# Patient Record
Sex: Male | Born: 1940 | Race: White | Hispanic: No | Smoking: Former smoker
Health system: Southern US, Community
[De-identification: ages and names within clinical notes are randomized; demographics above are authoritative.]

## PROBLEM LIST (undated history)

## (undated) DIAGNOSIS — H409 Unspecified glaucoma: Secondary | ICD-10-CM

## (undated) DIAGNOSIS — E7800 Pure hypercholesterolemia, unspecified: Secondary | ICD-10-CM

## (undated) DIAGNOSIS — C4491 Basal cell carcinoma of skin, unspecified: Secondary | ICD-10-CM

## (undated) DIAGNOSIS — I219 Acute myocardial infarction, unspecified: Secondary | ICD-10-CM

## (undated) DIAGNOSIS — E079 Disorder of thyroid, unspecified: Secondary | ICD-10-CM

## (undated) DIAGNOSIS — H269 Unspecified cataract: Secondary | ICD-10-CM

## (undated) DIAGNOSIS — M1990 Unspecified osteoarthritis, unspecified site: Secondary | ICD-10-CM

## (undated) DIAGNOSIS — R296 Repeated falls: Secondary | ICD-10-CM

## (undated) DIAGNOSIS — I4891 Unspecified atrial fibrillation: Secondary | ICD-10-CM

## (undated) DIAGNOSIS — I10 Essential (primary) hypertension: Secondary | ICD-10-CM

## (undated) DIAGNOSIS — M069 Rheumatoid arthritis, unspecified: Secondary | ICD-10-CM

## (undated) HISTORY — PX: JOINT REPLACEMENT: SHX530

## (undated) HISTORY — PX: CORONARY ANGIOPLASTY WITH STENT PLACEMENT: SHX49

## (undated) HISTORY — DX: Unspecified glaucoma: H40.9

## (undated) HISTORY — PX: REPLACEMENT TOTAL KNEE: SUR1224

## (undated) HISTORY — DX: Basal cell carcinoma of skin, unspecified: C44.91

## (undated) HISTORY — DX: Rheumatoid arthritis, unspecified: M06.9

## (undated) HISTORY — DX: Unspecified cataract: H26.9

---

## 2017-01-02 ENCOUNTER — Encounter: Payer: Self-pay | Admitting: Internal Medicine

## 2017-01-15 ENCOUNTER — Encounter: Payer: Self-pay | Admitting: Nurse Practitioner

## 2017-01-24 ENCOUNTER — Ambulatory Visit: Payer: Self-pay | Admitting: Nurse Practitioner

## 2017-02-01 ENCOUNTER — Ambulatory Visit: Payer: Self-pay | Admitting: Nurse Practitioner

## 2017-02-13 ENCOUNTER — Telehealth: Payer: Self-pay | Admitting: Gastroenterology

## 2017-02-13 ENCOUNTER — Ambulatory Visit: Payer: Self-pay | Admitting: Gastroenterology

## 2017-02-13 ENCOUNTER — Encounter: Payer: Self-pay | Admitting: Gastroenterology

## 2017-02-13 NOTE — Telephone Encounter (Signed)
PATIENT WAS A NO SHOW AND LETTER SENT  °

## 2017-04-01 ENCOUNTER — Emergency Department (HOSPITAL_COMMUNITY)
Admission: EM | Admit: 2017-04-01 | Discharge: 2017-04-01 | Disposition: A | Discharge status: Home / Self Care | Payer: Medicare PPO | Attending: Emergency Medicine | Admitting: Emergency Medicine

## 2017-04-01 ENCOUNTER — Emergency Department (HOSPITAL_COMMUNITY): Payer: Medicare PPO

## 2017-04-01 ENCOUNTER — Encounter (HOSPITAL_COMMUNITY): Payer: Self-pay | Admitting: Emergency Medicine

## 2017-04-01 DIAGNOSIS — Y929 Unspecified place or not applicable: Secondary | ICD-10-CM | POA: Insufficient documentation

## 2017-04-01 DIAGNOSIS — I10 Essential (primary) hypertension: Secondary | ICD-10-CM | POA: Diagnosis not present

## 2017-04-01 DIAGNOSIS — W07XXXA Fall from chair, initial encounter: Secondary | ICD-10-CM | POA: Diagnosis not present

## 2017-04-01 DIAGNOSIS — S42031A Displaced fracture of lateral end of right clavicle, initial encounter for closed fracture: Secondary | ICD-10-CM | POA: Diagnosis not present

## 2017-04-01 DIAGNOSIS — S4991XA Unspecified injury of right shoulder and upper arm, initial encounter: Secondary | ICD-10-CM | POA: Diagnosis present

## 2017-04-01 DIAGNOSIS — Z87891 Personal history of nicotine dependence: Secondary | ICD-10-CM | POA: Insufficient documentation

## 2017-04-01 DIAGNOSIS — Y9389 Activity, other specified: Secondary | ICD-10-CM | POA: Diagnosis not present

## 2017-04-01 DIAGNOSIS — Y999 Unspecified external cause status: Secondary | ICD-10-CM | POA: Diagnosis not present

## 2017-04-01 DIAGNOSIS — S42001A Fracture of unspecified part of right clavicle, initial encounter for closed fracture: Secondary | ICD-10-CM

## 2017-04-01 HISTORY — DX: Acute myocardial infarction, unspecified: I21.9

## 2017-04-01 HISTORY — DX: Essential (primary) hypertension: I10

## 2017-04-01 HISTORY — DX: Unspecified atrial fibrillation: I48.91

## 2017-04-01 HISTORY — DX: Pure hypercholesterolemia, unspecified: E78.00

## 2017-04-01 HISTORY — DX: Unspecified osteoarthritis, unspecified site: M19.90

## 2017-04-01 HISTORY — DX: Disorder of thyroid, unspecified: E07.9

## 2017-04-01 MED ORDER — HYDROCODONE-ACETAMINOPHEN 5-325 MG PO TABS: 1.0000 | ORAL_TABLET | Freq: Four times a day (QID) | ORAL | 0 refills | Status: DC | PRN

## 2017-04-01 NOTE — ED Triage Notes (Signed)
Patient c/o right shoulder, right hip, and low back after falling today trying to get out of chair. Per family chair leg broke. Patient landed on carpeted floor. Denies hitting head or LOC. Patient unable to lift right arm.

## 2017-04-01 NOTE — ED Provider Notes (Signed)
[facility] DEPT Provider Note   CSN: [id number] Arrival date & time: 04/01/17  1543     History   Chief Complaint Chief Complaint  Patient presents with  . Fall    HPI [name] is a 76 y.o. male.  Patient fell out of a chair today complains of pain in his right shoulder lower back   The history is provided by the patient.  Fall  This is a new problem. The current episode started 6 to 12 hours ago. The problem occurs rarely. The problem has been resolved. Pertinent negatives include no chest pain, no abdominal pain and no headaches. Exacerbated by: Movement of shoulder. Nothing relieves the symptoms.    Past Medical History:  Diagnosis Date  . A-fib ([place])   . Arthritis   . High cholesterol   . Hypertension   . MI (myocardial infarction)   . Thyroid disease     There are no active problems to display for this patient.   Past Surgical History:  Procedure Laterality Date  . CORONARY ANGIOPLASTY WITH STENT PLACEMENT    . REPLACEMENT TOTAL KNEE Left        Home Medications    Prior to Admission medications   Medication Sig Start Date End Date Taking? Authorizing Provider  HYDROcodone-acetaminophen (NORCO/VICODIN) 5-325 MG tablet Take 1 tablet by mouth every 6 (six) hours as needed for moderate pain. 04/01/17   Milton Ferguson, MD    Family History History reviewed. No pertinent family history.  Social History Social History  Substance Use Topics  . Smoking status: Former Smoker    Years: 15.00    Types: Cigarettes    Quit date: 12/26/1975  . Smokeless tobacco: Never Used  . Alcohol use No     Allergies   Patient has no known allergies.   Review of Systems Review of Systems  Constitutional: Negative for appetite change and fatigue.  HENT: Negative for congestion, ear discharge and sinus pressure.   Eyes: Negative for discharge.  Respiratory: Negative for cough.   Cardiovascular: Negative for chest pain.  Gastrointestinal: Negative for  abdominal pain and diarrhea.  Genitourinary: Negative for frequency and hematuria.  Musculoskeletal: Negative for back pain.       Right shoulder pain  Skin: Negative for rash.  Neurological: Negative for seizures and headaches.  Psychiatric/Behavioral: Negative for hallucinations.     Physical Exam Updated Vital Signs BP 137/74 (BP Location: Left Arm)   Pulse 73   Temp 98.7 F (37.1 C) (Oral)   Resp 18   Ht 5\' 10"  (1.778 m)   Wt 185 lb (83.9 kg)   SpO2 94%   BMI 26.54 kg/m   Physical Exam  Constitutional: He is oriented to person, place, and time. He appears well-developed.  HENT:  Head: Normocephalic.  Eyes: Conjunctivae and EOM are normal. No scleral icterus.  Neck: Neck supple. No thyromegaly present.  Cardiovascular: Normal rate and regular rhythm.  Exam reveals no gallop and no friction rub.   No murmur heard. Pulmonary/Chest: No stridor. He has no wheezes. He has no rales. He exhibits no tenderness.  Abdominal: He exhibits no distension. There is no tenderness. There is no rebound.  Musculoskeletal: Normal range of motion. He exhibits no edema.  Tender right shoulder neurovascular exam normal in right arm. Mild tenderness lumbar spine  Lymphadenopathy:    He has no cervical adenopathy.  Neurological: He is oriented to person, place, and time. He exhibits normal muscle tone. Coordination normal.  Skin: No rash  noted. No erythema.  Psychiatric: He has a normal mood and affect. His behavior is normal.     ED Treatments / Results  Labs (all labs ordered are listed, but only abnormal results are displayed) Labs Reviewed - No data to display  EKG  EKG Interpretation None       Radiology Dg Lumbar Spine Complete  Result Date: 04/01/2017 CLINICAL DATA:  Fall out of chair today. Low back injury and pain. Initial encounter. EXAM: LUMBAR SPINE - COMPLETE 4+ VIEW COMPARISON:  None. FINDINGS: There is no evidence of acute lumbar spine fracture. Alignment is normal.  Severe degenerative disc disease is seen at L1-2 and L2-3. Mild facet DJD is seen bilaterally at L5-S1. No focal lytic or sclerotic bone lesions identified. Aortic atherosclerosis. Calcified gallstones also noted in the right upper quadrant. IMPRESSION: No acute findings.  Degenerative spondylosis, as described above. Incidentally noted aortic atherosclerosis and cholelithiasis. Electronically Signed   By: Earle Gell M.D.   On: 04/01/2017 16:47   Dg Shoulder Right  Result Date: 04/01/2017 CLINICAL DATA:  Fall.  Right shoulder pain. EXAM: RIGHT SHOULDER - 2+ VIEW COMPARISON:  None. FINDINGS: There is an oblique fracture through the lateral right clavicle, which probably extends to the articular surface, with 7 mm superior displacement of the dominant lateral fracture fragment and surrounding soft tissue swelling. No additional fracture. No evidence of dislocation at the right glenohumeral joint. 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 Small subacromial spur. No suspicious focal osseous lesion. No radiopaque foreign body. IMPRESSION: 1. Displaced fracture of the lateral right clavicle, probably extending laterally to the articular surface. 2. 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 Electronically Signed   By: Ilona Sorrel M.D.   On: 04/01/2017 16:45    Procedures Procedures (including critical care time)  Medications Ordered in ED Medications - No data to display   Initial Impression / Assessment and Plan / ED Course  I have reviewed the triage vital signs and the nursing notes.  Pertinent labs & imaging results that were available during my care of the patient were reviewed by me and considered in my medical decision making (see chart for details).     Patient with clavicle fracture and contusion to lumbar spine. He is given a sling Vicodin and will follow-up with orthopedics  Final Clinical Impressions(s) / ED Diagnoses   Final diagnoses:  Closed displaced  fracture of right clavicle, unspecified part of clavicle, initial encounter    New Prescriptions New Prescriptions   HYDROCODONE-ACETAMINOPHEN (NORCO/VICODIN) 5-325 MG TABLET    Take 1 tablet by mouth every 6 (six) hours as needed for moderate pain.     Milton Ferguson, MD 04/01/17 [phone]

## 2017-04-01 NOTE — Discharge Instructions (Signed)
Follow-up with Dr. Aline Brochure

## 2017-04-04 ENCOUNTER — Encounter: Payer: Self-pay | Admitting: Orthopaedic Surgery

## 2017-04-04 ENCOUNTER — Ambulatory Visit (INDEPENDENT_AMBULATORY_CARE_PROVIDER_SITE_OTHER): Payer: Medicare PPO | Admitting: Orthopaedic Surgery

## 2017-04-04 VITALS — BP 121/68 | HR 60 | Ht 67.0 in | Wt 184.0 lb

## 2017-04-04 DIAGNOSIS — S42001A Fracture of unspecified part of right clavicle, initial encounter for closed fracture: Secondary | ICD-10-CM | POA: Diagnosis not present

## 2017-04-04 NOTE — Progress Notes (Signed)
Subjective:    Patient ID: [name], male    DOB: September 23, 1941, 76 y.o.   MRN: [id number]  HPI He fell at home three days ago and hurt his right shoulder.  He tripped over a chair and had pain in the shoulder.  He was seen in the ER.  X-rays showed fracture of the right clavicle.  He has no other injury.  I have reviewed the x-rays and the report.   Review of Systems  HENT: Negative for congestion.   Respiratory: Negative for cough and shortness of breath.   Cardiovascular: Negative for chest pain and leg swelling.  Endocrine: Positive for cold intolerance.  Musculoskeletal: Positive for arthralgias, gait problem and joint swelling.  Allergic/Immunologic: Positive for environmental allergies.   Past Medical History:  Diagnosis Date  . A-fib ([place])   . Arthritis   . High cholesterol   . Hypertension   . MI (myocardial infarction)   . Rheumatoid arthritis ([place])   . Thyroid disease     Past Surgical History:  Procedure Laterality Date  . CORONARY ANGIOPLASTY WITH STENT PLACEMENT    . REPLACEMENT TOTAL KNEE Left     Current Outpatient Prescriptions on File Prior to Visit  Medication Sig Dispense Refill  . HYDROcodone-acetaminophen (NORCO/VICODIN) 5-325 MG tablet Take 1 tablet by mouth every 6 (six) hours as needed for moderate pain. 20 tablet 0   No current facility-administered medications on file prior to visit.     Social History   Social History  . Marital status: Widowed    Spouse name: N/A  . Number of children: N/A  . Years of education: N/A   Occupational History  . Not on file.   Social History Main Topics  . Smoking status: Former Smoker    Years: 15.00    Types: Cigarettes    Quit date: 12/26/1975  . Smokeless tobacco: Never Used  . Alcohol use No  . Drug use: No  . Sexual activity: Not on file   Other Topics Concern  . Not on file   Social History Narrative  . No narrative on file    Family History  Problem Relation Age of Onset  .  Heart attack Father   . Heart attack Brother     BP 121/68   Pulse 60   Ht 5\' 7"  (1.702 m)   Wt 184 lb (83.5 kg)   BMI 28.82 kg/m      Objective:   Physical Exam  Constitutional: He is oriented to person, place, and time. He appears well-developed and well-nourished.  HENT:  Head: Normocephalic and atraumatic.  Eyes: Conjunctivae and EOM are normal. Pupils are equal, round, and reactive to light.  Neck: Normal range of motion. Neck supple.  Cardiovascular: Normal rate, regular rhythm and intact distal pulses.   Pulmonary/Chest: Effort normal.  Abdominal: Soft.  Musculoskeletal: He exhibits tenderness (Pain right shoulder, no paresthesias, ecchymosis present, ROM not done secondary to fracture, in sling.  NV intact.  Neck and left shoulder negative.).  Neurological: He is alert and oriented to person, place, and time. He has normal reflexes. No cranial nerve deficit. He exhibits normal muscle tone. Coordination normal.  Skin: Skin is warm and dry.  Psychiatric: He has a normal mood and affect. His behavior is normal. Judgment and thought content normal.  Vitals reviewed.         Assessment & Plan:   Encounter Diagnosis  Name Primary?  . Closed fracture of interligamentous part of right clavicle,  initial encounter Yes   Continue the sling.  Sleep semi-erect.  Ice as needed.  Return in two weeks.  X-rays on return.  Call if any problem.  Precautions discussed.  Electronically Signed Sanjuana Kava, MD 4/11/20183:25 PM

## 2017-04-11 DIAGNOSIS — I4891 Unspecified atrial fibrillation: Secondary | ICD-10-CM | POA: Insufficient documentation

## 2017-04-11 DIAGNOSIS — M069 Rheumatoid arthritis, unspecified: Secondary | ICD-10-CM | POA: Insufficient documentation

## 2017-04-11 DIAGNOSIS — I482 Chronic atrial fibrillation, unspecified: Secondary | ICD-10-CM

## 2017-04-11 DIAGNOSIS — M05741 Rheumatoid arthritis with rheumatoid factor of right hand without organ or systems involvement: Secondary | ICD-10-CM

## 2017-04-11 DIAGNOSIS — M05742 Rheumatoid arthritis with rheumatoid factor of left hand without organ or systems involvement: Secondary | ICD-10-CM

## 2017-04-16 ENCOUNTER — Ambulatory Visit (INDEPENDENT_AMBULATORY_CARE_PROVIDER_SITE_OTHER): Payer: Medicare PPO | Admitting: Cardiovascular Disease

## 2017-04-16 ENCOUNTER — Other Ambulatory Visit: Payer: Self-pay | Admitting: Cardiovascular Disease

## 2017-04-16 ENCOUNTER — Ambulatory Visit: Payer: Self-pay | Admitting: Cardiovascular Disease

## 2017-04-16 ENCOUNTER — Encounter: Payer: Self-pay | Admitting: Cardiovascular Disease

## 2017-04-16 VITALS — BP 106/66 | HR 48 | Ht 69.0 in | Wt 183.0 lb

## 2017-04-16 DIAGNOSIS — R600 Localized edema: Secondary | ICD-10-CM | POA: Diagnosis not present

## 2017-04-16 DIAGNOSIS — Z7901 Long term (current) use of anticoagulants: Secondary | ICD-10-CM

## 2017-04-16 DIAGNOSIS — I25118 Atherosclerotic heart disease of native coronary artery with other forms of angina pectoris: Secondary | ICD-10-CM

## 2017-04-16 DIAGNOSIS — I482 Chronic atrial fibrillation, unspecified: Secondary | ICD-10-CM

## 2017-04-16 MED ORDER — POTASSIUM CHLORIDE CRYS ER 20 MEQ PO TBCR: EXTENDED_RELEASE_TABLET | ORAL | 3 refills | Status: DC

## 2017-04-16 MED ORDER — RIVAROXABAN 20 MG PO TABS: 20.0000 mg | ORAL_TABLET | Freq: Every day | ORAL | 3 refills | Status: DC

## 2017-04-16 MED ORDER — FUROSEMIDE 40 MG PO TABS: ORAL_TABLET | ORAL | 3 refills | Status: DC

## 2017-04-16 NOTE — Telephone Encounter (Signed)
Refilled Xarelto, told family to call pcp for thyroid

## 2017-04-16 NOTE — Telephone Encounter (Signed)
Needs refill on Xarelto 20mg  and Levothyroxin 75 mcg sent to [facility] / tg

## 2017-04-16 NOTE — Progress Notes (Signed)
CARDIOLOGY CONSULT NOTE  Patient ID: [name] MRN: [id number] DOB/AGE: July 09, 1941 76 y.o.  Admit date: (Not on file) Primary Physician: Wende Neighbors, MD Referring Physician: [place] Crane  Reason for Consultation: CAD, a fib  HPI: [name] is a 76 y.o. male who is being seen today for the evaluation of CAD and atrial fibrillation at the request of Celene Squibb, MD.   He was previously seen by Dr. Ozzie Hoyle with [facility] in [place], [place].  He has a history of coronary artery disease with remote angioplasty and stenting in 2002 in [place], [place]. He also has a history of atrial fibrillation and near-syncope. He is anticoagulated with Xarelto.   I personally reviewed all relevant office documentation, labs, studies.  Echocardiogram performed on 05/04/16 showed normal left ventricular systolic function and regional wall motion, LVEF 65-70%. There was moderate left atrial dilatation and mild tricuspid regurgitation.  I personally reviewed the ECG performed on 05/26/16 which showed rate controlled atrial fibrillation, 51 bpm. There was possible old septal MI.  The patient denies any symptoms of chest pain, palpitations, shortness of breath, lightheadedness, orthopnea, PND, and syncope.  He recently sustained a right clavicular fracture and has been sitting up in a chair to sleep rather than a bed. Since that time he is developed bilateral leg and feet swelling.  He says he does not get much exercise.     Soc Hx: He moved to [place] from [place] in November 2017. His daughter is here with him today. She is a local high school Music therapist and her husband is the new head football coach at [employer]. His father was a Education officer, environmental in [place].    No Known Allergies  Current Outpatient Prescriptions  Medication Sig Dispense Refill  . acetaminophen (TYLENOL) 650 MG CR tablet Take 650 mg by mouth every 8 (eight) hours as needed for  pain.    . folic acid (FOLVITE) 1 MG tablet Take 1 mg by mouth daily.    Marland Kitchen levothyroxine (SYNTHROID, LEVOTHROID) 75 MCG tablet Take 75 mcg by mouth daily before breakfast.    . methotrexate 2.5 MG tablet Take by mouth as directed. 6 tablets on Sunday    . metoprolol succinate (TOPROL-XL) 25 MG 24 hr tablet Take 25 mg by mouth daily.    . rivaroxaban (XARELTO) 20 MG TABS tablet Take 20 mg by mouth daily with supper.    . simvastatin (ZOCOR) 40 MG tablet Take 40 mg by mouth daily.     No current facility-administered medications for this visit.     Past Medical History:  Diagnosis Date  . A-fib ([place])   . Arthritis   . High cholesterol   . Hypertension   . MI (myocardial infarction) ([facility])   . Rheumatoid arthritis ([place])   . Thyroid disease     Past Surgical History:  Procedure Laterality Date  . CORONARY ANGIOPLASTY WITH STENT PLACEMENT    . REPLACEMENT TOTAL KNEE Left     Social History   Social History  . Marital status: Widowed    Spouse name: N/A  . Number of children: N/A  . Years of education: N/A   Occupational History  . Not on file.   Social History Main Topics  . Smoking status: Former Smoker    Years: 15.00    Types: Cigarettes    Quit date: 12/26/1975  . Smokeless tobacco: Never Used  . Alcohol use No  . Drug use:  No  . Sexual activity: Not on file   Other Topics Concern  . Not on file   Social History Narrative  . No narrative on file     No family history of premature CAD in 1st degree relatives.  Current Meds  Medication Sig  . acetaminophen (TYLENOL) 650 MG CR tablet Take 650 mg by mouth every 8 (eight) hours as needed for pain.  . folic acid (FOLVITE) 1 MG tablet Take 1 mg by mouth daily.  Marland Kitchen levothyroxine (SYNTHROID, LEVOTHROID) 75 MCG tablet Take 75 mcg by mouth daily before breakfast.  . methotrexate 2.5 MG tablet Take by mouth as directed. 6 tablets on Sunday  . metoprolol succinate (TOPROL-XL) 25 MG 24 hr tablet Take 25 mg by mouth  daily.  . rivaroxaban (XARELTO) 20 MG TABS tablet Take 20 mg by mouth daily with supper.  . simvastatin (ZOCOR) 40 MG tablet Take 40 mg by mouth daily.      Review of systems complete and found to be negative unless listed above in HPI    Physical exam Blood pressure 106/66, pulse (!) 48, height 5\' 9"  (1.753 m), weight 183 lb (83 kg), SpO2 97 %. General: NAD Neck: No JVD, no thyromegaly or thyroid nodule.  Lungs: Clear to auscultation bilaterally with normal respiratory effort. CV: Nondisplaced PMI. Regular rate and irregular rhythm, normal S1/S2, no S3, no murmur. 1+ pitting b/l pretibial and dorsal pedal edema.  No carotid bruit.  Bilateral venous varicosities. Abdomen: Soft, nontender, no distention.  Skin: Intact without lesions or rashes.  Neurologic: Alert and oriented x 3.  Psych: Normal affect. Extremities: No clubbing or cyanosis.  Right arm in sling. HEENT: Normal.   ECG: Most recent ECG reviewed.   Labs: No results found for: K, BUN, CREATININE, ALT, TSH, HGB   Lipids: No results found for: LDLCALC, LDLDIRECT, CHOL, TRIG, HDL      ASSESSMENT AND PLAN:  1. CAD with history of PCI: Symptomatically stable. Continue metoprolol and simvastatin. Not on aspirin as he is on Xarelto.  2. Chronic atrial fibrillation: Symptomatically stable with good heart rate control on Toprol-XL 25 mg daily. Anticoagulated with Xarelto 20 mg daily. No changes to therapy.  3. Bilateral leg edema: Likely dependent edema due to venous varicosities. I will prescribe Lasix 40 mg daily with potassium 20 meq daily for 5 days. Thereafter it can be used as needed. I will check a basic metabolic panel this week to monitor K and renal function.  Disposition: Follow up in 3 months  Signed: Kate Sable, M.D., F.A.C.C.  04/16/2017, 2:48 PM

## 2017-04-16 NOTE — Patient Instructions (Signed)
Your physician recommends that you schedule a follow-up appointment in: 3 months Dr.Koneswaran    Take Lasix 40 mg daily for 5 days and then daily AS NEEDED for leg swelling  Take Potassium 20 meq daily for 5 days and then daily AS NEEDED for leg swelling     Get lab work: BMET on Friday, 04/20/17       Thank you for choosing [facility] !

## 2017-04-19 ENCOUNTER — Other Ambulatory Visit (HOSPITAL_COMMUNITY)
Admission: RE | Admit: 2017-04-19 | Discharge: 2017-04-19 | Disposition: A | Discharge status: Home / Self Care | Payer: Medicare PPO | Source: Ambulatory Visit | Attending: Cardiovascular Disease | Admitting: Cardiovascular Disease

## 2017-04-19 ENCOUNTER — Ambulatory Visit (INDEPENDENT_AMBULATORY_CARE_PROVIDER_SITE_OTHER): Payer: Self-pay | Admitting: Orthopaedic Surgery

## 2017-04-19 ENCOUNTER — Encounter: Payer: Self-pay | Admitting: Orthopaedic Surgery

## 2017-04-19 ENCOUNTER — Ambulatory Visit (INDEPENDENT_AMBULATORY_CARE_PROVIDER_SITE_OTHER): Payer: Medicare PPO

## 2017-04-19 DIAGNOSIS — M7989 Other specified soft tissue disorders: Secondary | ICD-10-CM | POA: Insufficient documentation

## 2017-04-19 DIAGNOSIS — S42001D Fracture of unspecified part of right clavicle, subsequent encounter for fracture with routine healing: Secondary | ICD-10-CM | POA: Diagnosis not present

## 2017-04-19 LAB — BASIC METABOLIC PANEL
Anion gap: 10 (ref 5–15)
BUN: 13 mg/dL (ref 6–20)
CALCIUM: 8.9 mg/dL (ref 8.9–10.3)
CHLORIDE: 100 mmol/L — AB (ref 101–111)
CO2: 26 mmol/L (ref 22–32)
CREATININE: 0.94 mg/dL (ref 0.61–1.24)
GFR calc non Af Amer: >60 mL/min (ref >60)
GLUCOSE: 100 mg/dL — AB (ref 65–99)
Potassium: 3.9 mmol/L (ref 3.5–5.1)
Sodium: 136 mmol/L (ref 135–145)

## 2017-04-19 NOTE — Progress Notes (Signed)
CC:  I don't like the sling  He does not like wearing the sling on the right.  He has minimal pain of the right shoulder.  He has no numbness and no new trauma.  ROM is good of the right shoulder with no pain.  NV intact.  X-rays were done of the right clavicle, reported separately.  Encounter Diagnosis  Name Primary?  . Closed fracture of interligamentous part of right clavicle with routine healing, subsequent encounter Yes    He can stop the sling.  Begin ROM exercises.  Return in one month.  Precautions discussed.  Call if any problem.  X-rays on return.  Electronically Signed Sanjuana Kava, MD 4/26/20183:28 PM

## 2017-04-30 ENCOUNTER — Ambulatory Visit (INDEPENDENT_AMBULATORY_CARE_PROVIDER_SITE_OTHER): Payer: Medicare PPO | Admitting: Family Medicine

## 2017-04-30 ENCOUNTER — Encounter: Payer: Self-pay | Admitting: Family Medicine

## 2017-04-30 DIAGNOSIS — I700 Atherosclerosis of aorta: Secondary | ICD-10-CM

## 2017-04-30 DIAGNOSIS — H409 Unspecified glaucoma: Secondary | ICD-10-CM | POA: Insufficient documentation

## 2017-04-30 DIAGNOSIS — I2510 Atherosclerotic heart disease of native coronary artery without angina pectoris: Secondary | ICD-10-CM | POA: Diagnosis not present

## 2017-04-30 DIAGNOSIS — K209 Esophagitis, unspecified without bleeding: Secondary | ICD-10-CM | POA: Insufficient documentation

## 2017-04-30 DIAGNOSIS — E785 Hyperlipidemia, unspecified: Secondary | ICD-10-CM

## 2017-04-30 DIAGNOSIS — M47816 Spondylosis without myelopathy or radiculopathy, lumbar region: Secondary | ICD-10-CM

## 2017-04-30 DIAGNOSIS — R413 Other amnesia: Secondary | ICD-10-CM

## 2017-04-30 DIAGNOSIS — K5790 Diverticulosis of intestine, part unspecified, without perforation or abscess without bleeding: Secondary | ICD-10-CM | POA: Insufficient documentation

## 2017-04-30 DIAGNOSIS — I10 Essential (primary) hypertension: Secondary | ICD-10-CM | POA: Diagnosis not present

## 2017-04-30 DIAGNOSIS — E039 Hypothyroidism, unspecified: Secondary | ICD-10-CM

## 2017-04-30 DIAGNOSIS — R600 Localized edema: Secondary | ICD-10-CM | POA: Insufficient documentation

## 2017-04-30 DIAGNOSIS — N400 Enlarged prostate without lower urinary tract symptoms: Secondary | ICD-10-CM | POA: Insufficient documentation

## 2017-04-30 DIAGNOSIS — K8020 Calculus of gallbladder without cholecystitis without obstruction: Secondary | ICD-10-CM | POA: Insufficient documentation

## 2017-04-30 DIAGNOSIS — K130 Diseases of lips: Secondary | ICD-10-CM

## 2017-04-30 DIAGNOSIS — Z96659 Presence of unspecified artificial knee joint: Secondary | ICD-10-CM | POA: Insufficient documentation

## 2017-04-30 MED ORDER — LEVOTHYROXINE SODIUM 75 MCG PO TABS: 75.0000 ug | ORAL_TABLET | Freq: Every day | ORAL | 3 refills | Status: DC

## 2017-04-30 MED ORDER — FOLIC ACID 1 MG PO TABS: 1.0000 mg | ORAL_TABLET | Freq: Every day | ORAL | 3 refills | Status: DC

## 2017-04-30 MED ORDER — METOPROLOL SUCCINATE ER 25 MG PO TB24: 25.0000 mg | ORAL_TABLET | Freq: Every day | ORAL | 3 refills | Status: DC

## 2017-04-30 MED ORDER — SIMVASTATIN 40 MG PO TABS: 40.0000 mg | ORAL_TABLET | Freq: Every day | ORAL | 3 refills | Status: DC

## 2017-04-30 NOTE — Patient Instructions (Addendum)
Make sure you drink enough water Try to improve your nutrition Need old records PCP and COLONOSCOPY No change in medicine Walk every day that you are able Need local eye doctor  See me in one month

## 2017-04-30 NOTE — Progress Notes (Signed)
Chief Complaint  Patient presents with  . Establish Care  new to establish Incomplete old records Fairly good historian, here with only daughter who remembers most of the rest He seems to be hesitantly and have some short tem memory loss, never diagnosed with stroke or memory impairment or depression. Sees Dr Amil Amen in [facility] for rheumatology Sees Dr Bronson Ing for cardiology Dr [place] Crane is his dermatologist, Dr Sydell Axon is his gastro specialist Dr Luna [place] his orthopedic No current Eye doctor, advised to get one in town  His daughter is concerned because of recurrent falls.  Most recently fell at home and broke his collarbone.  Balance seems poor.  Strength not optimal.    Discussed colon cancer screening after 75 Is up to date with shots  Lives independently and drives Nutrition is poor   Patient Active Problem List   Diagnosis Date Noted  . Essential hypertension 04/30/2017  . Hypothyroid 04/30/2017  . HLD (hyperlipidemia) 04/30/2017  . CAD in native artery 04/30/2017  . 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 Pedal edema 04/30/2017  . Glaucoma 04/30/2017  . Abdominal aortic atherosclerosis ([place]) 04/30/2017  . 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 Asymptomatic gallstones 04/30/2017  . Benign prostatic hyperplasia 04/30/2017  . Diverticulosis 04/30/2017  . Esophagitis 04/30/2017  . A-fib ([place]) 04/11/2017  . Rheumatoid arthritis ([place]) 04/11/2017    Outpatient Encounter Prescriptions as of 04/30/2017  Medication Sig  . acetaminophen (TYLENOL) 650 MG CR tablet Take 650 mg by mouth every 8 (eight) hours as needed for pain.  . folic acid (FOLVITE) 1 MG tablet Take 1 tablet (1 mg total) by mouth daily.  . furosemide (LASIX) 40 MG tablet Take 40 mg daily for 5 days and then As NEEDED for leg swelling  . inFLIXimab (REMICADE) 100 MG injection Inject into the vein.  Marland Kitchen levothyroxine (SYNTHROID, LEVOTHROID) 75 MCG tablet Take 1 tablet (75 mcg total)  by mouth daily before breakfast.  . lisinopril (PRINIVIL,ZESTRIL) 5 MG tablet TAKE ONE TABLET BY MOUTH ONCE DAILY  . methotrexate 2.5 MG tablet Take by mouth as directed. 6 tablets on Sunday  . Methylcellulose, Laxative, 500 MG TABS Take by mouth.  . metoprolol succinate (TOPROL-XL) 25 MG 24 hr tablet Take 1 tablet (25 mg total) by mouth daily.  . potassium chloride SA (K-DUR,KLOR-CON) 20 MEQ tablet Tale Potassium 20 meq daily for 5 days and then daily as NEEDED for leg swelling  . rivaroxaban (XARELTO) 20 MG TABS tablet Take 1 tablet (20 mg total) by mouth daily with supper.  . simvastatin (ZOCOR) 40 MG tablet Take 1 tablet (40 mg total) by mouth daily.  . timolol (TIMOPTIC) 0.5 % ophthalmic solution INSTILL ONE DROP INTO EACH EYE TWICE DAILY   No facility-administered encounter medications on file as of 04/30/2017.     Past Medical History:  Diagnosis Date  . A-fib ([place])   . Arthritis    osteoarthritis  . Cataract   . Glaucoma   . High cholesterol   . Hypertension   . MI (myocardial infarction) ([place])   . Rheumatoid arthritis ([place])   . Thyroid disease     Past Surgical History:  Procedure Laterality Date  . CORONARY ANGIOPLASTY WITH STENT PLACEMENT    . JOINT REPLACEMENT     left knee  . REPLACEMENT TOTAL KNEE Left     Social History   Social History  . Marital status: Widowed    Spouse name: N/A  . Number of children: 1  .  Years of education: 97   Occupational History  . retired     Psychologist, counselling and first aid equip   Social History Main Topics  . Smoking status: Former Smoker    Years: 15.00    Types: Cigarettes    Quit date: 12/26/1975  . Smokeless tobacco: Never Used  . Alcohol use No  . Drug use: No  . Sexual activity: Not Currently   Other Topics Concern  . Not on file   Social History Narrative   Forensic psychologist    Widow   Lives alone   Lives near [name]/daughter    Family History  Problem Relation Age of Onset  . Heart attack Father 67  .  Early death Father   . Alzheimer's disease Mother 34  . Early death Sister     MVA  . Hyperlipidemia Brother   . Heart disease Brother    Review of Systems  Constitutional: Negative for malaise/fatigue and weight loss.  HENT: Positive for hearing loss. Negative for congestion and sinus pain.   Eyes: Negative for blurred vision and redness.  Respiratory: Negative for shortness of breath and wheezing.   Cardiovascular: Positive for leg swelling. Negative for chest pain and palpitations.       Ankles swollen  Not aware of irreg heartbeat  Gastrointestinal: Positive for heartburn. Negative for constipation and diarrhea.  Genitourinary: Negative for frequency and urgency.  Musculoskeletal: Positive for falls and joint pain.  Skin: Negative for rash.  Neurological: Negative for tremors and headaches.       Sometimes lightheaded when standing  Endo/Heme/Allergies: Bruises/bleeds easily.  Psychiatric/Behavioral: Negative for depression. The patient is not nervous/anxious.    BP 118/60 (BP Location: Left Arm, Patient Position: Sitting, Cuff Size: Normal)   Pulse 64   Temp 97.1 F (36.2 C) (Temporal)   Resp 16   Ht 5\' 9"  (1.753 m)   Wt 182 lb 1.9 oz (82.6 kg)   SpO2 98%   BMI 26.89 kg/m   Physical Exam  Constitutional: He is oriented to person, place, and time. He appears well-developed and well-nourished. No distress.  HENT:  Head: Normocephalic and atraumatic.  Mouth/Throat: Oropharynx is clear and moist.  Angular chelitis Many missing teeth  Eyes: Conjunctivae are normal. Pupils are equal, round, and reactive to light.  Neck: Normal range of motion.  Cardiovascular: Normal rate and normal heart sounds.  An irregularly irregular rhythm present.  Pulmonary/Chest: Effort normal and breath sounds normal. He has no rales.  Abdominal: Soft. Bowel sounds are normal.  Musculoskeletal:  Arthritic changes of hands.  Well healed arthroplasty scar R knee.   Lymphadenopathy:    He has  no cervical adenopathy.  Neurological: He is alert and oriented to person, place, and time.  Poor strength and coordination  Psychiatric: He has a normal mood and affect. His behavior is normal.  Hesitant speech.  Looks to daughter.  articluate     ASSESSMENT/PLAN:   1. Essential hypertension  2. Hypothyroidism, unspecified type  3. Hyperlipidemia, unspecified hyperlipidemia type  4. CAD in native artery  5. Status post total knee replacement, unspecified laterality  6. Abdominal aortic atherosclerosis ([place])  7. Osteoarthritis of lumbar spine, unspecified spinal osteoarthritis complication status  8. New to Establish.   Patient Instructions  Make sure you drink enough water Try to improve your nutrition Need old records PCP and COLONOSCOPY No change in medicine Walk every day that you are able Need local eye doctor  See me in one month  Raylene Everts, MD

## 2017-05-01 ENCOUNTER — Encounter: Payer: Self-pay | Admitting: Family Medicine

## 2017-05-01 LAB — VITAMIN B12: Vitamin B-12: 381 pg/mL (ref 200–1100)

## 2017-05-01 LAB — URINALYSIS, ROUTINE W REFLEX MICROSCOPIC
BILIRUBIN URINE: NEGATIVE
Glucose, UA: NEGATIVE
HGB URINE DIPSTICK: NEGATIVE
KETONES UR: NEGATIVE
Leukocytes, UA: NEGATIVE
Nitrite: NEGATIVE
PH: 5.5 (ref 5.0–8.0)
Protein, ur: NEGATIVE
SPECIFIC GRAVITY, URINE: 1.01 (ref 1.001–1.035)

## 2017-05-01 LAB — LIPID PANEL
CHOL/HDL RATIO: 3 ratio (ref <5.0)
Cholesterol: 94 mg/dL (ref <200)
HDL: 31 mg/dL — ABNORMAL LOW (ref >40)
LDL CALC: 48 mg/dL (ref <100)
TRIGLYCERIDES: 73 mg/dL (ref <150)
VLDL: 15 mg/dL (ref <30)

## 2017-05-01 LAB — CBC
HEMATOCRIT: 39.7 % (ref 38.5–50.0)
HEMOGLOBIN: 12.9 g/dL — AB (ref 13.2–17.1)
MCH: 32.9 pg (ref 27.0–33.0)
MCHC: 32.5 g/dL (ref 32.0–36.0)
MCV: 101.3 fL — ABNORMAL HIGH (ref 80.0–100.0)
MPV: 9.6 fL (ref 7.5–12.5)
Platelets: 239 10*3/uL (ref 140–400)
RBC: 3.92 MIL/uL — ABNORMAL LOW (ref 4.20–5.80)
RDW: 14.6 % (ref 11.0–15.0)
WBC: 6.3 10*3/uL (ref 3.8–10.8)

## 2017-05-01 LAB — COMPLETE METABOLIC PANEL WITH GFR
ALBUMIN: 3.4 g/dL — AB (ref 3.6–5.1)
ALT: 10 U/L (ref 9–46)
AST: 17 U/L (ref 10–35)
Alkaline Phosphatase: 52 U/L (ref 40–115)
BUN: 9 mg/dL (ref 7–25)
CALCIUM: 9 mg/dL (ref 8.6–10.3)
CHLORIDE: 103 mmol/L (ref 98–110)
CO2: 28 mmol/L (ref 20–31)
CREATININE: 0.76 mg/dL (ref 0.70–1.18)
GFR, Est African American: >89 mL/min (ref >=60)
GFR, Est Non African American: 89 mL/min (ref >=60)
Glucose, Bld: 94 mg/dL (ref 65–99)
POTASSIUM: 4.4 mmol/L (ref 3.5–5.3)
SODIUM: 138 mmol/L (ref 135–146)
Total Bilirubin: 0.5 mg/dL (ref 0.2–1.2)
Total Protein: 6.9 g/dL (ref 6.1–8.1)

## 2017-05-01 LAB — VITAMIN D 25 HYDROXY (VIT D DEFICIENCY, FRACTURES): Vit D, 25-Hydroxy: 38 ng/mL (ref 30–100)

## 2017-05-17 ENCOUNTER — Ambulatory Visit (INDEPENDENT_AMBULATORY_CARE_PROVIDER_SITE_OTHER): Payer: Medicare PPO

## 2017-05-17 ENCOUNTER — Encounter: Payer: Self-pay | Admitting: Orthopaedic Surgery

## 2017-05-17 ENCOUNTER — Ambulatory Visit (INDEPENDENT_AMBULATORY_CARE_PROVIDER_SITE_OTHER): Payer: Self-pay | Admitting: Orthopaedic Surgery

## 2017-05-17 DIAGNOSIS — S42001D Fracture of unspecified part of right clavicle, subsequent encounter for fracture with routine healing: Secondary | ICD-10-CM | POA: Diagnosis not present

## 2017-05-17 NOTE — Progress Notes (Signed)
CC:  My shoulder is better  He has some tenderness of the right shoulder but is better.  NV intact.  ROM is tender. He has significant degenerative pre-existing changes of the right shoulder.He has no swelling or redness.  X-rays were done of the right shoulder, reported separately.  Encounter Diagnosis  Name Primary?  . Closed fracture of interligamentous part of right clavicle with routine healing, subsequent encounter Yes   Return in one month.  Precautions discussed.  Call if any problem.  X-ray on return.  Electronically Signed Sanjuana Kava, MD 5/24/20183:42 PM

## 2017-05-18 ENCOUNTER — Emergency Department (HOSPITAL_COMMUNITY): Payer: Medicare PPO

## 2017-05-18 ENCOUNTER — Encounter (HOSPITAL_COMMUNITY): Payer: Self-pay | Admitting: *Deleted

## 2017-05-18 ENCOUNTER — Emergency Department (HOSPITAL_COMMUNITY)
Admission: EM | Admit: 2017-05-18 | Discharge: 2017-05-18 | Disposition: A | Discharge status: Home / Self Care | Payer: Medicare PPO | Attending: Emergency Medicine | Admitting: Emergency Medicine

## 2017-05-18 DIAGNOSIS — W010XXA Fall on same level from slipping, tripping and stumbling without subsequent striking against object, initial encounter: Secondary | ICD-10-CM | POA: Diagnosis not present

## 2017-05-18 DIAGNOSIS — Y9301 Activity, walking, marching and hiking: Secondary | ICD-10-CM | POA: Diagnosis not present

## 2017-05-18 DIAGNOSIS — S0003XA Contusion of scalp, initial encounter: Secondary | ICD-10-CM

## 2017-05-18 DIAGNOSIS — Y92009 Unspecified place in unspecified non-institutional (private) residence as the place of occurrence of the external cause: Secondary | ICD-10-CM | POA: Insufficient documentation

## 2017-05-18 DIAGNOSIS — I2510 Atherosclerotic heart disease of native coronary artery without angina pectoris: Secondary | ICD-10-CM | POA: Insufficient documentation

## 2017-05-18 DIAGNOSIS — Z23 Encounter for immunization: Secondary | ICD-10-CM | POA: Diagnosis not present

## 2017-05-18 DIAGNOSIS — Z79899 Other long term (current) drug therapy: Secondary | ICD-10-CM | POA: Insufficient documentation

## 2017-05-18 DIAGNOSIS — W19XXXA Unspecified fall, initial encounter: Secondary | ICD-10-CM

## 2017-05-18 DIAGNOSIS — I10 Essential (primary) hypertension: Secondary | ICD-10-CM | POA: Diagnosis not present

## 2017-05-18 DIAGNOSIS — S0990XA Unspecified injury of head, initial encounter: Secondary | ICD-10-CM

## 2017-05-18 DIAGNOSIS — Y999 Unspecified external cause status: Secondary | ICD-10-CM | POA: Diagnosis not present

## 2017-05-18 DIAGNOSIS — Z87891 Personal history of nicotine dependence: Secondary | ICD-10-CM | POA: Insufficient documentation

## 2017-05-18 DIAGNOSIS — S60511A Abrasion of right hand, initial encounter: Secondary | ICD-10-CM | POA: Diagnosis not present

## 2017-05-18 HISTORY — DX: Repeated falls: R29.6

## 2017-05-18 MED ORDER — TETANUS-DIPHTH-ACELL PERTUSSIS 5-2.5-18.5 LF-MCG/0.5 IM SUSP
0.5000 mL | Freq: Once | INTRAMUSCULAR | Status: AC
  Administered 2017-05-18: 0.5 mL via INTRAMUSCULAR
  Filled 2017-05-18: qty 0.5

## 2017-05-18 NOTE — ED Notes (Signed)
Signature pad would not let pt sign out at discharge.

## 2017-05-18 NOTE — ED Provider Notes (Signed)
[facility] DEPT Provider Note   CSN: [id number] Arrival date & time: 05/18/17  1849     History   Chief Complaint Chief Complaint  Patient presents with  . Fall    HPI [name] is a 76 y.o. male.  HPI  Pt was seen at [facility]. Per pt, c/o sudden onset and resolution of one episode of slip and fall that occurred PTA. Pt states he fell while walking into his home. States he tried to grab a bar that is attached to his home to catch himself with his right hand, but he wound up falling backwards and hitting his head. Pt c/o head injury and right hand abrasions. Denies syncope/LOC, no AMS, no visual changes, no neck or back pain, no prodromal symptoms before fall, no CP/palpitations, no cough/SOB, no abd pain, no N/V/D, no focal motor weakness, no tingling/numbness in extremities.    Past Medical History:  Diagnosis Date  . A-fib ([place])   . Arthritis    osteoarthritis  . Cataract   . Frequent falls   . Glaucoma   . High cholesterol   . Hypertension   . MI (myocardial infarction) ([place])   . Rheumatoid arthritis ([place])   . Thyroid disease     Patient Active Problem List   Diagnosis Date Noted  . Essential hypertension 04/30/2017  . Hypothyroid 04/30/2017  . HLD (hyperlipidemia) 04/30/2017  . CAD in native artery 04/30/2017  . 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 Pedal edema 04/30/2017  . Glaucoma 04/30/2017  . Abdominal aortic atherosclerosis ([place]) 04/30/2017  . 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 Asymptomatic gallstones 04/30/2017  . Benign prostatic hyperplasia 04/30/2017  . Diverticulosis 04/30/2017  . Esophagitis 04/30/2017  . Angular cheilitis 04/30/2017  . Mild memory disturbance 04/30/2017  . A-fib ([place]) 04/11/2017  . Rheumatoid arthritis ([place]) 04/11/2017    Past Surgical History:  Procedure Laterality Date  . CORONARY ANGIOPLASTY WITH STENT PLACEMENT    . JOINT REPLACEMENT     left knee  . REPLACEMENT TOTAL KNEE Left         Home Medications    Prior to Admission medications   Medication Sig Start Date End Date Taking? Authorizing Provider  acetaminophen (TYLENOL) 650 MG CR tablet Take 650 mg by mouth every 8 (eight) hours as needed for pain.   Yes [provider]  folic acid (FOLVITE) 1 MG tablet Take 1 tablet (1 mg total) by mouth daily. 04/30/17  Yes Raylene Everts, MD  furosemide (LASIX) 40 MG tablet Take 40 mg daily for 5 days and then As NEEDED for leg swelling Patient taking differently: Take 40 mg by mouth daily as needed for fluid or edema. As NEEDED for leg swelling 04/16/17  Yes Herminio Commons, MD  inFLIXimab (REMICADE) 100 MG injection Inject into the vein every 8 (eight) weeks.  02/24/15  Yes [provider]  levothyroxine (SYNTHROID, LEVOTHROID) 75 MCG tablet Take 1 tablet (75 mcg total) by mouth daily before breakfast. 04/30/17  Yes Raylene Everts, MD  lisinopril (PRINIVIL,ZESTRIL) 5 MG tablet TAKE ONE TABLET BY MOUTH ONCE DAILY 08/09/16  Yes [provider]  methotrexate 2.5 MG tablet Take 15 mg by mouth every Sunday. 6 tablets on Sunday   Yes [provider]  metoprolol succinate (TOPROL-XL) 25 MG 24 hr tablet Take 1 tablet (25 mg total) by mouth daily. 04/30/17  Yes Raylene Everts, MD  polycarbophil (FIBERCON) 625 MG tablet Take 625 mg by mouth  at bedtime.   Yes [provider]  potassium chloride SA (K-DUR,KLOR-CON) 20 MEQ tablet Tale Potassium 20 meq daily for 5 days and then daily as NEEDED for leg swelling Patient taking differently: Take 20 mEq by mouth daily as needed. as NEEDED for leg swelling 04/16/17  Yes Herminio Commons, MD  rivaroxaban (XARELTO) 20 MG TABS tablet Take 1 tablet (20 mg total) by mouth daily with supper. Patient taking differently: Take 20 mg by mouth every morning.  04/16/17  Yes Herminio Commons, MD  simvastatin (ZOCOR) 40 MG tablet Take 1 tablet (40 mg total) by mouth daily. 04/30/17  Yes Raylene Everts, MD  timolol (TIMOPTIC) 0.5 % ophthalmic solution INSTILL ONE DROP INTO EACH EYE TWICE DAILY 12/15/13  Yes [provider]    Family History Family History  Problem Relation Age of Onset  . Heart attack Father 63  . Early death Father   . Alzheimer's disease Mother 93  . Early death Sister        MVA  . Hyperlipidemia Brother   . Heart disease Brother     Social History Social History  Substance Use Topics  . Smoking status: Former Smoker    Years: 15.00    Types: Cigarettes    Quit date: 12/26/1975  . Smokeless tobacco: Never Used  . Alcohol use No     Allergies   Patient has no known allergies.   Review of Systems Review of Systems ROS: Statement: All systems negative except as marked or noted in the HPI; Constitutional: Negative for fever and chills. ; ; Eyes: Negative for eye pain, redness and discharge. ; ; ENMT: Negative for ear pain, hoarseness, nasal congestion, sinus pressure and sore throat. ; ; Cardiovascular: Negative for chest pain, palpitations, diaphoresis, dyspnea and peripheral edema. ; ; Respiratory: Negative for cough, wheezing and stridor. ; ; Gastrointestinal: Negative for nausea, vomiting, diarrhea, abdominal pain, blood in stool, hematemesis, jaundice and rectal bleeding. . ; ; Genitourinary: Negative for dysuria, flank pain and hematuria. ; ; Musculoskeletal: +head injury. Negative for back pain and neck pain. Negative for swelling and deformity.; ; Skin: +right hand abrasions. Negative for pruritus, rash, blisters, bruising and skin lesion.; ; Neuro: Negative for headache, lightheadedness and neck stiffness. Negative for weakness, altered level of consciousness, altered mental status, extremity weakness, paresthesias, involuntary movement, seizure and syncope.      Physical Exam Updated Vital Signs BP (!) 147/68   Pulse (!) 54   Temp 98 F (36.7 C)   Resp 20   Ht 5\' 10"  (1.778 m)   Wt 81.6 kg (180 lb)   SpO2 98%   BMI 25.83 kg/m    20:41:31 Visual Acuity GP  Visual Acuity  Bilateral Near: 20  Bilateral Distance: 40  R Near: 20  R Distance: 40  L Near: 20  L Distance: 40     Physical Exam 1935: Physical examination:  Nursing notes reviewed; Vital signs and O2 SAT reviewed;  Constitutional: Well developed, Well nourished, Well hydrated, In no acute distress; Head:  Normocephalic, +right parietal scalp hematoma, no lacerations.; Eyes: EOMI, PERRL, No scleral icterus; ENMT: Mouth and pharynx normal, Mucous membranes moist; Neck: Supple, Full range of motion, No lymphadenopathy; Cardiovascular: Regular rate and rhythm, No gallop; Respiratory: Breath sounds clear & equal bilaterally, No wheezes.  Speaking full sentences with ease, Normal respiratory effort/excursion; Chest: Nontender, Movement normal; Abdomen: Soft, Nontender, Nondistended, Normal bowel sounds; Genitourinary: No CVA tenderness; Spine:  No midline CS,  TS, LS tenderness.;; Extremities: Pelvis stable. +right dorsal hand and palmar hand with one small superficial abrasion on each side. NT right shoulder/elbows/wrist/hand. Pulses normal, No deformity. No edema, No calf edema or asymmetry.; Neuro: AA&Ox3, Major CN grossly intact. No facial droop.  Speech clear. No gross focal motor or sensory deficits in extremities. Climbs on and off stretcher easily by himself. Gait per baseline per daughter at bedside.; Skin: Color normal, Warm, Dry.   ED Treatments / Results  Labs (all labs ordered are listed, but only abnormal results are displayed)   EKG  EKG Interpretation None       Radiology   Procedures Procedures (including critical care time)  Medications Ordered in ED Medications  Tdap (BOOSTRIX) injection 0.5 mL (0.5 mLs Intramuscular Given 05/18/17 2055)     Initial Impression / Assessment and Plan / ED Course  I have reviewed the triage vital signs and the nursing notes.  Pertinent labs & imaging results that were available during my care of  the patient were reviewed by me and considered in my medical decision making (see chart for details).  MDM Reviewed: previous chart, nursing note and vitals Interpretation: CT scan    Ct Head Wo Contrast Result Date: 05/18/2017 CLINICAL DATA:  [place] and hit back of head EXAM: CT HEAD WITHOUT CONTRAST CT CERVICAL SPINE WITHOUT CONTRAST TECHNIQUE: Multidetector CT imaging of the head and cervical spine was performed following the standard protocol without intravenous contrast. Multiplanar CT image reconstructions of the cervical spine were also generated. COMPARISON:  None. FINDINGS: CT HEAD FINDINGS Brain: No acute territorial infarction, hemorrhage or intracranial mass is seen. Mild atrophy. Ventricle size within normal limits. Vascular: No hyperdense vessels.  Carotid artery calcifications. Skull: No fracture or suspicious bone lesions Sinuses/Orbits: Complete opacification of right maxillary sinus with bony remottling. Mucosal thickening in the ethmoid sinuses. No acute orbital abnormality. Bilateral lens extraction. Other: Large right parietal scalp hematoma. CT CERVICAL SPINE FINDINGS Alignment: Trace retrolisthesis of C3 on C4 and C4 on C5. Facet alignment within normal limits. Skull base and vertebrae: Craniovertebral junction is intact. There is no fracture identified Soft tissues and spinal canal: No prevertebral fluid or swelling. No visible canal hematoma. Disc levels: Multilevel degenerative disc changes, moderate to marked at C3-C4, C4-C5, C5-C6 and C6-C7. Multilevel bilateral facet arthropathy with multilevel foraminal stenosis between C3 and C6. Upper chest: Lung apices clear.  No thyroid mass. Other: None IMPRESSION: 1. Large right parietal scalp hematoma. No definite CT evidence for acute intracranial abnormality 2. Trace retrolisthesis of C3 on C4 and C4 on C5, probably degenerative. No acute fracture. Multilevel degenerative disc changes. Electronically Signed   By: Donavan Foil M.D.   On:  05/18/2017 20:43   Ct Cervical Spine Wo Contrast Result Date: 05/18/2017 CLINICAL DATA:  [place] and hit back of head EXAM: CT HEAD WITHOUT CONTRAST CT CERVICAL SPINE WITHOUT CONTRAST TECHNIQUE: Multidetector CT imaging of the head and cervical spine was performed following the standard protocol without intravenous contrast. Multiplanar CT image reconstructions of the cervical spine were also generated. COMPARISON:  None. FINDINGS: CT HEAD FINDINGS Brain: No acute territorial infarction, hemorrhage or intracranial mass is seen. Mild atrophy. Ventricle size within normal limits. Vascular: No hyperdense vessels.  Carotid artery calcifications. Skull: No fracture or suspicious bone lesions Sinuses/Orbits: Complete opacification of right maxillary sinus with bony remottling. Mucosal thickening in the ethmoid sinuses. No acute orbital abnormality. Bilateral lens extraction. Other: Large right parietal scalp hematoma. CT CERVICAL SPINE FINDINGS Alignment: Trace retrolisthesis  of C3 on C4 and C4 on C5. Facet alignment within normal limits. Skull base and vertebrae: Craniovertebral junction is intact. There is no fracture identified Soft tissues and spinal canal: No prevertebral fluid or swelling. No visible canal hematoma. Disc levels: Multilevel degenerative disc changes, moderate to marked at C3-C4, C4-C5, C5-C6 and C6-C7. Multilevel bilateral facet arthropathy with multilevel foraminal stenosis between C3 and C6. Upper chest: Lung apices clear.  No thyroid mass. Other: None IMPRESSION: 1. Large right parietal scalp hematoma. No definite CT evidence for acute intracranial abnormality 2. Trace retrolisthesis of C3 on C4 and C4 on C5, probably degenerative. No acute fracture. Multilevel degenerative disc changes. Electronically Signed   By: Donavan Foil M.D.   On: 05/18/2017 20:43    2135:  Pt states he "feels fine" and wants to go home now. Does not want any further imaging studies or dx testing and wants to leave  now. Pt has ambulated with his baseline gait, easy resps, NAD. Td updated and wounds cleaned/dressed. CT reassuring. Dx and testing d/w pt and family.  Questions answered.  Verb understanding, agreeable to d/c home with outpt f/u.   Final Clinical Impressions(s) / ED Diagnoses   Final diagnoses:  None    New Prescriptions New Prescriptions   No medications on file     Francine Graven, DO 05/23/17 1521

## 2017-05-18 NOTE — ED Notes (Signed)
Pt's abrasion to thumb on R hand cleaned with saline and non-stick dressing applied and then wrapped with small Kling.

## 2017-05-18 NOTE — ED Notes (Signed)
Pt refused XRAY of R hand.

## 2017-05-18 NOTE — ED Notes (Signed)
Pt ambulated to perform visual acuity. Pt with unsteady gait and states he experiences frequent dizziness.

## 2017-05-18 NOTE — Discharge Instructions (Signed)
Take your usual prescriptions as previously directed.  Wash the abraded areas with soap and water at least twice a day, and cover with a clean/dry dressing.  Change the dressing whenever it becomes wet or soiled after washing the area with soap and water.  Call your regular medical doctor on Tuesday to schedule a follow up appointment within the next 3 days.  Return to the Emergency Department immediately if worsening.

## 2017-05-18 NOTE — ED Triage Notes (Signed)
Fell at home, hit the back of his head

## 2017-05-18 NOTE — ED Notes (Signed)
Visual Acuity performed with pt's corrective lenses in place.

## 2017-05-18 NOTE — ED Notes (Signed)
Pt states he fell and hit head on concrete driveway. Pt has knot to scalp area and small laceration to thumb of R. Hand. Pt alert and oriented x 3.

## 2017-05-28 ENCOUNTER — Encounter: Payer: Self-pay | Admitting: Family Medicine

## 2017-05-28 ENCOUNTER — Ambulatory Visit (INDEPENDENT_AMBULATORY_CARE_PROVIDER_SITE_OTHER): Payer: Medicare PPO | Admitting: Family Medicine

## 2017-05-28 ENCOUNTER — Telehealth: Payer: Self-pay | Admitting: Family Medicine

## 2017-05-28 VITALS — BP 116/72 | HR 60 | Temp 96.9°F | Resp 16 | Ht 70.0 in | Wt 171.0 lb

## 2017-05-28 DIAGNOSIS — R296 Repeated falls: Secondary | ICD-10-CM

## 2017-05-28 DIAGNOSIS — E039 Hypothyroidism, unspecified: Secondary | ICD-10-CM | POA: Diagnosis not present

## 2017-05-28 DIAGNOSIS — R2681 Unsteadiness on feet: Secondary | ICD-10-CM

## 2017-05-28 DIAGNOSIS — E785 Hyperlipidemia, unspecified: Secondary | ICD-10-CM

## 2017-05-28 DIAGNOSIS — I10 Essential (primary) hypertension: Secondary | ICD-10-CM | POA: Diagnosis not present

## 2017-05-28 NOTE — Patient Instructions (Signed)
Go to PT for gait instruction and exercises  You may crush all of your pills Bring pill bottles next time  STOP the fiber pill If you need fiber, eat fiber cereal or get metamucil powder at the pharmacy  Drink plenty of water  [place] when you are ill, hurt, or fall  See me in one month Need 40 min next time

## 2017-05-28 NOTE — Progress Notes (Signed)
Chief Complaint  Patient presents with  . Fall    2-3 weeks ago   Has had another significant fall and ER visit Discussed referral to PT Discussed use of life line  He almost choked on a large fiber pill yesterday He can crush his other meds and put in applesauce Stop the fiber pill and get metamucil powder  BP well controlled Labs reviewed.  Cholesterol is LOW   Patient Active Problem List   Diagnosis Date Noted  . Essential hypertension 04/30/2017  . Hypothyroid 04/30/2017  . HLD (hyperlipidemia) 04/30/2017  . CAD in native artery 04/30/2017  . 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 Pedal edema 04/30/2017  . Glaucoma 04/30/2017  . Abdominal aortic atherosclerosis ([place]) 04/30/2017  . 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 Asymptomatic gallstones 04/30/2017  . Benign prostatic hyperplasia 04/30/2017  . Diverticulosis 04/30/2017  . Esophagitis 04/30/2017  . Angular cheilitis 04/30/2017  . Mild memory disturbance 04/30/2017  . A-fib ([place]) 04/11/2017  . Rheumatoid arthritis ([place]) 04/11/2017    Outpatient Encounter Prescriptions as of 05/28/2017  Medication Sig  . acetaminophen (TYLENOL) 650 MG CR tablet Take 650 mg by mouth every 8 (eight) hours as needed for pain.  . folic acid (FOLVITE) 1 MG tablet Take 1 tablet (1 mg total) by mouth daily.  . furosemide (LASIX) 40 MG tablet Take 40 mg daily for 5 days and then As NEEDED for leg swelling (Patient taking differently: Take 40 mg by mouth daily as needed for fluid or edema. As NEEDED for leg swelling)  . inFLIXimab (REMICADE) 100 MG injection Inject into the vein every 8 (eight) weeks.   [place] Kitchen levothyroxine (SYNTHROID, LEVOTHROID) 75 MCG tablet Take 1 tablet (75 mcg total) by mouth daily before breakfast.  . lisinopril (PRINIVIL,ZESTRIL) 5 MG tablet TAKE ONE TABLET BY MOUTH ONCE DAILY  . methotrexate 2.5 MG tablet Take 15 mg by mouth every Sunday. 6 tablets on Sunday  . metoprolol succinate  (TOPROL-XL) 25 MG 24 hr tablet Take 1 tablet (25 mg total) by mouth daily.  . polycarbophil (FIBERCON) 625 MG tablet Take 625 mg by mouth at bedtime.  . potassium chloride SA (K-DUR,KLOR-CON) 20 MEQ tablet Tale Potassium 20 meq daily for 5 days and then daily as NEEDED for leg swelling (Patient taking differently: Take 20 mEq by mouth daily as needed. as NEEDED for leg swelling)  . rivaroxaban (XARELTO) 20 MG TABS tablet Take 1 tablet (20 mg total) by mouth daily with supper. (Patient taking differently: Take 20 mg by mouth every morning. )  . simvastatin (ZOCOR) 40 MG tablet Take 1 tablet (40 mg total) by mouth daily.  . timolol (TIMOPTIC) 0.5 % ophthalmic solution INSTILL ONE DROP INTO EACH EYE TWICE DAILY   No facility-administered encounter medications on file as of 05/28/2017.     No Known Allergies  Review of Systems  Constitutional: Negative for appetite change, fatigue and unexpected weight change.  HENT: Negative for congestion and dental problem.   Eyes: Negative for photophobia and visual disturbance.  Respiratory: Negative for cough and shortness of breath.   Cardiovascular: Negative for chest pain, palpitations and leg swelling.  Gastrointestinal: Negative for constipation and diarrhea.  Genitourinary: Negative for difficulty urinating and frequency.  Musculoskeletal: Positive for back pain and gait problem.       Low back pain since fall  Skin:       Bruise on head and neck  Neurological: Positive for weakness. Negative for dizziness  and light-headedness.  Psychiatric/Behavioral: Negative for behavioral problems and dysphoric mood. The patient is not nervous/anxious.       BP 116/72 (BP Location: Left Arm, Patient Position: Sitting, Cuff Size: Normal)   Pulse 60   Temp (!) 96.9 F (36.1 C) (Temporal)   Resp 16   Ht 5\' 10"  (1.778 m)   Wt 171 lb 0.6 oz (77.6 kg)   SpO2 98%   BMI 24.54 kg/m   Physical Exam  Constitutional: He is oriented to person, place, and time.  He appears well-developed and well-nourished. No distress.  Using cane  HENT:  Head: Normocephalic and atraumatic.  Right Ear: External ear normal.  Left Ear: External ear normal.  Nose: Nose normal.  Mouth/Throat: Oropharynx is clear and moist.  Eyes: Conjunctivae are normal. Pupils are equal, round, and reactive to light.  Neck: Normal range of motion.    Cardiovascular: Normal rate, regular rhythm and normal heart sounds.   Pulmonary/Chest: Effort normal and breath sounds normal. He has no rales.  Musculoskeletal: Normal range of motion. He exhibits no edema.  Unsteady gait  Lymphadenopathy:    He has no cervical adenopathy.  Neurological: He is alert and oriented to person, place, and time.  Psychiatric: He has a normal mood and affect. His behavior is normal.  Slight blank face, hesitant    ASSESSMENT/PLAN:  1. Essential hypertension controlled  2. Hypothyroidism, unspecified type On correct replacement  3. Hyperlipidemia, unspecified hyperlipidemia type   Ref. Range 04/30/2017 16:23  Cholesterol Latest Ref Range: <200 mg/dL 94  HDL Cholesterol Latest Ref Range: >40 mg/dL 31 (L)  LDL (calc) Latest Ref Range: <100 mg/dL 48  Triglycerides Latest Ref Range: <150 mg/dL 73  VLDL Latest Ref Range: <30 mg/dL 15    4. Gait instability  - Ambulatory referral to Physical Therapy  5. Falls frequently  - Ambulatory referral to Physical Therapy   Patient Instructions  Go to PT for gait instruction and exercises  You may crush all of your pills Bring pill bottles next time  STOP the fiber pill If you need fiber, eat fiber cereal or get metamucil powder at the pharmacy  Drink plenty of water  [place] when you are ill, hurt, or fall  See me in one month Need 40 min next time   Raylene Everts, MD

## 2017-05-28 NOTE — Telephone Encounter (Signed)
Patient's daughter Bishop Limbo calling wanting to speak with you prior to patient's appt today.  She states it is very important that she speak with you before you see him since she will not be attending the visit today.    [phone].

## 2017-05-28 NOTE — Telephone Encounter (Signed)
Called [name], did not answer, I left her a voice mail to call office.

## 2017-06-01 ENCOUNTER — Ambulatory Visit: Payer: Medicare PPO | Admitting: Family Medicine

## 2017-06-14 ENCOUNTER — Ambulatory Visit (INDEPENDENT_AMBULATORY_CARE_PROVIDER_SITE_OTHER): Payer: Self-pay | Admitting: Orthopaedic Surgery

## 2017-06-14 ENCOUNTER — Ambulatory Visit (INDEPENDENT_AMBULATORY_CARE_PROVIDER_SITE_OTHER): Payer: Medicare PPO

## 2017-06-14 ENCOUNTER — Encounter: Payer: Self-pay | Admitting: Orthopaedic Surgery

## 2017-06-14 ENCOUNTER — Ambulatory Visit: Payer: Medicare PPO

## 2017-06-14 DIAGNOSIS — S42001D Fracture of unspecified part of right clavicle, subsequent encounter for fracture with routine healing: Secondary | ICD-10-CM

## 2017-06-14 NOTE — Progress Notes (Signed)
CC:  My shoulder does not hurt  He has no pain in the right shoulder.    NV is intact.  He has near full ROM of the right shoulder.  X-rays were done and reported separately of the right clavicle.  Encounter Diagnosis  Name Primary?  . Closed fracture of interligamentous part of right clavicle with routine healing, subsequent encounter Yes   I will see him as needed.  Call if any problem.  Precautions discussed.   Electronically Signed Sanjuana Kava, MD 6/21/20182:17 PM

## 2017-06-19 ENCOUNTER — Ambulatory Visit (HOSPITAL_COMMUNITY): Payer: Medicare PPO | Attending: Family Medicine | Admitting: Physical Therapy

## 2017-06-19 DIAGNOSIS — R2681 Unsteadiness on feet: Secondary | ICD-10-CM | POA: Diagnosis present

## 2017-06-19 DIAGNOSIS — M6281 Muscle weakness (generalized): Secondary | ICD-10-CM

## 2017-06-19 DIAGNOSIS — R296 Repeated falls: Secondary | ICD-10-CM | POA: Insufficient documentation

## 2017-06-19 NOTE — Therapy (Signed)
[place], [place], [address] Phone: [phone]   Fax:  [phone]  Physical Therapy Evaluation  Patient Details  Name: [name] MRN: [id number] Date of Birth: 19-Oct-1941 Referring Provider: Blanchie Serve   Encounter Date: 06/19/2017      PT End of Session - 06/19/17 1208    Visit Number 1   Number of Visits 8   Date for PT Re-Evaluation 07/19/17   Authorization Type [employer] medicare   Authorization - Visit Number 1   Authorization - Number of Visits 8   PT Start Time 3818   PT Stop Time 1210   PT Time Calculation (min) 55 min   Activity Tolerance Patient tolerated treatment well   Behavior During Therapy [facility] for tasks assessed/performed      Past Medical History:  Diagnosis Date  . A-fib ([place])   . Arthritis    osteoarthritis  . Cataract   . Frequent falls   . Glaucoma   . High cholesterol   . Hypertension   . MI (myocardial infarction) ([place])   . Rheumatoid arthritis ([place])   . Thyroid disease     Past Surgical History:  Procedure Laterality Date  . CORONARY ANGIOPLASTY WITH STENT PLACEMENT    . JOINT REPLACEMENT     left knee  . REPLACEMENT TOTAL KNEE Left     There were no vitals filed for this visit.       Subjective Assessment - 06/19/17 1119    Subjective [name] states that over the last year he has began to fall.  He has fallen several times usually when he is in a hurry.  He went to his MD who has referred him to physical therapy    Pertinent History DJD lumbar, HTN, CAD, HTN; fx collar bone.    How long can you sit comfortably? no problem    How long can you stand comfortably? not sure    How long can you walk comfortably? household ambulator witn cane   Patient Stated Goals not to fall    Currently in Pain? Yes  will not be addressing this    Pain Score 3    Pain Location Back   Pain Orientation Right   Pain Descriptors / Indicators Aching   Pain Type Chronic  pain   Pain Onset More than a month ago   Pain Frequency Constant   Aggravating Factors  activity   Pain Relieving Factors tylenol             [facility] PT Assessment - 06/19/17 0001      Assessment   Medical Diagnosis unsteadiness on feet; falls    Referring Provider Blanchie Serve    Onset Date/Surgical Date --  03/25/2017   Prior Therapy none      Precautions   Precautions Fall     Restrictions   Weight Bearing Restrictions No     Balance Screen   Has the patient fallen in the past 6 months Yes   How many times? 2   Has the patient had a decrease in activity level because of a fear of falling?  Yes   Is the patient reluctant to leave their home because of a fear of falling?  No     Home Environment   Living Environment Private residence     Prior Function   Level of Independence Independent with community mobility with device   Leisure grandchildren sports events  Cognition   Overall Cognitive Status Within Functional Limits for tasks assessed     Observation/Other Assessments   Focus on Therapeutic Outcomes (FOTO)  86     Functional Tests   Functional tests Single leg stance;Sit to Stand     Single Leg Stance   Comments B 5 seconds   Tandem stance 30 seconds but unsteady      Sit to Stand   Comments 5x  16.01     ROM / Strength   AROM / PROM / Strength Strength     Strength   Strength Assessment Site Hip;Knee;Ankle   Right/Left Hip Right;Left   Right Hip Flexion 3-/5   Right Hip Extension 2/5   Right Hip ABduction 2+/5   Left Hip Flexion 3-/5   Left Hip Extension 2/5   Left Hip ABduction 2+/5   Right/Left Knee Right;Left   Right Knee Extension 5/5   Left Knee Extension 5/5   Right/Left Ankle Right;Left   Right Ankle Dorsiflexion 5/5   Right Ankle Plantar Flexion 4-/5   Left Ankle Dorsiflexion 5/5   Left Ankle Plantar Flexion 5/5            Objective measurements completed on examination: See above findings.          [place]  Adult PT Treatment/Exercise - 06/19/17 0001      Exercises   Exercises Knee/Hip     Knee/Hip Exercises: Standing   SLS x3  B   Other Standing Knee Exercises tandem stance x 2      Knee/Hip Exercises: Supine   Bridges 10 reps   Other Supine Knee/Hip Exercises bent knee lift x 5      Knee/Hip Exercises: Sidelying   Hip ABduction Both;5 reps                  PT Short Term Goals - 06/19/17 1214      PT SHORT TERM GOAL #1   Title Pt to be able to single leg stance on both LE for at least 10 seconds to reduce risk of falling    Time 2   Period Weeks     PT SHORT TERM GOAL #2   Title Pt LE strength to improve by 1/2 grade to be able to walk up and down 8 steps in a reciprocal manner without difficulty    Time 3   Period Weeks   Status New     PT SHORT TERM GOAL #3   Title Pt to have had no falls in the past week    Time 3   Period Weeks   Status New           PT Long Term Goals - 06/19/17 1215      PT LONG TERM GOAL #1   Title Pt to be able to single leg balance on both legs for 15 seconds or greater to feel confident walking on uneven terrain.    Time 4   Period Weeks   Status New     PT LONG TERM GOAL #2   Title Pt strength of LE to be improved by 1 grade to allow pt to be able to get in and out of a low car without difficulty    Time 4   Period Weeks   Status New     PT LONG TERM GOAL #3   Title Pt to have no falls for the past 3 weeks    Time 4   Period Weeks  Status New     PT LONG TERM GOAL #4   Title Pt to be able to go up and down his steps with groceries in his hands in a reciprocal manner    Time 4   Period Weeks   Status New                Plan - 06/19/17 1208-04-03    Clinical Impression Statement [name] is a 76 yo male who has noticed that in the past year he has been falling on a frequent basis.  He went to his MD who referred him to physical therapy.  Evaluation demonstrates decreased balance, decreased strength,  decreased activity tolerance and increased back pain.  [name] will benefit from skilled physical therapy to address the above issues and improve his safety.    History and Personal Factors relevant to plan of care: DJD lumbar spine, Htn CAD,    Clinical Presentation Evolving   Clinical Decision Making Moderate   Rehab Potential Good   PT Frequency 2x / week   PT Duration 4 weeks   PT Treatment/Interventions ADLs/Self Care Home Management;Neuromuscular re-education;Gait training;Stair training;Functional mobility training;Therapeutic activities;Therapeutic exercise;Balance training   PT Next Visit Plan Begin tandem, retro gait, side stepping, cone rotation, progress to foam, hurdles, sit to stands.    PT Home Exercise Plan heel raises, bent knee raise, bridge, sidelying abduction, SLS    Consulted and Agree with Plan of Care Patient      Patient will benefit from skilled therapeutic intervention in order to improve the following deficits and impairments:  Abnormal gait, Decreased activity tolerance, Decreased balance, Pain, Decreased strength  Visit Diagnosis: Repeated falls - Plan: PT plan of care cert/re-cert  Muscle weakness (generalized) - Plan: PT plan of care cert/re-cert  Unsteadiness on feet - Plan: PT plan of care cert/re-cert      G-Codes - 21/30/86 1217/04/03    Functional Assessment Tool Used (Outpatient Only) foto, SLS, hx of falls    Functional Limitation Changing and maintaining body position   Changing and Maintaining Body Position Current Status (V7846) At least 20 percent but less than 40 percent impaired, limited or restricted   Changing and Maintaining Body Position Goal Status (N6295) At least 1 percent but less than 20 percent impaired, limited or restricted       Problem List Patient Active Problem List   Diagnosis Date Noted  . Essential hypertension 04/30/2017  . Hypothyroid 04/30/2017  . HLD (hyperlipidemia) 04/30/2017  . CAD in native artery  04/30/2017  . 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 Pedal edema 04/30/2017  . Glaucoma 04/30/2017  . Abdominal aortic atherosclerosis ([place]) 04/30/2017  . 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 Asymptomatic gallstones 04/30/2017  . Benign prostatic hyperplasia 04/30/2017  . Diverticulosis 04/30/2017  . Esophagitis 04/30/2017  . Angular cheilitis 04/30/2017  . Mild memory disturbance 04/30/2017  . A-fib ([place]) 04/11/2017  . Rheumatoid arthritis [facility]) 04/11/2017    [name], PT CLT [phone] 06/19/2017, 12:22 PM  [place] [address] [place], [place], [address] Phone: [phone]   Fax:  [phone]  Name: [name] MRN: [id number] Date of Birth: 1941/12/20

## 2017-06-19 NOTE — Patient Instructions (Addendum)
Heel Raise: Bilateral (Standing)   At [employer] with your hands on the counter  Rise on balls of feet. Repeat ___10_ times per set. Do ___2 x a day  http://orth.exer.us/38   Copyright  VHI. All rights reserved.  Balance: Unilateral   At kitchen counter  Attempt to balance on left leg, eyes open. Hold _5-15___ seconds. Repeat __3-5__ times per set. Do _1___ sets per session. Do __2__ sessions per day. Perform exercise with eyes closed.  http://orth.exer.us/28   Copyright  VHI. All rights reserved.  Isometric Abdominal    Lying on back with knees bent, tighten stomach by pulling lower abdominals tight. Hold _5___ seconds. Repeat _5-10___ times per set. Do _1___ sets per session. Do __5__ sessions per day.  http://orth.exer.us/1086   Copyright  VHI. All rights reserved.  Bridging    Slowly raise buttocks from floor, keeping stomach tight. Repeat _10___ times per set. Do _1___ sets per session. Do __2__ sessions per day.  http://orth.exer.us/1096   Copyright  VHI. All rights reserved.  Bent Leg Lift (Hook-Lying)    Tighten stomach and slowly raise right leg __3__ inches from floor. Keep trunk rigid. Hold __5_ seconds. Repeat _10___ times per set. Do _1___ sets per session. Do ___2_ sessions per day.  http://orth.exer.us/1090   Copyright  VHI. All rights reserved.  Strengthening: Hip Abduction (Side-Lying)    Tighten muscles on front of left thigh, then lift leg ___10_ inches from surface, keeping knee locked.  Repeat _5-10___ times per set. Do _1___ sets per session. Do __2__ sessions per day.  http://orth.exer.us/622   Copyright  VHI. All rights reserved.

## 2017-06-22 ENCOUNTER — Ambulatory Visit (HOSPITAL_COMMUNITY): Payer: Medicare PPO

## 2017-06-22 DIAGNOSIS — M6281 Muscle weakness (generalized): Secondary | ICD-10-CM

## 2017-06-22 DIAGNOSIS — R2681 Unsteadiness on feet: Secondary | ICD-10-CM

## 2017-06-22 DIAGNOSIS — R296 Repeated falls: Secondary | ICD-10-CM | POA: Diagnosis not present

## 2017-06-22 NOTE — Therapy (Signed)
[place], [place], [address] Phone: [phone]   Fax:  [phone]  Physical Therapy Treatment  Patient Details  Name: [name] MRN: [id number] Date of Birth: January 23, 1941 Referring Provider: Blanchie Serve   Encounter Date: 06/22/2017      PT End of Session - 06/22/17 1037    Visit Number 2   Number of Visits 8   Date for PT Re-Evaluation 07/19/17   Authorization Type [employer] medicare   Authorization - Visit Number 2   Authorization - Number of Visits 8   PT Start Time 3790   PT Stop Time 1118   PT Time Calculation (min) 44 min   Equipment Utilized During Treatment Gait belt   Activity Tolerance Patient tolerated treatment well;No increased pain   Behavior During Therapy WFL for tasks assessed/performed      Past Medical History:  Diagnosis Date  . A-fib ([place])   . Arthritis    osteoarthritis  . Cataract   . Frequent falls   . Glaucoma   . High cholesterol   . Hypertension   . MI (myocardial infarction) ([place])   . Rheumatoid arthritis ([place])   . Thyroid disease     Past Surgical History:  Procedure Laterality Date  . CORONARY ANGIOPLASTY WITH STENT PLACEMENT    . JOINT REPLACEMENT     left knee  . REPLACEMENT TOTAL KNEE Left     There were no vitals filed for this visit.      Subjective Assessment - 06/22/17 1034    Subjective Pt reports he has began HEP with no questions concerning exercises.  No reoprts of recent falls since last session.  Report Rt side LBP intermittent pain scale 2/40 with certain movements   Pertinent History DJD lumbar, HTN, CAD, HTN; fx collar bone.    Patient Stated Goals not to fall    Currently in Pain? Yes   Pain Score 3    Pain Location Back   Pain Orientation Lower;Right   Pain Descriptors / Indicators Sharp  with certain movements   Pain Type Chronic pain   Pain Onset More than a month ago   Pain Frequency Intermittent   Aggravating Factors   activity   Pain Relieving Factors tylenol                         [facility] Adult PT Treatment/Exercise - 06/22/17 0001      Knee/Hip Exercises: Standing   Heel Raises 15 reps   Heel Raises Limitations toe raises   SLS x3  B     Knee/Hip Exercises: Supine   Bridges 10 reps   Other Supine Knee/Hip Exercises bent knee raise 10x alternating     Knee/Hip Exercises: Sidelying   Hip ABduction Both;10 reps   Hip ABduction Limitations cueing for form             Balance Exercises - 06/22/17 1300      Balance Exercises: Standing   Tandem Stance Eyes open;3 reps;30 secs;Foam/compliant surface  1st set on floor then progressed to foam   SLS Eyes open;5 reps  Lt 7", Rt 15"    Tandem Gait 2 reps   Retro Gait 2 reps   Sidestepping 2 reps;Theraband  GTB   Cone Rotation Foam/compliant surface;R/L           PT Education - 06/22/17 1535    Education provided Yes   Education Details Reviewed goals, reviewed  technique wiht HEP and cueing to improve form, copy of eval given to pt   Person(s) Educated Patient   Methods Explanation;Demonstration;Handout;Verbal cues   Comprehension Verbalized understanding;Returned demonstration;Need further instruction          PT Short Term Goals - 06/19/17 1214      PT SHORT TERM GOAL #1   Title Pt to be able to single leg stance on both LE for at least 10 seconds to reduce risk of falling    Time 2   Period Weeks     PT SHORT TERM GOAL #2   Title Pt LE strength to improve by 1/2 grade to be able to walk up and down 8 steps in a reciprocal manner without difficulty    Time 3   Period Weeks   Status New     PT SHORT TERM GOAL #3   Title Pt to have had no falls in the past week    Time 3   Period Weeks   Status New           PT Long Term Goals - 06/19/17 1215      PT LONG TERM GOAL #1   Title Pt to be able to single leg balance on both legs for 15 seconds or greater to feel confident walking on uneven terrain.     Time 4   Period Weeks   Status New     PT LONG TERM GOAL #2   Title Pt strength of LE to be improved by 1 grade to allow pt to be able to get in and out of a low car without difficulty    Time 4   Period Weeks   Status New     PT LONG TERM GOAL #3   Title Pt to have no falls for the past 3 weeks    Time 4   Period Weeks   Status New     PT LONG TERM GOAL #4   Title Pt to be able to go up and down his steps with groceries in his hands in a reciprocal manner    Time 4   Period Weeks   Status New               Plan - 06/22/17 1042    Clinical Impression Statement Reviewed goals, reviewed form and technique with HEP to assure compliance, copy of eval given to pt.  Pt unable to remember HEP without instructions and required verbal and tactile cueing for proper from especially with sidelying exercise, encouraged to increase frequency for maximal benefits.  Progressed to balance activities with min guard/A and cueing to improve posutre to assist with balance.  Added more dynamic surface activiites next session.     Rehab Potential Good   PT Frequency 2x / week   PT Duration 4 weeks   PT Treatment/Interventions ADLs/Self Care Home Management;Neuromuscular re-education;Gait training;Stair training;Functional mobility training;Therapeutic activities;Therapeutic exercise;Balance training   PT Next Visit Plan Review compliance wiht HEP, continue sidelying abduction to assure correct form at home.  Next session progress to foam, hurdles, sit to stands.    PT Home Exercise Plan heel raises, bent knee raise, bridge, sidelying abduction, SLS       Patient will benefit from skilled therapeutic intervention in order to improve the following deficits and impairments:  Abnormal gait, Decreased activity tolerance, Decreased balance, Pain, Decreased strength  Visit Diagnosis: Repeated falls  Muscle weakness (generalized)  Unsteadiness on feet     Problem  List Patient Active  Problem List   Diagnosis Date Noted  . Essential hypertension 04/30/2017  . Hypothyroid 04/30/2017  . HLD (hyperlipidemia) 04/30/2017  . CAD in native artery 04/30/2017  . 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 Pedal edema 04/30/2017  . Glaucoma 04/30/2017  . Abdominal aortic atherosclerosis ([place]) 04/30/2017  . 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 Asymptomatic gallstones 04/30/2017  . Benign prostatic hyperplasia 04/30/2017  . Diverticulosis 04/30/2017  . Esophagitis 04/30/2017  . Angular cheilitis 04/30/2017  . Mild memory disturbance 04/30/2017  . A-fib ([place]) 04/11/2017  . Rheumatoid arthritis ([place]) 04/11/2017   Ihor Austin, [place]; [place]  Aldona Lento 06/22/2017, 3:37 PM  [place] [address] [place], [place], [address] Phone: [phone]   Fax:  [phone]  Name: Takai Chiaramonte MRN: [id number] Date of Birth: 08-Mar-1941

## 2017-06-25 ENCOUNTER — Ambulatory Visit (HOSPITAL_COMMUNITY): Payer: Medicare PPO | Attending: Family Medicine | Admitting: Physical Therapy

## 2017-06-25 DIAGNOSIS — R296 Repeated falls: Secondary | ICD-10-CM | POA: Insufficient documentation

## 2017-06-25 DIAGNOSIS — M6281 Muscle weakness (generalized): Secondary | ICD-10-CM | POA: Diagnosis present

## 2017-06-25 DIAGNOSIS — R2681 Unsteadiness on feet: Secondary | ICD-10-CM | POA: Insufficient documentation

## 2017-06-25 NOTE — Therapy (Signed)
[place], [place], [address] Phone: [phone]   Fax:  [phone]  Physical Therapy Treatment  Patient Details  Name: [name] MRN: [id number] Date of Birth: March 23, 1941 Referring Provider: Blanchie Serve   Encounter Date: 06/25/2017      PT End of Session - 06/25/17 1430    Visit Number 3   Number of Visits 8   Date for PT Re-Evaluation 07/19/17   Authorization Type [employer] medicare   Authorization - Visit Number 3   Authorization - Number of Visits 8   PT Start Time 8786   PT Stop Time 1428   PT Time Calculation (min) 40 min   Equipment Utilized During Treatment Gait belt   Activity Tolerance Patient tolerated treatment well   Behavior During Therapy [facility] for tasks assessed/performed      Past Medical History:  Diagnosis Date  . A-fib ([place])   . Arthritis    osteoarthritis  . Cataract   . Frequent falls   . Glaucoma   . High cholesterol   . Hypertension   . MI (myocardial infarction) ([place])   . Rheumatoid arthritis ([place])   . Thyroid disease     Past Surgical History:  Procedure Laterality Date  . CORONARY ANGIOPLASTY WITH STENT PLACEMENT    . JOINT REPLACEMENT     left knee  . REPLACEMENT TOTAL KNEE Left     There were no vitals filed for this visit.      Subjective Assessment - 06/25/17 1352    Subjective patient arrives stating he is doing well, no major changes or falls since last time    Pertinent History DJD lumbar, HTN, CAD, HTN; fx collar bone.    Patient Stated Goals not to fall    Currently in Pain? No/denies                         [facility] Adult PT Treatment/Exercise - 06/25/17 0001      Knee/Hip Exercises: Standing   Heel Raises 1 set;20 reps   Heel Raises Limitations heel and toe    Lateral Step Up Both;1 set;10 reps   Lateral Step Up Limitations 6 inch box    Forward Step Up Both;1 set;10 reps   Forward Step Up Limitations 6 inch box               Balance Exercises - 06/25/17 1429      Balance Exercises: Standing   Tandem Stance Eyes open;Foam/compliant surface;3 reps;20 secs   SLS Eyes open;Solid surface;3 reps;10 secs;15 secs   Cone Rotation Foam/compliant surface;Right turn;Left turn;R/L  cones, cross midline    Other Standing Exercises cone toe taps 4 cones combo of 3 solid surface ; standing marches on foam cues for form no HHA            PT Education - 06/25/17 1430    Education provided No          PT Short Term Goals - 06/19/17 1214      PT SHORT TERM GOAL #1   Title Pt to be able to single leg stance on both LE for at least 10 seconds to reduce risk of falling    Time 2   Period Weeks     PT SHORT TERM GOAL #2   Title Pt LE strength to improve by 1/2 grade to be able to walk up and down 8 steps in a reciprocal manner  without difficulty    Time 3   Period Weeks   Status New     PT SHORT TERM GOAL #3   Title Pt to have had no falls in the past week    Time 3   Period Weeks   Status New           PT Long Term Goals - 06/19/17 1215      PT LONG TERM GOAL #1   Title Pt to be able to single leg balance on both legs for 15 seconds or greater to feel confident walking on uneven terrain.    Time 4   Period Weeks   Status New     PT LONG TERM GOAL #2   Title Pt strength of LE to be improved by 1 grade to allow pt to be able to get in and out of a low car without difficulty    Time 4   Period Weeks   Status New     PT LONG TERM GOAL #3   Title Pt to have no falls for the past 3 weeks    Time 4   Period Weeks   Status New     PT LONG TERM GOAL #4   Title Pt to be able to go up and down his steps with groceries in his hands in a reciprocal manner    Time 4   Period Weeks   Status New               Plan - 06/25/17 1431    Clinical Impression Statement Began session by working on functional strength inside of parallel bars today, followed by work on balance strategies  within parallel bars as well. Patient very pleasant but requires cues for form and safety. Min guard provided throughout session for general safety today.     Rehab Potential Good   PT Frequency 2x / week   PT Duration 4 weeks   PT Treatment/Interventions ADLs/Self Care Home Management;Neuromuscular re-education;Gait training;Stair training;Functional mobility training;Therapeutic activities;Therapeutic exercise;Balance training   PT Next Visit Plan promote HEP compliance, sidelying hip ABD. Progress to hurdles next session, sit to stands, increase difficulty of other balance tasks    PT Home Exercise Plan heel raises, bent knee raise, bridge, sidelying abduction, SLS    Consulted and Agree with Plan of Care Patient      Patient will benefit from skilled therapeutic intervention in order to improve the following deficits and impairments:  Abnormal gait, Decreased activity tolerance, Decreased balance, Pain, Decreased strength  Visit Diagnosis: Repeated falls  Muscle weakness (generalized)  Unsteadiness on feet     Problem List Patient Active Problem List   Diagnosis Date Noted  . Essential hypertension 04/30/2017  . Hypothyroid 04/30/2017  . HLD (hyperlipidemia) 04/30/2017  . CAD in native artery 04/30/2017  . 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 Pedal edema 04/30/2017  . Glaucoma 04/30/2017  . Abdominal aortic atherosclerosis ([place]) 04/30/2017  . 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 Asymptomatic gallstones 04/30/2017  . Benign prostatic hyperplasia 04/30/2017  . Diverticulosis 04/30/2017  . Esophagitis 04/30/2017  . Angular cheilitis 04/30/2017  . Mild memory disturbance 04/30/2017  . A-fib ([place]) 04/11/2017  . Rheumatoid arthritis ([place]) 04/11/2017    Deniece Ree PT, DPT [phone]  [place] [address] [place], [place], [address] Phone: [phone]   Fax:  [phone]  Name:  [name] MRN: [id number] Date of Birth: 12-12-1941

## 2017-06-29 ENCOUNTER — Ambulatory Visit (HOSPITAL_COMMUNITY): Payer: Medicare PPO

## 2017-06-29 ENCOUNTER — Encounter (HOSPITAL_COMMUNITY): Payer: Self-pay

## 2017-06-29 VITALS — BP 128/78

## 2017-06-29 DIAGNOSIS — R296 Repeated falls: Secondary | ICD-10-CM

## 2017-06-29 DIAGNOSIS — M6281 Muscle weakness (generalized): Secondary | ICD-10-CM

## 2017-06-29 DIAGNOSIS — R2681 Unsteadiness on feet: Secondary | ICD-10-CM

## 2017-06-29 NOTE — Therapy (Signed)
[place], [place], [address] Phone: [phone]   Fax:  [phone]  Physical Therapy Treatment  Patient Details  Name: [name] MRN: [id number] Date of Birth: [birth year] Referring Provider: Blanchie Serve   Encounter Date: 06/29/2017      PT End of Session - 06/29/17 1128    Visit Number 4   Number of Visits 8   Date for PT Re-Evaluation 07/19/17   Authorization Type [employer] medicare   Authorization - Visit Number 4   Authorization - Number of Visits 8   PT Start Time 1120   PT Stop Time 1205   PT Time Calculation (min) 45 min   Equipment Utilized During Treatment Gait belt   Activity Tolerance Patient tolerated treatment well   Behavior During Therapy [facility] for tasks assessed/performed      Past Medical History:  Diagnosis Date  . A-fib ([place])   . Arthritis    osteoarthritis  . Cataract   . Frequent falls   . Glaucoma   . High cholesterol   . Hypertension   . MI (myocardial infarction) ([place])   . Rheumatoid arthritis ([place])   . Thyroid disease     Past Surgical History:  Procedure Laterality Date  . CORONARY ANGIOPLASTY WITH STENT PLACEMENT    . JOINT REPLACEMENT     left knee  . REPLACEMENT TOTAL KNEE Left     Vitals:   06/29/17 1214  BP: 128/78        Subjective Assessment - 06/29/17 1126    Subjective Pt stated he is doing well today, no reports of pain or recent falls.  Reports complaince iwht HEP without questions             [facility] Adult PT Treatment/Exercise - 06/29/17 0001      Knee/Hip Exercises: Standing   Heel Raises 1 set;20 reps   Heel Raises Limitations heel and toe    Forward Lunges 15 reps;Both   Forward Lunges Limitations 6in step     Knee/Hip Exercises: Seated   Sit to Sand 10 reps;without UE support             Balance Exercises - 06/29/17 1139      Balance Exercises: Standing   Tandem Stance Eyes open;Foam/compliant surface;3 reps;30 secs   3rd set complete with head turns on foam no HHA   SLS Eyes open;Solid surface;3 reps  Rt 12", Lt 8" max of 3   Sidestepping 2 reps;Theraband  RTB   Step Over Hurdles / Cones 6in hurdles 2Rt forward and lateral   [facility] Rotation Foam/compliant surface;Right turn;Left turn             PT Short Term Goals - 06/19/17 1214      PT SHORT TERM GOAL #1   Title Pt to be able to single leg stance on both LE for at least 10 seconds to reduce risk of falling    Time 2   Period Weeks     PT SHORT TERM GOAL #2   Title Pt LE strength to improve by 1/2 grade to be able to walk up and down 8 steps in a reciprocal manner without difficulty    Time 3   Period Weeks   Status New     PT SHORT TERM GOAL #3   Title Pt to have had no falls in the past week    Time 3   Period Weeks   Status New  PT Long Term Goals - 06/19/17 1215      PT LONG TERM GOAL #1   Title Pt to be able to single leg balance on both legs for 15 seconds or greater to feel confident walking on uneven terrain.    Time 4   Period Weeks   Status New     PT LONG TERM GOAL #2   Title Pt strength of LE to be improved by 1 grade to allow pt to be able to get in and out of a low car without difficulty    Time 4   Period Weeks   Status New     PT LONG TERM GOAL #3   Title Pt to have no falls for the past 3 weeks    Time 4   Period Weeks   Status New     PT LONG TERM GOAL #4   Title Pt to be able to go up and down his steps with groceries in his hands in a reciprocal manner    Time 4   Period Weeks   Status New               Plan - 06/29/17 1209    Clinical Impression Statement Progressed functional strengthening and balance training with new activiites.  Added lunges for LE functional strengthening and increased difficulty with balance activities today.  Added hurdles and head turns in tandem stance, min A for safety and cueing for form with activities.  Pt did c/o dizziness during lateral  hurdles, pt had a seat, given water and BP taken at 124/78 mmHg.   Rehab Potential Good   PT Frequency 2x / week   PT Duration 4 weeks   PT Treatment/Interventions ADLs/Self Care Home Management;Neuromuscular re-education;Gait training;Stair training;Functional mobility training;Therapeutic activities;Therapeutic exercise;Balance training   PT Next Visit Plan Resume tandem and retro gait next session, progress to foam when able.  Continue functional strengthening with lunges, step training and sit to stand.  Increase difficulty of other balance tasks    PT Home Exercise Plan heel raises, bent knee raise, bridge, sidelying abduction, SLS       Patient will benefit from skilled therapeutic intervention in order to improve the following deficits and impairments:  Abnormal gait, Decreased activity tolerance, Decreased balance, Pain, Decreased strength  Visit Diagnosis: Repeated falls  Muscle weakness (generalized)  Unsteadiness on feet     Problem List Patient Active Problem List   Diagnosis Date Noted  . Essential hypertension 04/30/2017  . Hypothyroid 04/30/2017  . HLD (hyperlipidemia) 04/30/2017  . CAD in native artery 04/30/2017  . 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 Pedal edema 04/30/2017  . Glaucoma 04/30/2017  . Abdominal aortic atherosclerosis ([place]) 04/30/2017  . 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 Asymptomatic gallstones 04/30/2017  . Benign prostatic hyperplasia 04/30/2017  . Diverticulosis 04/30/2017  . Esophagitis 04/30/2017  . Angular cheilitis 04/30/2017  . Mild memory disturbance 04/30/2017  . A-fib ([place]) 04/11/2017  . Rheumatoid arthritis ([place]) 04/11/2017   Ihor Austin, [place]; [place]  Aldona Lento 06/29/2017, 12:17 PM  [place] [address] [place], [place], [address] Phone: [phone]   Fax:  [phone]  Name: [name] MRN: [id number] Date  of Birth: [birth year]

## 2017-07-03 ENCOUNTER — Ambulatory Visit (HOSPITAL_COMMUNITY): Payer: Medicare PPO | Admitting: Physical Therapy

## 2017-07-03 DIAGNOSIS — R2681 Unsteadiness on feet: Secondary | ICD-10-CM

## 2017-07-03 DIAGNOSIS — M6281 Muscle weakness (generalized): Secondary | ICD-10-CM

## 2017-07-03 DIAGNOSIS — R296 Repeated falls: Secondary | ICD-10-CM

## 2017-07-03 NOTE — Therapy (Signed)
[place], [place], [address] Phone: [phone]   Fax:  [phone]  Physical Therapy Treatment  Patient Details  Name: [name] MRN: [id number] Date of Birth: 1941/08/29 Referring Provider: Blanchie Serve   Encounter Date: 07/03/2017      PT End of Session - 07/03/17 1112    Visit Number 5   Number of Visits 8   Date for PT Re-Evaluation 07/19/17   Authorization Type [employer] medicare   Authorization - Visit Number 5   Authorization - Number of Visits 8   PT Start Time 1033   PT Stop Time 1111   PT Time Calculation (min) 38 min   Equipment Utilized During Treatment Gait belt   Activity Tolerance Patient tolerated treatment well   Behavior During Therapy [facility] for tasks assessed/performed      Past Medical History:  Diagnosis Date  . A-fib ([place])   . Arthritis    osteoarthritis  . Cataract   . Frequent falls   . Glaucoma   . High cholesterol   . Hypertension   . MI (myocardial infarction) ([place])   . Rheumatoid arthritis ([place])   . Thyroid disease     Past Surgical History:  Procedure Laterality Date  . CORONARY ANGIOPLASTY WITH STENT PLACEMENT    . JOINT REPLACEMENT     left knee  . REPLACEMENT TOTAL KNEE Left     There were no vitals filed for this visit.      Subjective Assessment - 07/03/17 1031    Subjective Pt has no concerns   Pertinent History DJD lumbar, HTN, CAD, HTN; fx collar bone.    Patient Stated Goals not to fall    Currently in Pain? No/denies                              Balance Exercises - 07/03/17 1031      Balance Exercises: Standing   Tandem Stance Eyes open;5 reps  head turns ; nods    SLS Eyes open;5 reps   Wall Bumps Shoulder   Wall Bumps-Shoulders Eyes opened;10 reps   Tandem Gait Forward;Foam/compliant surface;2 reps   Retro Gait 2 reps   Sidestepping 2 reps;Theraband  RTB   Step Over Hurdles / Cones 6 inch x 2 RT    Marching  Limitations 10   Heel Raises Limitations 10   Toe Raise Limitations 10   Sit to Stand Time 10   Other Standing Exercises hip extension x 10 B             PT Short Term Goals - 06/19/17 1214      PT SHORT TERM GOAL #1   Title Pt to be able to single leg stance on both LE for at least 10 seconds to reduce risk of falling    Time 2   Period Weeks     PT SHORT TERM GOAL #2   Title Pt LE strength to improve by 1/2 grade to be able to walk up and down 8 steps in a reciprocal manner without difficulty    Time 3   Period Weeks   Status New     PT SHORT TERM GOAL #3   Title Pt to have had no falls in the past week    Time 3   Period Weeks   Status New           PT Long Term  Goals - 06/19/17 1215      PT LONG TERM GOAL #1   Title Pt to be able to single leg balance on both legs for 15 seconds or greater to feel confident walking on uneven terrain.    Time 4   Period Weeks   Status New     PT LONG TERM GOAL #2   Title Pt strength of LE to be improved by 1 grade to allow pt to be able to get in and out of a low car without difficulty    Time 4   Period Weeks   Status New     PT LONG TERM GOAL #3   Title Pt to have no falls for the past 3 weeks    Time 4   Period Weeks   Status New     PT LONG TERM GOAL #4   Title Pt to be able to go up and down his steps with groceries in his hands in a reciprocal manner    Time 4   Period Weeks   Status New               Plan - 07/03/17 1112    Clinical Impression Statement Added retrowalking, wall bumps and hip extension exercises with good form with verbal cuing.  Tandem stance remains challenging with head turns therefore foam was not used.    Rehab Potential Good   PT Frequency 2x / week   PT Duration 4 weeks   PT Treatment/Interventions ADLs/Self Care Home Management;Neuromuscular re-education;Gait training;Stair training;Functional mobility training;Therapeutic activities;Therapeutic exercise;Balance training    PT Next Visit Plan .  Continue functional strengthening with lunges, step training     PT Home Exercise Plan heel raises, bent knee raise, bridge, sidelying abduction, SLS       Patient will benefit from skilled therapeutic intervention in order to improve the following deficits and impairments:  Abnormal gait, Decreased activity tolerance, Decreased balance, Pain, Decreased strength  Visit Diagnosis: Repeated falls  Muscle weakness (generalized)  Unsteadiness on feet     Problem List Patient Active Problem List   Diagnosis Date Noted  . Essential hypertension 04/30/2017  . Hypothyroid 04/30/2017  . HLD (hyperlipidemia) 04/30/2017  . CAD in native artery 04/30/2017  . 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 Pedal edema 04/30/2017  . Glaucoma 04/30/2017  . Abdominal aortic atherosclerosis ([place]) 04/30/2017  . 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 Asymptomatic gallstones 04/30/2017  . Benign prostatic hyperplasia 04/30/2017  . Diverticulosis 04/30/2017  . Esophagitis 04/30/2017  . Angular cheilitis 04/30/2017  . Mild memory disturbance 04/30/2017  . A-fib ([place]) 04/11/2017  . Rheumatoid arthritis [facility]) 04/11/2017    [name], PT CLT [phone] 07/03/2017, 11:14 AM  [place] [address] [place], [place], [address] Phone: [phone]   Fax:  [phone]  Name: [name] MRN: [id number] Date of Birth: December 25, 1941

## 2017-07-04 ENCOUNTER — Telehealth (HOSPITAL_COMMUNITY): Payer: Self-pay | Admitting: Physical Therapy

## 2017-07-04 NOTE — Telephone Encounter (Signed)
Pt agreed to move to this apptment time. NF 07/04/17

## 2017-07-05 ENCOUNTER — Ambulatory Visit (HOSPITAL_COMMUNITY): Payer: Medicare PPO

## 2017-07-05 DIAGNOSIS — R296 Repeated falls: Secondary | ICD-10-CM

## 2017-07-05 DIAGNOSIS — R2681 Unsteadiness on feet: Secondary | ICD-10-CM

## 2017-07-05 DIAGNOSIS — M6281 Muscle weakness (generalized): Secondary | ICD-10-CM

## 2017-07-05 NOTE — Therapy (Signed)
[place], [place], [address] Phone: [phone]   Fax:  [phone]  Physical Therapy Treatment  Patient Details  Name: [name] MRN: [id number] Date of Birth: April 03, 1941 Referring Provider: Blanchie Serve   Encounter Date: 07/05/2017      PT End of Session - 07/05/17 1042    Visit Number 6   Number of Visits 8   Date for PT Re-Evaluation 07/19/17   Authorization Type [employer] medicare   Authorization - Visit Number 6   Authorization - Number of Visits 8   PT Start Time 6073   PT Stop Time 7106   PT Time Calculation (min) 40 min   Equipment Utilized During Treatment Gait belt   Activity Tolerance Patient tolerated treatment well;No increased pain   Behavior During Therapy WFL for tasks assessed/performed      Past Medical History:  Diagnosis Date  . A-fib ([place])   . Arthritis    osteoarthritis  . Cataract   . Frequent falls   . Glaucoma   . High cholesterol   . Hypertension   . MI (myocardial infarction) ([place])   . Rheumatoid arthritis ([place])   . Thyroid disease     Past Surgical History:  Procedure Laterality Date  . CORONARY ANGIOPLASTY WITH STENT PLACEMENT    . JOINT REPLACEMENT     left knee  . REPLACEMENT TOTAL KNEE Left     There were no vitals filed for this visit.      Subjective Assessment - 07/05/17 1036    Subjective Pt stated he has Rt hip/side pain today, scale 4/10.     Patient Stated Goals not to fall    Currently in Pain? Yes   Pain Score 4    Pain Location Back   Pain Orientation Lower;Right   Pain Descriptors / Indicators Sharp   Pain Type Chronic pain   Pain Onset More than a month ago   Pain Frequency Intermittent   Aggravating Factors  activity   Pain Relieving Factors tylenol                         [facility] Adult PT Treatment/Exercise - 07/05/17 0001      Knee/Hip Exercises: Standing   Heel Raises 1 set;20 reps   Heel Raises Limitations  heel and toe    Forward Lunges 15 reps;Both   Forward Lunges Limitations 6in step 1 HHA   Forward Step Up Both;15 reps;Hand Hold: 1;Step Height: 6"   Functional Squat 10 reps             Balance Exercises - 07/05/17 1053      Balance Exercises: Standing   Tandem Stance Eyes open  tandem stance on static floor with head turns   SLS Eyes open;5 reps  Rt 7", Lt 11"   Balance Beam tandem 2RT   Retro Gait 2 reps   Sidestepping 2 reps;Theraband  RTB   Step Over Hurdles / Cones 6 inch x 2 RT    Heel Raises Limitations 15   Toe Raise Limitations 15   Sit to Stand Time 10             PT Short Term Goals - 06/19/17 1214      PT SHORT TERM GOAL #1   Title Pt to be able to single leg stance on both LE for at least 10 seconds to reduce risk of falling    Time 2  Period Weeks     PT SHORT TERM GOAL #2   Title Pt LE strength to improve by 1/2 grade to be able to walk up and down 8 steps in a reciprocal manner without difficulty    Time 3   Period Weeks   Status New     PT SHORT TERM GOAL #3   Title Pt to have had no falls in the past week    Time 3   Period Weeks   Status New           PT Long Term Goals - 06/19/17 1215      PT LONG TERM GOAL #1   Title Pt to be able to single leg balance on both legs for 15 seconds or greater to feel confident walking on uneven terrain.    Time 4   Period Weeks   Status New     PT LONG TERM GOAL #2   Title Pt strength of LE to be improved by 1 grade to allow pt to be able to get in and out of a low car without difficulty    Time 4   Period Weeks   Status New     PT LONG TERM GOAL #3   Title Pt to have no falls for the past 3 weeks    Time 4   Period Weeks   Status New     PT LONG TERM GOAL #4   Title Pt to be able to go up and down his steps with groceries in his hands in a reciprocal manner    Time 4   Period Weeks   Status New               Plan - 07/05/17 1137    Clinical Impression Statement Added  squats for gluteal strengthening.  Continued balance activities with min A for safety.  Continues to have difficulty with tandem stance and head turns, no foam this session.   Rehab Potential Good   PT Frequency 2x / week   PT Duration 4 weeks   PT Treatment/Interventions ADLs/Self Care Home Management;Neuromuscular re-education;Gait training;Stair training;Functional mobility training;Therapeutic activities;Therapeutic exercise;Balance training   PT Next Visit Plan Continue functional strengthening and balance training.  Next session complete lunges without height and stair training   PT Home Exercise Plan heel raises, bent knee raise, bridge, sidelying abduction, SLS       Patient will benefit from skilled therapeutic intervention in order to improve the following deficits and impairments:  Abnormal gait, Decreased activity tolerance, Decreased balance, Pain, Decreased strength  Visit Diagnosis: Repeated falls  Muscle weakness (generalized)  Unsteadiness on feet     Problem List Patient Active Problem List   Diagnosis Date Noted  . Essential hypertension 04/30/2017  . Hypothyroid 04/30/2017  . HLD (hyperlipidemia) 04/30/2017  . CAD in native artery 04/30/2017  . 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 Pedal edema 04/30/2017  . Glaucoma 04/30/2017  . Abdominal aortic atherosclerosis ([place]) 04/30/2017  . 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 Asymptomatic gallstones 04/30/2017  . Benign prostatic hyperplasia 04/30/2017  . Diverticulosis 04/30/2017  . Esophagitis 04/30/2017  . Angular cheilitis 04/30/2017  . Mild memory disturbance 04/30/2017  . A-fib ([place]) 04/11/2017  . Rheumatoid arthritis ([place]) 04/11/2017   Ihor Austin, [place]; [place]  Aldona Lento 07/05/2017, 11:45 AM  [place], [place], [address] Phone: [phone]   Fax:  [phone]  Name:  Yuuki Skeens MRN: [id number] Date of Birth: 08/01/1941

## 2017-07-09 ENCOUNTER — Ambulatory Visit (HOSPITAL_COMMUNITY): Payer: Medicare PPO

## 2017-07-09 DIAGNOSIS — R296 Repeated falls: Secondary | ICD-10-CM

## 2017-07-09 DIAGNOSIS — M6281 Muscle weakness (generalized): Secondary | ICD-10-CM

## 2017-07-09 DIAGNOSIS — R2681 Unsteadiness on feet: Secondary | ICD-10-CM

## 2017-07-09 NOTE — Therapy (Signed)
[place], [place], [address] Phone: [phone]   Fax:  [phone]  Physical Therapy Treatment  Patient Details  Name: [name] MRN: [id number] Date of Birth: 1941-02-18 Referring Provider: Blanchie Serve   Encounter Date: 07/09/2017      PT End of Session - 07/09/17 1030    Visit Number 7   Number of Visits 8   Date for PT Re-Evaluation 07/19/17   Authorization Type [employer] medicare   Authorization - Visit Number 7   Authorization - Number of Visits 8   PT Start Time 7564   PT Stop Time 1113   PT Time Calculation (min) 51 min   Equipment Utilized During Treatment Gait belt   Activity Tolerance Patient tolerated treatment well;No increased pain   Behavior During Therapy WFL for tasks assessed/performed      Past Medical History:  Diagnosis Date  . A-fib ([place])   . Arthritis    osteoarthritis  . Cataract   . Frequent falls   . Glaucoma   . High cholesterol   . Hypertension   . MI (myocardial infarction) ([place])   . Rheumatoid arthritis ([place])   . Thyroid disease     Past Surgical History:  Procedure Laterality Date  . CORONARY ANGIOPLASTY WITH STENT PLACEMENT    . JOINT REPLACEMENT     left knee  . REPLACEMENT TOTAL KNEE Left     There were no vitals filed for this visit.      Subjective Assessment - 07/09/17 1028    Subjective No reports of pain or recent changes.  Compliant with HEP   Pertinent History DJD lumbar, HTN, CAD, HTN; fx collar bone.    Currently in Pain? No/denies              [facility] Adult PT Treatment/Exercise - 07/09/17 0001      Knee/Hip Exercises: Standing   Heel Raises 1 set;20 reps   Heel Raises Limitations heel and toe    Forward Lunges 15 reps;Both   Forward Lunges Limitations on floor   Forward Step Up Both;15 reps;Hand Hold: 1;Step Height: 6"   Forward Step Up Limitations 6 inch box    Step Down Both;10 reps;Hand Hold: 1;Step Height: 4"   Functional Squat 10 reps;5 reps   Rocker Board 2 minutes   Rocker Board Limitations R/L             Balance Exercises - 07/09/17 1045      Balance Exercises: Standing   Tandem Stance Eyes open;Foam/compliant surface;3 reps;30 secs  tandem stance 10x head rotation, 10x UE flexion both sides   SLS Eyes open;5 reps   Balance Beam tandem 2RT   Retro Gait 2 reps   Sidestepping 2 reps;Theraband  RTB   Step Over Hurdles / Cones 6 inch x 2 RT forward and sidestep   Heel Raises Limitations 15   Toe Raise Limitations 15   Sit to Stand Time 10             PT Short Term Goals - 06/19/17 1214      PT SHORT TERM GOAL #1   Title Pt to be able to single leg stance on both LE for at least 10 seconds to reduce risk of falling    Time 2   Period Weeks     PT SHORT TERM GOAL #2   Title Pt LE strength to improve by 1/2 grade to be able to walk up and  down 8 steps in a reciprocal manner without difficulty    Time 3   Period Weeks   Status New     PT SHORT TERM GOAL #3   Title Pt to have had no falls in the past week    Time 3   Period Weeks   Status New           PT Long Term Goals - 06/19/17 1215      PT LONG TERM GOAL #1   Title Pt to be able to single leg balance on both legs for 15 seconds or greater to feel confident walking on uneven terrain.    Time 4   Period Weeks   Status New     PT LONG TERM GOAL #2   Title Pt strength of LE to be improved by 1 grade to allow pt to be able to get in and out of a low car without difficulty    Time 4   Period Weeks   Status New     PT LONG TERM GOAL #3   Title Pt to have no falls for the past 3 weeks    Time 4   Period Weeks   Status New     PT LONG TERM GOAL #4   Title Pt to be able to go up and down his steps with groceries in his hands in a reciprocal manner    Time 4   Period Weeks   Status New               Plan - 07/09/17 1226    Clinical Impression Statement Continued session focus with balance  training and functional strengthening.  Progressed lunges to floor for increased weight bearing/strengthening wtih cueing for form.  Added foam with tandem stance and head turns, min guard/A required.     Rehab Potential Good   PT Frequency 2x / week   PT Duration 4 weeks   PT Treatment/Interventions ADLs/Self Care Home Management;Neuromuscular re-education;Gait training;Stair training;Functional mobility training;Therapeutic activities;Therapeutic exercise;Balance training   PT Next Visit Plan Continue functional strengthening and balance training.  Add vector stance   PT Home Exercise Plan heel raises, bent knee raise, bridge, sidelying abduction, SLS       Patient will benefit from skilled therapeutic intervention in order to improve the following deficits and impairments:  Abnormal gait, Decreased activity tolerance, Decreased balance, Pain, Decreased strength  Visit Diagnosis: Repeated falls  Muscle weakness (generalized)  Unsteadiness on feet     Problem List Patient Active Problem List   Diagnosis Date Noted  . Essential hypertension 04/30/2017  . Hypothyroid 04/30/2017  . HLD (hyperlipidemia) 04/30/2017  . CAD in native artery 04/30/2017  . 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 Pedal edema 04/30/2017  . Glaucoma 04/30/2017  . Abdominal aortic atherosclerosis ([place]) 04/30/2017  . 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 Asymptomatic gallstones 04/30/2017  . Benign prostatic hyperplasia 04/30/2017  . Diverticulosis 04/30/2017  . Esophagitis 04/30/2017  . Angular cheilitis 04/30/2017  . Mild memory disturbance 04/30/2017  . A-fib ([place]) 04/11/2017  . Rheumatoid arthritis ([place]) 04/11/2017   Ihor Austin, [place]; [place] Aldona Lento 07/09/2017, 12:33 PM  [place] [address] [place], [place], [address] Phone: [phone]   Fax:  [phone]  Name: [name] MRN:  [id number] Date of Birth: 1941/11/12

## 2017-07-10 ENCOUNTER — Ambulatory Visit (INDEPENDENT_AMBULATORY_CARE_PROVIDER_SITE_OTHER): Payer: Medicare PPO | Admitting: Family Medicine

## 2017-07-10 ENCOUNTER — Encounter: Payer: Self-pay | Admitting: Family Medicine

## 2017-07-10 VITALS — BP 130/60 | HR 73 | Temp 97.1°F | Resp 18 | Ht 71.0 in | Wt 184.8 lb

## 2017-07-10 DIAGNOSIS — I2510 Atherosclerotic heart disease of native coronary artery without angina pectoris: Secondary | ICD-10-CM | POA: Diagnosis not present

## 2017-07-10 DIAGNOSIS — L819 Disorder of pigmentation, unspecified: Secondary | ICD-10-CM | POA: Diagnosis not present

## 2017-07-10 DIAGNOSIS — R2681 Unsteadiness on feet: Secondary | ICD-10-CM | POA: Diagnosis not present

## 2017-07-10 DIAGNOSIS — I10 Essential (primary) hypertension: Secondary | ICD-10-CM

## 2017-07-10 DIAGNOSIS — R413 Other amnesia: Secondary | ICD-10-CM | POA: Diagnosis not present

## 2017-07-10 NOTE — Patient Instructions (Signed)
I have placed a referral to dermatology  Continue current medicines  Walk and exercise every day  See me in 3 months  Call sooner for problems

## 2017-07-10 NOTE — Progress Notes (Signed)
Chief Complaint  Patient presents with  . Follow-up   Routine follow up Drove himself here Attends PT No more falls Feels stronger No acute complaint I observe lesion on R cheek is larger/inflammed.  He agrees to derm referral.  Patient Active Problem List   Diagnosis Date Noted  . Essential hypertension 04/30/2017  . Hypothyroid 04/30/2017  . HLD (hyperlipidemia) 04/30/2017  . CAD in native artery 04/30/2017  . 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 Pedal edema 04/30/2017  . Glaucoma 04/30/2017  . Abdominal aortic atherosclerosis ([place]) 04/30/2017  . 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 Asymptomatic gallstones 04/30/2017  . Benign prostatic hyperplasia 04/30/2017  . Diverticulosis 04/30/2017  . Esophagitis 04/30/2017  . Angular cheilitis 04/30/2017  . Mild memory disturbance 04/30/2017  . A-fib ([place]) 04/11/2017  . Rheumatoid arthritis ([place]) 04/11/2017    Outpatient Encounter Prescriptions as of 07/10/2017  Medication Sig  . acetaminophen (TYLENOL) 650 MG CR tablet Take 650 mg by mouth every 8 (eight) hours as needed for pain.  . folic acid (FOLVITE) 1 MG tablet Take 1 tablet (1 mg total) by mouth daily.  . furosemide (LASIX) 40 MG tablet Take 40 mg daily for 5 days and then As NEEDED for leg swelling (Patient taking differently: Take 40 mg by mouth daily as needed for fluid or edema. As NEEDED for leg swelling)  . inFLIXimab (REMICADE) 100 MG injection Inject into the vein every 8 (eight) weeks.   Marland Kitchen levothyroxine (SYNTHROID, LEVOTHROID) 75 MCG tablet Take 1 tablet (75 mcg total) by mouth daily before breakfast.  . lisinopril (PRINIVIL,ZESTRIL) 5 MG tablet TAKE ONE TABLET BY MOUTH ONCE DAILY  . methotrexate 2.5 MG tablet Take 15 mg by mouth every Sunday. 6 tablets on Sunday  . metoprolol succinate (TOPROL-XL) 25 MG 24 hr tablet Take 1 tablet (25 mg total) by mouth daily.  . polycarbophil (FIBERCON) 625 MG tablet Take 625 mg by mouth at  bedtime.  . potassium chloride SA (K-DUR,KLOR-CON) 20 MEQ tablet Tale Potassium 20 meq daily for 5 days and then daily as NEEDED for leg swelling (Patient taking differently: Take 20 mEq by mouth daily as needed. as NEEDED for leg swelling)  . rivaroxaban (XARELTO) 20 MG TABS tablet Take 1 tablet (20 mg total) by mouth daily with supper. (Patient taking differently: Take 20 mg by mouth every morning. )  . simvastatin (ZOCOR) 40 MG tablet Take 1 tablet (40 mg total) by mouth daily.  . timolol (TIMOPTIC) 0.5 % ophthalmic solution INSTILL ONE DROP INTO EACH EYE TWICE DAILY   No facility-administered encounter medications on file as of 07/10/2017.     No Known Allergies  Review of Systems  Constitutional: Negative for appetite change, fatigue and unexpected weight change.  HENT: Negative for congestion and dental problem.   Eyes: Negative for photophobia and visual disturbance.  Respiratory: Negative for cough and shortness of breath.   Cardiovascular: Negative for chest pain, palpitations and leg swelling.  Gastrointestinal: Negative for constipation and diarrhea.  Genitourinary: Negative for difficulty urinating and frequency.  Musculoskeletal: Positive for back pain and gait problem.       Low back pain since fall, right SI region occas  Skin:       face  Neurological: Positive for weakness. Negative for dizziness and light-headedness.  Psychiatric/Behavioral: Negative for behavioral problems and dysphoric mood. The patient is not nervous/anxious.     BP 130/60 (BP Location: Left Arm, Patient Position: Sitting, Cuff Size: Normal)  Pulse 73   Temp (!) 97.1 F (36.2 C) (Other (Comment))   Resp 18   Ht 5\' 11"  (1.803 m)   Wt 184 lb 12 oz (83.8 kg)   SpO2 98%   BMI 25.77 kg/m   Physical Exam  Constitutional: He appears well-developed and well-nourished.  Uses cane, unsteady - but Improved  HENT:  Head: Normocephalic and atraumatic.    Mouth/Throat: Oropharynx is clear and  moist.  Verrucous lesion right cheek with inflammation.  Left occiput has cystic mass, superficial  Eyes: Pupils are equal, round, and reactive to light. Conjunctivae are normal.  Neck: Normal range of motion. No thyromegaly present.  Cardiovascular: Normal rate and regular rhythm.   Pulmonary/Chest: Effort normal and breath sounds normal. He has no wheezes.  Musculoskeletal: Normal range of motion.  Lymphadenopathy:    He has no cervical adenopathy.  Neurological: He is alert.  Psychiatric: He has a normal mood and affect.  Slightly hesitant.  Memory impairned    ASSESSMENT/PLAN:  1. Mild memory disturbance  2. Gait instability improved  3. Change in pigmented skin lesion of face - Ambulatory referral to Dermatology  4. CAD in native artery asymptomatic  5. Essential hypertension controlled   Patient Instructions  I have placed a referral to dermatology  Continue current medicines  Walk and exercise every day  See me in 3 months  Call sooner for problems     Raylene Everts, MD

## 2017-07-12 ENCOUNTER — Ambulatory Visit (HOSPITAL_COMMUNITY): Payer: Medicare PPO | Admitting: Physical Therapy

## 2017-07-12 ENCOUNTER — Telehealth: Payer: Self-pay | Admitting: *Deleted

## 2017-07-12 NOTE — Telephone Encounter (Signed)
LVM for pt to call office in reference to his referral to Dermatology. Thank you

## 2017-07-13 ENCOUNTER — Ambulatory Visit (HOSPITAL_COMMUNITY): Payer: Medicare PPO

## 2017-07-13 DIAGNOSIS — M6281 Muscle weakness (generalized): Secondary | ICD-10-CM

## 2017-07-13 DIAGNOSIS — R296 Repeated falls: Secondary | ICD-10-CM | POA: Diagnosis not present

## 2017-07-13 DIAGNOSIS — R2681 Unsteadiness on feet: Secondary | ICD-10-CM

## 2017-07-13 NOTE — Therapy (Signed)
[facility] [place], [place], [address] Phone: [phone]   Fax:  [phone]  Physical Therapy Treatment (Re-Assessment)  Patient Details  Name: [name] MRN: [id number] Date of Birth: 1941-06-29 Referring Provider: Blanchie Serve   Encounter Date: 07/13/2017      PT End of Session - 07/13/17 1204    Visit Number 8   Number of Visits 12   Date for PT Re-Evaluation 07/27/17   Authorization Type [employer] medicare   Authorization - Visit Number 8   Authorization - Number of Visits 18   PT Start Time 4431  late, had to figure out switch due to patient originally being with PTA for last session re-assess    PT Stop Time 1155   PT Time Calculation (min) 30 min   Equipment Utilized During Treatment Gait belt   Activity Tolerance Patient tolerated treatment well   Behavior During Therapy [facility] for tasks assessed/performed      Past Medical History:  Diagnosis Date  . A-fib ([place])   . Arthritis    osteoarthritis  . Cataract   . Frequent falls   . Glaucoma   . High cholesterol   . Hypertension   . MI (myocardial infarction) ([place])   . Rheumatoid arthritis ([place])   . Thyroid disease     Past Surgical History:  Procedure Laterality Date  . CORONARY ANGIOPLASTY WITH STENT PLACEMENT    . JOINT REPLACEMENT     left knee  . REPLACEMENT TOTAL KNEE Left     There were no vitals filed for this visit.      Subjective Assessment - 07/13/17 1128    Subjective Patient reports he is feeling steadier, he feels like most things are easier for him to do. He has learned to pay closer attention to what he is doing. He did fall going into [facility], they recently resurfaced the road there and he tripped on the lip of the road and fell.    Patient Stated Goals not to fall    Currently in Pain? No/denies            [facility] PT Assessment - 07/13/17 0001      Assessment   Medical Diagnosis unsteadiness on feet; falls    Referring Provider Blanchie Serve    Onset Date/Surgical Date 03/25/17   Next MD Visit Dr. Meda Coffee in 3-4 weeks    Prior Therapy none      Precautions   Precautions Fall     Restrictions   Weight Bearing Restrictions No     Balance Screen   Has the patient fallen in the past 6 months Yes   How many times? 3   Has the patient had a decrease in activity level because of a fear of falling?  No   Is the patient reluctant to leave their home because of a fear of falling?  No     Home Ecologist residence     Prior Function   Level of Independence Independent with community mobility with device   Leisure grandchildren sports events      Sit to Stand   Comments 5x12.8     Strength   Right Hip Flexion 3/5   Right Hip Extension 3-/5   Right Hip ABduction 3/5   Left Hip Flexion 3/5   Left Hip Extension 3-/5   Left Hip ABduction 3/5   Right Knee Flexion 4+/5   Right Knee Extension 5/5  Left Knee Flexion 4/5   Left Knee Extension 5/5   Right Ankle Dorsiflexion 5/5   Left Ankle Dorsiflexion 5/5     High Level Balance   High Level Balance Comments SLS 5-6 seconds B                           Balance Exercises - 07/13/17 1203      Balance Exercises: Standing   Step Over Hurdles / Cones 4 inch x4 round trips forward; 2 round trips lateral            PT Education - 07/13/17 1204    Education provided Yes   Education Details progress iwth skilled PT services, review of progress towards goals; PT recommendations and POC moving forward    Person(s) Educated Patient   Methods Explanation   Comprehension Verbalized understanding          PT Short Term Goals - 07/13/17 1141      PT SHORT TERM GOAL #1   Title Pt to be able to single leg stance on both LE for at least 10 seconds to reduce risk of falling    Baseline 7/20- 6 seconds best    Time 2   Period Weeks   Status On-going     PT SHORT TERM GOAL #2   Title Pt LE  strength to improve by 1/2 grade to be able to walk up and down 8 steps in a reciprocal manner without difficulty    Baseline 7/20- global improvement    Time 3   Period Weeks   Status Achieved     PT SHORT TERM GOAL #3   Title Pt to have had no falls in the past week    Baseline 7/20- fall on sunday    Time 3   Period Weeks   Status On-going           PT Long Term Goals - 07/13/17 1142      PT LONG TERM GOAL #1   Title Pt to be able to single leg balance on both legs for 15 seconds or greater to feel confident walking on uneven terrain.    Baseline 7/20- 6 second best    Time 4   Period Weeks   Status On-going     PT LONG TERM GOAL #2   Title Pt strength of LE to be improved by 1 grade to allow pt to be able to get in and out of a low car without difficulty    Baseline 7/20- improving but has not met goal    Time 4   Period Weeks   Status On-going     PT LONG TERM GOAL #3   Title Pt to have no falls for the past 3 weeks    Baseline 7/20- fall on sunday    Time 4   Period Weeks   Status On-going     PT LONG TERM GOAL #4   Title Pt to be able to go up and down his steps with groceries in his hands in a reciprocal manner    Baseline 7/20- able but very unsteady    Time 4   Period Weeks   Status On-going               Plan - 07/13/17 1205    Clinical Impression Statement Session started late due to needing to figure out switch from PTA to DPT for last scheduled session re-assessment.  Patient appears to have made some mild progress with skilled PT services however he does continue to demonstrate considerable unsteadiness and functional weakness, he also had a fall this past weekend tripping on some concrete in a parking lot. Recommend continuation of skilled PT services to address these factors however patient only agreeable to 2 more weeks at this time.    Rehab Potential Good   PT Frequency 2x / week   PT Duration 2 weeks   PT Treatment/Interventions  ADLs/Self Care Home Management;Neuromuscular re-education;Gait training;Stair training;Functional mobility training;Therapeutic activities;Therapeutic exercise;Balance training   PT Next Visit Plan focus on strength and balance; mini-reassess on visit 12    PT Home Exercise Plan heel raises, bent knee raise, bridge, sidelying abduction, SLS    Consulted and Agree with Plan of Care Patient      Patient will benefit from skilled therapeutic intervention in order to improve the following deficits and impairments:  Abnormal gait, Decreased activity tolerance, Decreased balance, Pain, Decreased strength  Visit Diagnosis: Repeated falls - Plan: PT plan of care cert/re-cert  Muscle weakness (generalized) - Plan: PT plan of care cert/re-cert  Unsteadiness on feet - Plan: PT plan of care cert/re-cert       G-Codes - 07/24/2017 1210    Functional Assessment Tool Used (Outpatient Only) foto, SLS, hx of falls    Functional Limitation Changing and maintaining body position   Changing and Maintaining Body Position Current Status (B9390) At least 20 percent but less than 40 percent impaired, limited or restricted   Changing and Maintaining Body Position Goal Status (Z0092) At least 1 percent but less than 20 percent impaired, limited or restricted      Problem List Patient Active Problem List   Diagnosis Date Noted  . Essential hypertension 04/30/2017  . Hypothyroid 04/30/2017  . HLD (hyperlipidemia) 04/30/2017  . CAD in native artery 04/30/2017  . 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 Pedal edema 04/30/2017  . Glaucoma 04/30/2017  . Abdominal aortic atherosclerosis ([place]) 04/30/2017  . 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 Asymptomatic gallstones 04/30/2017  . Benign prostatic hyperplasia 04/30/2017  . Diverticulosis 04/30/2017  . Esophagitis 04/30/2017  . Angular cheilitis 04/30/2017  . Mild memory disturbance 04/30/2017  . A-fib ([place]) 04/11/2017  .  Rheumatoid arthritis ([place]) 04/11/2017    Deniece Ree PT, DPT [phone]  [facility] [address] [place], [place], [address] Phone: [phone]   Fax:  [phone]  Name: [name] MRN: [id number] Date of Birth: [birth year]

## 2017-07-16 ENCOUNTER — Ambulatory Visit (HOSPITAL_COMMUNITY): Payer: Medicare PPO | Admitting: Physical Therapy

## 2017-07-16 DIAGNOSIS — R296 Repeated falls: Secondary | ICD-10-CM

## 2017-07-16 DIAGNOSIS — M6281 Muscle weakness (generalized): Secondary | ICD-10-CM

## 2017-07-16 DIAGNOSIS — R2681 Unsteadiness on feet: Secondary | ICD-10-CM

## 2017-07-16 NOTE — Therapy (Signed)
[place], [place], [address] Phone: [phone]   Fax:  [phone]  Physical Therapy Treatment  Patient Details  Name: [name] MRN: [id number] Date of Birth: [birth year] Referring Provider: Blanchie Serve   Encounter Date: 07/16/2017      PT End of Session - 07/16/17 1219    Visit Number 9   Number of Visits 12   Date for PT Re-Evaluation 07/27/17   Authorization Type [employer] medicare   Authorization - Visit Number 9   Authorization - Number of Visits 18   PT Start Time 4827  Pt using restroom prior to start of session    PT Stop Time 1115   PT Time Calculation (min) 34 min   Equipment Utilized During Treatment Gait belt   Activity Tolerance Patient tolerated treatment well;No increased pain   Behavior During Therapy WFL for tasks assessed/performed      Past Medical History:  Diagnosis Date  . A-fib ([place])   . Arthritis    osteoarthritis  . Cataract   . Frequent falls   . Glaucoma   . High cholesterol   . Hypertension   . MI (myocardial infarction) ([place])   . Rheumatoid arthritis ([place])   . Thyroid disease     Past Surgical History:  Procedure Laterality Date  . CORONARY ANGIOPLASTY WITH STENT PLACEMENT    . JOINT REPLACEMENT     left knee  . REPLACEMENT TOTAL KNEE Left     There were no vitals filed for this visit.      Subjective Assessment - 07/16/17 1217    Subjective Pt reports things are going ok. He has nothing to report from his last session.    Patient Stated Goals not to fall    Currently in Pain? No/denies                         [facility] Adult PT Treatment/Exercise - 07/16/17 0001      Knee/Hip Exercises: Standing   Heel Raises Both;1 set;20 reps;Other (comment)  on incline    Heel Raises Limitations toe raises on incline x20 reps    Other Standing Knee Exercises side stepping over 12" hurdle x10 reps Lt and Rt    Other Standing Knee Exercises hip  hike/lower on 6" box x15 reps each      Knee/Hip Exercises: Seated   Sit to Sand 10 reps  green TB              Balance Exercises - 07/16/17 1052      Balance Exercises: Standing   Tandem Stance Eyes open;Eyes closed;4 reps;Other (comment);Foam/compliant surface  x2 each   SLS Eyes open;Foam/compliant surface  single leg marching x10 reps each    Balance Beam foam x3 RT, CGA    Tandem Gait Forward;2 reps;Other (comment)  firm surface           PT Education - 07/16/17 1219    Education provided Yes   Education Details technique with therex    Person(s) Educated Patient   Methods Explanation;Tactile cues;Verbal cues   Comprehension Verbalized understanding;Need further instruction          PT Short Term Goals - 07/13/17 1141      PT SHORT TERM GOAL #1   Title Pt to be able to single leg stance on both LE for at least 10 seconds to reduce risk of falling    Baseline 7/20- 6 seconds  best    Time 2   Period Weeks   Status On-going     PT SHORT TERM GOAL #2   Title Pt LE strength to improve by 1/2 grade to be able to walk up and down 8 steps in a reciprocal manner without difficulty    Baseline 7/20- global improvement    Time 3   Period Weeks   Status Achieved     PT SHORT TERM GOAL #3   Title Pt to have had no falls in the past week    Baseline 7/20- fall on sunday    Time 3   Period Weeks   Status On-going           PT Long Term Goals - 07/13/17 1142      PT LONG TERM GOAL #1   Title Pt to be able to single leg balance on both legs for 15 seconds or greater to feel confident walking on uneven terrain.    Baseline 7/20- 6 second best    Time 4   Period Weeks   Status On-going     PT LONG TERM GOAL #2   Title Pt strength of LE to be improved by 1 grade to allow pt to be able to get in and out of a low car without difficulty    Baseline 7/20- improving but has not met goal    Time 4   Period Weeks   Status On-going     PT LONG TERM GOAL #3    Title Pt to have no falls for the past 3 weeks    Baseline 7/20- fall on sunday    Time 4   Period Weeks   Status On-going     PT LONG TERM GOAL #4   Title Pt to be able to go up and down his steps with groceries in his hands in a reciprocal manner    Baseline 7/20- able but very unsteady    Time 4   Period Weeks   Status On-going               Plan - 07/16/17 1220    Clinical Impression Statement Continued with therex to address LE strength and progress both static and dynamic balance. Pt was able to complete all exercises without difficulty, needing heavy cuing for more complex activities. Noted poor foot clearance and occasional shuffling gait throughout the session, and pt could benefit from more focus on ankle strength in future sessions. Otherwise, will continue with current POC.   Rehab Potential Good   PT Frequency 2x / week   PT Duration 2 weeks   PT Treatment/Interventions ADLs/Self Care Home Management;Neuromuscular re-education;Gait training;Stair training;Functional mobility training;Therapeutic activities;Therapeutic exercise;Balance training   PT Next Visit Plan target ankle strength; functional hip strengthening; progress balance activity    PT Home Exercise Plan heel raises, bent knee raise, bridge, sidelying abduction, SLS    Consulted and Agree with Plan of Care Patient      Patient will benefit from skilled therapeutic intervention in order to improve the following deficits and impairments:  Abnormal gait, Decreased activity tolerance, Decreased balance, Pain, Decreased strength  Visit Diagnosis: Repeated falls  Muscle weakness (generalized)  Unsteadiness on feet     Problem List Patient Active Problem List   Diagnosis Date Noted  . Essential hypertension 04/30/2017  . Hypothyroid 04/30/2017  . HLD (hyperlipidemia) 04/30/2017  . CAD in native artery 04/30/2017  . 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 Pedal edema  04/30/2017  .  Glaucoma 04/30/2017  . Abdominal aortic atherosclerosis ([place]) 04/30/2017  . 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 Asymptomatic gallstones 04/30/2017  . Benign prostatic hyperplasia 04/30/2017  . Diverticulosis 04/30/2017  . Esophagitis 04/30/2017  . Angular cheilitis 04/30/2017  . Mild memory disturbance 04/30/2017  . A-fib ([place]) 04/11/2017  . Rheumatoid arthritis ([place]) 04/11/2017    12:26 PM,07/16/17 Elly Modena PT, DPT [facility] [address] [place], [place], [address] Phone: [phone]   Fax:  [phone]  Name: [name] MRN: [id number] Date of Birth: 29-Nov-1941

## 2017-07-18 ENCOUNTER — Ambulatory Visit (HOSPITAL_COMMUNITY): Payer: Medicare PPO | Admitting: Physical Therapy

## 2017-07-18 DIAGNOSIS — R2681 Unsteadiness on feet: Secondary | ICD-10-CM

## 2017-07-18 DIAGNOSIS — R296 Repeated falls: Secondary | ICD-10-CM | POA: Diagnosis not present

## 2017-07-18 DIAGNOSIS — M6281 Muscle weakness (generalized): Secondary | ICD-10-CM

## 2017-07-18 NOTE — Therapy (Signed)
[place], [place], [address] Phone: [phone]   Fax:  [phone]  Physical Therapy Treatment  Patient Details  Name: [name] MRN: [id number] Date of Birth: 1941/04/16 Referring Provider: Blanchie Serve   Encounter Date: 07/18/2017      PT End of Session - 07/18/17 1412    Visit Number 10   Number of Visits 18   Date for PT Re-Evaluation 07/27/17   Authorization Type [employer] medicare   Authorization - Visit Number 10 Gcode done on visit 8    Authorization - Number of Visits 18   PT Start Time 1345   PT Stop Time 1425   PT Time Calculation (min) 40 min   Equipment Utilized During Treatment Gait belt   Activity Tolerance Patient tolerated treatment well;No increased pain   Behavior During Therapy WFL for tasks assessed/performed      Past Medical History:  Diagnosis Date  . A-fib ([place])   . Arthritis    osteoarthritis  . Cataract   . Frequent falls   . Glaucoma   . High cholesterol   . Hypertension   . MI (myocardial infarction) ([place])   . Rheumatoid arthritis ([place])   . Thyroid disease     Past Surgical History:  Procedure Laterality Date  . CORONARY ANGIOPLASTY WITH STENT PLACEMENT    . JOINT REPLACEMENT     left knee  . REPLACEMENT TOTAL KNEE Left     There were no vitals filed for this visit.          [facility] PT Assessment - 07/18/17 0001      Assessment   Medical Diagnosis unsteadiness on feet; falls    Onset Date/Surgical Date 03/25/17   Next MD Visit Dr. Meda Coffee in 3-4 weeks    Prior Therapy none      Precautions   Precautions Fall     Restrictions   Weight Bearing Restrictions No     Balance Screen   Has the patient fallen in the past 6 months Yes   How many times? 3   Has the patient had a decrease in activity level because of a fear of falling?  No   Is the patient reluctant to leave their home because of a fear of falling?  No     Home Manufacturing systems engineer residence     Prior Function   Level of Independence Independent with community mobility with device   Leisure grandchildren sports events      Sit to Stand   Comments 5x12.8     Strength   Right Hip Flexion 3/5  was 3/5    Right Hip Extension 3-/5  was 2/5    Right Hip ABduction 3/5  was 2+   Left Hip Flexion 3/5  was 3-/5   Left Hip Extension 3-/5  was 2/5    Left Hip ABduction 3/5  was2+   Right Knee Flexion 4+/5   Right Knee Extension 5/5   Left Knee Flexion 4/5   Left Knee Extension 5/5   Right Ankle Dorsiflexion 5/5   Left Ankle Dorsiflexion 5/5     High Level Balance   High Level Balance Comments SLS 5-6 seconds B                      [facility] Adult PT Treatment/Exercise - 07/18/17 0001      Knee/Hip Exercises: Standing   Heel Raises Both;10 reps  Functional Squat 10 reps   SLS x5   Other Standing Knee Exercises marching in place x  10     Knee/Hip Exercises: Seated   Sit to Sand 10 reps     Knee/Hip Exercises: Supine   Bridges 10 reps   Straight Leg Raises 10 reps     Knee/Hip Exercises: Sidelying   Hip ABduction 10 reps             Balance Exercises - 07/18/17 1423      Balance Exercises: Standing   Tandem Stance Eyes open;Eyes closed;4 reps;Other (comment);Foam/compliant surface  x2 each   Sidestepping 2 reps;Theraband  RTB             PT Short Term Goals - 07/18/17 1413      PT SHORT TERM GOAL #1   Title Pt to be able to single leg stance on both LE for at least 10 seconds to reduce risk of falling    Baseline 7/20- 6 seconds best    Time 2   Period Weeks   Status Achieved     PT SHORT TERM GOAL #2   Title Pt LE strength to improve by 1/2 grade to be able to walk up and down 8 steps in a reciprocal manner without difficulty    Baseline 7/20- global improvement    Time 3   Period Weeks   Status Achieved     PT SHORT TERM GOAL #3   Title Pt to have had no falls in the past week     Baseline 7/20- fall on sunday    Time 3   Period Weeks   Status On-going           PT Long Term Goals - 07/18/17 1422      PT LONG TERM GOAL #1   Title Pt to be able to single leg balance on both legs for 15 seconds or greater to feel confident walking on uneven terrain.    Baseline 7/20- 6 second best ; 7/25 Rt 11 seconds LT 20    Time 4   Period Weeks   Status Partially Met     PT LONG TERM GOAL #2   Title Pt strength of LE to be improved by 1 grade to allow pt to be able to get in and out of a low car without difficulty    Baseline 7/20- improving but has not met goal    Time 4   Period Weeks   Status On-going     PT LONG TERM GOAL #3   Title Pt to have no falls for the past 3 weeks    Baseline 7/20- fall on sunday    Time 4   Period Weeks   Status On-going     PT LONG TERM GOAL #4   Title Pt to be able to go up and down his steps with groceries in his hands in a reciprocal manner    Baseline 7/20- able but very unsteady    Time 4   Period Weeks   Status On-going               Plan - 07/18/17 1438    Clinical Impression Statement Pt continues to improve with strength and balance.  Pt balance and strength was at a low level upon inital evaluation that even with the gains the patient continues to be at a risk for falls.  Therapist and pt spoke about this and pt is agreeable to six  more sessions for a total of 18 visit    Rehab Potential Good   PT Frequency 2x / week   PT Duration Other (comment)  9   PT Treatment/Interventions ADLs/Self Care Home Management;Neuromuscular re-education;Gait training;Stair training;Functional mobility training;Therapeutic activities;Therapeutic exercise;Balance training   PT Next Visit Plan target ankle strength; functional hip strengthening; progress balance activity    PT Home Exercise Plan heel raises, bent knee raise, bridge, sidelying abduction, SLS    Consulted and Agree with Plan of Care Patient      Patient will  benefit from skilled therapeutic intervention in order to improve the following deficits and impairments:  Abnormal gait, Decreased activity tolerance, Decreased balance, Pain, Decreased strength  Visit Diagnosis: Repeated falls  Muscle weakness (generalized)  Unsteadiness on feet     Problem List Patient Active Problem List   Diagnosis Date Noted  . Essential hypertension 04/30/2017  . Hypothyroid 04/30/2017  . HLD (hyperlipidemia) 04/30/2017  . CAD in native artery 04/30/2017  . 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 Pedal edema 04/30/2017  . Glaucoma 04/30/2017  . Abdominal aortic atherosclerosis ([place]) 04/30/2017  . 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 Asymptomatic gallstones 04/30/2017  . Benign prostatic hyperplasia 04/30/2017  . Diverticulosis 04/30/2017  . Esophagitis 04/30/2017  . Angular cheilitis 04/30/2017  . Mild memory disturbance 04/30/2017  . A-fib ([place]) 04/11/2017  . Rheumatoid arthritis [facility]) 04/11/2017    [name], PT CLT [phone] 07/18/2017, 2:43 PM  [facility] [address] [place], [place], [address] Phone: [phone]   Fax:  [phone]  Name: [name] MRN: [id number] Date of Birth: Dec 19, 1941

## 2017-07-19 ENCOUNTER — Ambulatory Visit (HOSPITAL_COMMUNITY): Payer: Medicare PPO

## 2017-07-23 ENCOUNTER — Ambulatory Visit (HOSPITAL_COMMUNITY): Payer: Medicare PPO

## 2017-07-23 DIAGNOSIS — R296 Repeated falls: Secondary | ICD-10-CM

## 2017-07-23 DIAGNOSIS — R2681 Unsteadiness on feet: Secondary | ICD-10-CM

## 2017-07-23 DIAGNOSIS — M6281 Muscle weakness (generalized): Secondary | ICD-10-CM

## 2017-07-23 NOTE — Therapy (Signed)
[place], [place], [address] Phone: [phone]   Fax:  [phone]  Physical Therapy Treatment  Patient Details  Name: [name] MRN: [id number] Date of Birth: April 23, 1941 Referring Provider: Blanchie Serve   Encounter Date: 07/23/2017      PT End of Session - 07/23/17 1355    Visit Number 11   Number of Visits 18   Date for PT Re-Evaluation 07/27/17   Authorization Type [employer] medicare   Authorization - Visit Number 11   Authorization - Number of Visits 18   PT Start Time 1350   PT Stop Time 1430   PT Time Calculation (min) 40 min   Equipment Utilized During Treatment Gait belt   Activity Tolerance Patient tolerated treatment well;No increased pain   Behavior During Therapy WFL for tasks assessed/performed      Past Medical History:  Diagnosis Date  . A-fib ([place])   . Arthritis    osteoarthritis  . Cataract   . Frequent falls   . Glaucoma   . High cholesterol   . Hypertension   . MI (myocardial infarction) ([place])   . Rheumatoid arthritis ([place])   . Thyroid disease     Past Surgical History:  Procedure Laterality Date  . CORONARY ANGIOPLASTY WITH STENT PLACEMENT    . JOINT REPLACEMENT     left knee  . REPLACEMENT TOTAL KNEE Left     There were no vitals filed for this visit.      Subjective Assessment - 07/23/17 1353    Subjective Pt stated he continues to have some pain in lower back with certain positions.     Patient Stated Goals not to fall    Currently in Pain? Yes   Pain Score 5    Pain Location Back   Pain Orientation Lower;Right   Pain Descriptors / Indicators Sharp   Pain Type Chronic pain   Pain Onset More than a month ago   Pain Frequency Intermittent   Aggravating Factors  activity   Pain Relieving Factors tylenol                         [facility] Adult PT Treatment/Exercise - 07/23/17 0001      Knee/Hip Exercises: Standing   Heel Raises Both;20  reps   Heel Raises Limitations toe raises on incline x20 reps    Functional Squat 10 reps   SLS x5   Other Standing Knee Exercises marching in place on foam x  10 with 1 finger aide     Knee/Hip Exercises: Seated   Sit to Sand without UE support;10 reps  cueing for eccentric control             Balance Exercises - 07/23/17 1432      Balance Exercises: Standing   Tandem Stance Eyes open;Foam/compliant surface;3 reps;30 secs   SLS Eyes open;3 reps   Balance Beam foam x3 RT, CGA    Sidestepping 2 reps;Theraband   Step Over Hurdles / Cones 6 and 12in alternationg forward and sideways   Heel Raises Limitations 20   Toe Raise Limitations 20             PT Short Term Goals - 07/18/17 1413      PT SHORT TERM GOAL #1   Title Pt to be able to single leg stance on both LE for at least 10 seconds to reduce risk of falling    Baseline  7/20- 6 seconds best    Time 2   Period Weeks   Status Achieved     PT SHORT TERM GOAL #2   Title Pt LE strength to improve by 1/2 grade to be able to walk up and down 8 steps in a reciprocal manner without difficulty    Baseline 7/20- global improvement    Time 3   Period Weeks   Status Achieved     PT SHORT TERM GOAL #3   Title Pt to have had no falls in the past week    Baseline 7/20- fall on sunday    Time 3   Period Weeks   Status On-going           PT Long Term Goals - 07/18/17 1422      PT LONG TERM GOAL #1   Title Pt to be able to single leg balance on both legs for 15 seconds or greater to feel confident walking on uneven terrain.    Baseline 7/20- 6 second best ; 7/25 Rt 11 seconds LT 20    Time 4   Period Weeks   Status Partially Met     PT LONG TERM GOAL #2   Title Pt strength of LE to be improved by 1 grade to allow pt to be able to get in and out of a low car without difficulty    Baseline 7/20- improving but has not met goal    Time 4   Period Weeks   Status On-going     PT LONG TERM GOAL #3   Title Pt to  have no falls for the past 3 weeks    Baseline 7/20- fall on sunday    Time 4   Period Weeks   Status On-going     PT LONG TERM GOAL #4   Title Pt to be able to go up and down his steps with groceries in his hands in a reciprocal manner    Baseline 7/20- able but very unsteady    Time 4   Period Weeks   Status On-going               Plan - 07/23/17 1456    Clinical Impression Statement Continued session focus on balance training and functional strengthening.  Pt continues to require min A wiht balance activities for safety.  Cueing to improve awareness of posture and finding COG to assist with task.  No reports of pain, was limited by fatigue at EOS.     Rehab Potential Good   PT Frequency 2x / week   PT Duration Other (comment)  (9)   PT Treatment/Interventions ADLs/Self Care Home Management;Neuromuscular re-education;Gait training;Stair training;Functional mobility training;Therapeutic activities;Therapeutic exercise;Balance training   PT Next Visit Plan Continue functional hip strengthening and progress balance activity   PT Home Exercise Plan heel raises, bent knee raise, bridge, sidelying abduction, SLS       Patient will benefit from skilled therapeutic intervention in order to improve the following deficits and impairments:  Abnormal gait, Decreased activity tolerance, Decreased balance, Pain, Decreased strength  Visit Diagnosis: Repeated falls  Muscle weakness (generalized)  Unsteadiness on feet     Problem List Patient Active Problem List   Diagnosis Date Noted  . Essential hypertension 04/30/2017  . Hypothyroid 04/30/2017  . HLD (hyperlipidemia) 04/30/2017  . CAD in native artery 04/30/2017  . 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 Pedal edema 04/30/2017  . Glaucoma 04/30/2017  . Abdominal aortic atherosclerosis ([place])  04/30/2017  . 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 Asymptomatic gallstones 04/30/2017  . Benign  prostatic hyperplasia 04/30/2017  . Diverticulosis 04/30/2017  . Esophagitis 04/30/2017  . Angular cheilitis 04/30/2017  . Mild memory disturbance 04/30/2017  . A-fib ([place]) 04/11/2017  . Rheumatoid arthritis ([place]) 04/11/2017   Ihor [place], [place]; [place]  Aldona Lento 07/23/2017, 3:02 PM  [facility] [address] [place], [place], [address] Phone: [phone]   Fax:  [phone]  Name: [name] MRN: [id number] Date of Birth: April 21, 1941

## 2017-07-24 ENCOUNTER — Ambulatory Visit (HOSPITAL_COMMUNITY): Payer: Medicare PPO

## 2017-07-25 ENCOUNTER — Ambulatory Visit: Payer: Medicare PPO | Admitting: Cardiovascular Disease

## 2017-07-25 ENCOUNTER — Encounter: Payer: Self-pay | Admitting: Cardiovascular Disease

## 2017-07-26 ENCOUNTER — Ambulatory Visit (HOSPITAL_COMMUNITY): Payer: Medicare PPO

## 2017-07-27 ENCOUNTER — Ambulatory Visit (HOSPITAL_COMMUNITY): Payer: Medicare PPO | Attending: Family Medicine

## 2017-07-27 DIAGNOSIS — R2681 Unsteadiness on feet: Secondary | ICD-10-CM | POA: Insufficient documentation

## 2017-07-27 DIAGNOSIS — R296 Repeated falls: Secondary | ICD-10-CM | POA: Insufficient documentation

## 2017-07-27 DIAGNOSIS — M6281 Muscle weakness (generalized): Secondary | ICD-10-CM | POA: Insufficient documentation

## 2017-07-27 NOTE — Therapy (Signed)
[place], [place], [address] Phone: [phone]   Fax:  [phone]  Physical Therapy Treatment  Patient Details  Name: [name] MRN: [id number] Date of Birth: [birth year] Referring Provider: Blanchie Serve   Encounter Date: 07/27/2017      PT End of Session - 07/27/17 1305    Visit Number 12   Number of Visits 18   Date for PT Re-Evaluation 07/27/17   Authorization Type [employer] medicare   Authorization - Visit Number 12   Authorization - Number of Visits 18   PT Start Time 1300   PT Stop Time 1346   PT Time Calculation (min) 46 min   Equipment Utilized During Treatment Gait belt   Activity Tolerance Patient tolerated treatment well;No increased pain   Behavior During Therapy WFL for tasks assessed/performed      Past Medical History:  Diagnosis Date  . A-fib ([place])   . Arthritis    osteoarthritis  . Cataract   . Frequent falls   . Glaucoma   . High cholesterol   . Hypertension   . MI (myocardial infarction) ([place])   . Rheumatoid arthritis ([place])   . Thyroid disease     Past Surgical History:  Procedure Laterality Date  . CORONARY ANGIOPLASTY WITH STENT PLACEMENT    . JOINT REPLACEMENT     left knee  . REPLACEMENT TOTAL KNEE Left     There were no vitals filed for this visit.      Subjective Assessment - 07/27/17 1303    Subjective Pt stated he is feeling good today just a little tired today.  Stated he feels his balance and overall strength are improving, unsure of most current difficulty.   Patient Stated Goals not to fall    Currently in Pain? No/denies                         [facility] Adult PT Treatment/Exercise - 07/27/17 0001      Knee/Hip Exercises: Standing   Heel Raises Both;20 reps   Heel Raises Limitations toe raises on incline x20 reps    Forward Lunges 15 reps;Both   Forward Lunges Limitations on floor   Functional Squat 15 reps   Functional Squat  Limitations front of chair for mechanics   SLS x5   Other Standing Knee Exercises marching in place on foam x  10 with 1 finger aide             Balance Exercises - 07/27/17 1337      Balance Exercises: Standing   Tandem Stance Eyes open;Foam/compliant surface;3 reps;30 secs  1st set stable on foam; 2nd with head rotation 3rd: UE flexi   Balance Beam foam x3 RT, CGA    Sidestepping 2 reps;Theraband  RTB.   Step Over Hurdles / Cones 6 and 12in alternationg forward and sideways; c/o dizziness sat and BP taken 154/98 mmHg pulse at 54   Marching Limitations 10  on foam wiht 1 finger aide   Heel Raises Limitations 20   Toe Raise Limitations 20   Sit to Stand Time 10             PT Short Term Goals - 07/18/17 1413      PT SHORT TERM GOAL #1   Title Pt to be able to single leg stance on both LE for at least 10 seconds to reduce risk of falling    Baseline 7/20- 6  seconds best    Time 2   Period Weeks   Status Achieved     PT SHORT TERM GOAL #2   Title Pt LE strength to improve by 1/2 grade to be able to walk up and down 8 steps in a reciprocal manner without difficulty    Baseline 7/20- global improvement    Time 3   Period Weeks   Status Achieved     PT SHORT TERM GOAL #3   Title Pt to have had no falls in the past week    Baseline 7/20- fall on sunday    Time 3   Period Weeks   Status On-going           PT Long Term Goals - 07/18/17 1422      PT LONG TERM GOAL #1   Title Pt to be able to single leg balance on both legs for 15 seconds or greater to feel confident walking on uneven terrain.    Baseline 7/20- 6 second best ; 7/25 Rt 11 seconds LT 20    Time 4   Period Weeks   Status Partially Met     PT LONG TERM GOAL #2   Title Pt strength of LE to be improved by 1 grade to allow pt to be able to get in and out of a low car without difficulty    Baseline 7/20- improving but has not met goal    Time 4   Period Weeks   Status On-going     PT LONG  TERM GOAL #3   Title Pt to have no falls for the past 3 weeks    Baseline 7/20- fall on sunday    Time 4   Period Weeks   Status On-going     PT LONG TERM GOAL #4   Title Pt to be able to go up and down his steps with groceries in his hands in a reciprocal manner    Baseline 7/20- able but very unsteady    Time 4   Period Weeks   Status On-going               Plan - 07/27/17 1621    Clinical Impression Statement Continued session foucs on balance training and functional strengthening.  Pt progressing well with improve stability wtih task with min A/SBA with balance activities for safety.   Pt did c/o dizziness during tandem stance wiht head turns, BP taken at 154/98 mmHg.  no reports of dizziness at EOS.     Rehab Potential Good   PT Frequency 2x / week   PT Duration --  (9)   PT Treatment/Interventions ADLs/Self Care Home Management;Neuromuscular re-education;Gait training;Stair training;Functional mobility training;Therapeutic activities;Therapeutic exercise;Balance training   PT Next Visit Plan Continue functional hip strengthening and progress balance activity      Patient will benefit from skilled therapeutic intervention in order to improve the following deficits and impairments:  Abnormal gait, Decreased activity tolerance, Decreased balance, Pain, Decreased strength  Visit Diagnosis: Repeated falls  Muscle weakness (generalized)  Unsteadiness on feet     Problem List Patient Active Problem List   Diagnosis Date Noted  . Essential hypertension 04/30/2017  . Hypothyroid 04/30/2017  . HLD (hyperlipidemia) 04/30/2017  . CAD in native artery 04/30/2017  . 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 Pedal edema 04/30/2017  . Glaucoma 04/30/2017  . Abdominal aortic atherosclerosis ([place]) 04/30/2017  . 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 Asymptomatic gallstones  04/30/2017  . Benign prostatic hyperplasia 04/30/2017  . Diverticulosis  04/30/2017  . Esophagitis 04/30/2017  . Angular cheilitis 04/30/2017  . Mild memory disturbance 04/30/2017  . A-fib ([place]) 04/11/2017  . Rheumatoid arthritis ([place]) 04/11/2017   Ihor Austin, [place]; [place]  Aldona Lento 07/27/2017, 4:30 PM  [place], [place], [address] Phone: [phone]   Fax:  [phone]  Name: Ramere Downs MRN: [id number] Date of Birth: 01/25/1941

## 2017-07-31 ENCOUNTER — Ambulatory Visit (HOSPITAL_COMMUNITY): Payer: Medicare PPO

## 2017-07-31 DIAGNOSIS — R296 Repeated falls: Secondary | ICD-10-CM | POA: Diagnosis not present

## 2017-07-31 DIAGNOSIS — M6281 Muscle weakness (generalized): Secondary | ICD-10-CM

## 2017-07-31 DIAGNOSIS — R2681 Unsteadiness on feet: Secondary | ICD-10-CM

## 2017-07-31 NOTE — Progress Notes (Addendum)
Cardiology Office Note    Date:  07/31/2017  ID:  [name], DOB 06-Oct-1941, MRN [id number] PCP:  Derrick Everts, MD  Cardiologist:  Dr. Bronson Burgess   Chief Complaint: atrial fib  History of Present Illness:  [name] is a 76 y.o. male with history of CAD (remote angioplasty and stenting 2002 in [place]), chronic atrial fib, near-syncope, HTN, hyperlipidemia, rheumatoid arthritis, frequent falls who presents for 3 month follow-up. He saw Dr. Bronson Burgess in 03/2017 to establish care, previously followed at [facility]. 2D echo 04/2016 showed rhythm atrial flutter (?-do not have strips), trace AI/MR, mild TR, moderately dilated LA, normal LV size, thickness, wall motion and systolic function EF 19-14%.  At his visit with Dr. 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 He was started on Lasix 40mg  daily with KCl 67meq daily x 5 days then as needed. Last labs 04/2017 showed normla CMET except albumin 3.4, Hgb 12.9, LDL 48.  This visit was completed initially on paper, using my pre-note I had completed yesterday as our entire Epic computer system was down for several hours at time of the office visit. He states he was asked to f/u here by his PCP. He denies any recent cardiac symptoms including CP, SOB, nausea, diaphoresis, dizziness. He states he has had 3 in 6 months. He reports these are mechanical falls. In fact, he tripped coming into the hospital today on the upturned edge of a rug. He declined to go to the ED after it happened. Rapid response came and checked him out then brought him into our office since he he was coming to attend this appointment. He has a small abrasion on his left elbow which I cleaned and applied antibiotic ointment and bandaid. This was not actively bleeding. He had a small scrape on his right knee that was not bleeding and did not require any coverage. No dizziness, blurred vision, head injury. He jokes, "shall I  'drop in' anytime?" 2 EKGs were obtained by nurse, one with HR of 41 and one with HR of 48. He denies any acute symptoms. No bleeding issues recently. He brings in a copy of a dental consultation but he does not have any further details about what procedure is planned. Denies any alcohol use.   Past Medical History:  Diagnosis Date  . A-fib ([facility])   . Arthritis    osteoarthritis  . Cataract   . Frequent falls   . Glaucoma   . High cholesterol   . Hypertension   . MI (myocardial infarction) ([place])   . Rheumatoid arthritis ([place])   . Thyroid disease     Past Surgical History:  Procedure Laterality Date  . CORONARY ANGIOPLASTY WITH STENT PLACEMENT    . JOINT REPLACEMENT     left knee  . REPLACEMENT TOTAL KNEE Left     Current Medications: Prior to Admission medications   Medication Sig Start Date End Date Taking? Authorizing Provider  acetaminophen (TYLENOL) 650 MG CR tablet Take 650 mg by mouth every 8 (eight) hours as needed for pain.   Yes [provider]  folic acid (FOLVITE) 1 MG tablet Take 1 tablet (1 mg total) by mouth daily. 04/30/17  Yes Derrick Everts, MD  furosemide (LASIX) 40 MG tablet Take 40 mg daily for 5 days and then As NEEDED for leg swelling Patient taking differently: Take 40 mg by mouth daily as needed for fluid or edema. As NEEDED for  leg swelling 04/16/17  Yes Derrick Commons, MD  inFLIXimab (REMICADE) 100 MG injection Inject into the vein every 8 (eight) weeks.  02/24/15  Yes [provider]  levothyroxine (SYNTHROID, LEVOTHROID) 75 MCG tablet Take 1 tablet (75 mcg total) by mouth daily before breakfast. 04/30/17  Yes Derrick Everts, MD  lisinopril (PRINIVIL,ZESTRIL) 5 MG tablet TAKE ONE TABLET BY MOUTH ONCE DAILY 08/09/16  Yes [provider]  methotrexate 2.5 MG tablet Take 15 mg by mouth every Sunday. 6 tablets on Sunday   Yes [provider]  polycarbophil (FIBERCON) 625 MG tablet Take 625 mg by mouth at bedtime.    Yes [provider]  potassium chloride SA (K-DUR,KLOR-CON) 20 MEQ tablet Tale Potassium 20 meq daily for 5 days and then daily as NEEDED for leg swelling Patient taking differently: Take 20 mEq by mouth daily as needed. as NEEDED for leg swelling 04/16/17  Yes Derrick Commons, MD  rivaroxaban (XARELTO) 20 MG TABS tablet Take 1 tablet (20 mg total) by mouth daily with supper. Patient taking differently: Take 20 mg by mouth every morning.  04/16/17  Yes Derrick Commons, MD  simvastatin (ZOCOR) 40 MG tablet Take 1 tablet (40 mg total) by mouth daily. 04/30/17  Yes Derrick Everts, MD  timolol (TIMOPTIC) 0.5 % ophthalmic solution INSTILL ONE DROP INTO EACH EYE TWICE DAILY 12/15/13  Yes [provider]  + ON METOPROLOL 25MG  DAILY AT TIME OF OFFICE VISIT    Allergies:   Patient has no known allergies.   Social History   Social History  . Marital status: Widowed    Spouse name: N/A  . Number of children: 1  . Years of education: 18   Occupational History  . retired     Psychologist, counselling and first aid equip   Social History Main Topics  . Smoking status: Former Smoker    Years: 15.00    Types: Cigarettes    Quit date: 12/26/1975  . Smokeless tobacco: Never Used  . Alcohol use No  . Drug use: No  . Sexual activity: Not Currently   Other Topics Concern  . Not on file   Social History Narrative   Forensic psychologist    Widow   Lives alone   Lives near [name]/daughter     Family History:  Family History  Problem Relation Age of Onset  . Heart attack Father 64  . Early death Father   . Alzheimer's disease Mother 55  . Early death Sister        MVA  . Hyperlipidemia Brother   . Heart disease Brother     ROS:   Please see the history of present illness.  All other systems are reviewed and otherwise negative.    PHYSICAL EXAM:   VS:  BP 118/64   Pulse (!) 54   Ht 5\' 10"  (1.778 m)   Wt 185 lb (83.9 kg)   SpO2 97%   BMI 26.54 kg/m   BMI: Body mass  index is 26.54 kg/m. GEN: Well nourished, well developed WM, in no acute distress  HEENT: normocephalic, atraumatic, no abrasions or injuries Neck: no JVD, carotid bruits, or masses Cardiac: irregularly irregular, HR ~50 upon auscultation; no murmurs, rubs, or gallops, no edema  Respiratory:  clear to auscultation bilaterally, normal work of breathing GI: soft, nontender, nondistended, + BS MS: no deformity or atrophy  Skin: warm and dry, abrasions detailed above - no hematomas, swelling, or lacerations Neuro:  Alert  and Oriented x 3, Strength and sensation are intact, follows commands Psych: euthymic mood, full affect  Wt Readings from Last 3 Encounters:  07/10/17 184 lb 12 oz (83.8 kg)  05/28/17 171 lb 0.6 oz (77.6 kg)  05/18/17 180 lb (81.6 kg)      Studies/Labs Reviewed:   EKG:  EKG was ordered today and personally reviewed by me and demonstrates appear to be atrial flutter versus coarse AF, HR 41 then 48, no acute ST-T changes  Recent Labs: 04/30/2017: ALT 10; BUN 9; Creat 0.76; Hemoglobin 12.9; Platelets 239; Potassium 4.4; Sodium 138   Lipid Panel    Component Value Date/Time   CHOL 94 04/30/2017 1623   TRIG 73 04/30/2017 1623   HDL 31 (L) 04/30/2017 1623   CHOLHDL 3.0 04/30/2017 1623   VLDL 15 04/30/2017 1623   LDLCALC 48 04/30/2017 1623    Additional studies/ records that were reviewed today include: Summarized above    ASSESSMENT & PLAN:   1. Chronic atrial fibrillation/atrial flutter - slow ventricular response today but generally asymptomatic. At time of OV, did not have prior tracings to compare to but this was said to be chronic. Upon review of chart once system back online, prior HR in 05/2016 was 51. He declined to go to the ED. Will stop metoprolol. This does not appear in med list above because note was generated after Epic came back online, but he affirmed he had been taking it at home. Have d/c'd off med list. Check lytes and TSH. Also check CBC given h/o  falling. If he has another fall, will need to reconsider anticoagulation. However, he states after the first 2 happened, he became more aware of falling and now uses a cane. I am hopeful that maybe by stopping his beta blocker, his HR will be more robust and perhaps he will feel more steady on his feet. He is also working with PT. have asked office to contact oral surgeon office tomorrow when computer system is up and running again to get more details regarding his upcoming procedure; will defer to Dr. Bronson Burgess to review as I will be out of the office again until next Wednesday. 2. CAD - no recent anginal symptoms. 3. Essential HTN - follow with cessation of BB. 4. Hyperlipidemia - followed by primary care 5. Macrocytosis - denies EtOH. Further workup per PCP.  Disposition: F/u with [facility] APP/MD 7-10 days for HR recheck.Since computer system was down, nurse typed up instructions on WordPad to give to patient as AVS was unable to be accessed.  Medication Adjustments/Labs and Tests Ordered: Current medicines are reviewed at length with the patient today.  Concerns regarding medicines are outlined above. Medication changes, Labs and Tests ordered today are summarized above and listed in the Patient Instructions accessible in Encounters.   Signed, Charlie Pitter, PA-C  07/31/2017 1:51 PM    [facility] Location in [place]. [place], [place] [address] Ph: [phone]; Fax [phone]

## 2017-07-31 NOTE — Patient Instructions (Signed)
SINGLE LIMB STANCE    Stance: single leg on floor. Raise leg. Hold 30 seconds. Repeat with other leg. 3-5 reps per per day, 6 days per week  Copyright  VHI. All rights reserved. .   ABDUCTION: Standing (Active)    Standing on one leg bring the opposite leg forward, out to side then behind holding for 5 second each.  Can use one hand held assistance for balance assistance.     http://gtsc.exer.us/110   Copyright  VHI. All rights reserved.

## 2017-07-31 NOTE — Addendum Note (Signed)
Addended by: Hunt Oris on: 07/31/2017 05:48 PM   Modules accepted: Orders

## 2017-07-31 NOTE — Therapy (Signed)
[facility] [place], [place], [address] Phone: [phone]   Fax:  [phone]  Physical Therapy Treatment  Patient Details  Name: [name] MRN: [id number] Date of Birth: March 12, 1941 Referring Provider: Blanchie Serve   Encounter Date: 07/31/2017      PT End of Session - 07/31/17 1115    Visit Number 13  3 more session per PT on 7/25   Number of Visits 18   Date for PT Re-Evaluation 07/27/17   Authorization Type [employer] medicare   Authorization - Visit Number 13   Authorization - Number of Visits 18   PT Start Time 6644   PT Stop Time 1205   PT Time Calculation (min) 52 min   Equipment Utilized During Treatment Gait belt   Activity Tolerance Patient tolerated treatment well;No increased pain   Behavior During Therapy WFL for tasks assessed/performed      Past Medical History:  Diagnosis Date  . A-fib ([place])   . Arthritis    osteoarthritis  . Cataract   . Frequent falls   . Glaucoma   . High cholesterol   . Hypertension   . MI (myocardial infarction) ([place])   . Rheumatoid arthritis ([place])   . Thyroid disease     Past Surgical History:  Procedure Laterality Date  . CORONARY ANGIOPLASTY WITH STENT PLACEMENT    . JOINT REPLACEMENT     left knee  . REPLACEMENT TOTAL KNEE Left     There were no vitals filed for this visit.      Subjective Assessment - 07/31/17 1110    Subjective Pt stated he is feeling good today, a little more energized at entrance.  No reports of pain or recent falls.     Pertinent History DJD lumbar, HTN, CAD, HTN; fx collar bone.    Patient Stated Goals not to fall    Currently in Pain? No/denies                         [facility] Adult PT Treatment/Exercise - 07/31/17 0001      Knee/Hip Exercises: Standing   Heel Raises Both;20 reps   Heel Raises Limitations toe raises on incline x20 reps    Forward Lunges 15 reps;Both   Forward Lunges Limitations on floor   Functional Squat 15 reps   Functional Squat Limitations front of chair for mechanics   SLS Lt 20", Rt 20" max of 5   SLS with Vectors 2x 5" BLE with 1 HHA   Other Standing Knee Exercises marching in place with opposite UE flexion 10x     Knee/Hip Exercises: Seated   Sit to Sand without UE support;10 reps  cueing for eccentric control             Balance Exercises - 07/31/17 1211      Balance Exercises: Standing   Tandem Stance Eyes open;Foam/compliant surface;3 reps;30 secs   Balance Beam tandem and sidestep 2RT   Sidestepping 2 reps;Theraband  RTB             PT Short Term Goals - 07/18/17 1413      PT SHORT TERM GOAL #1   Title Pt to be able to single leg stance on both LE for at least 10 seconds to reduce risk of falling    Baseline 7/20- 6 seconds best    Time 2   Period Weeks   Status Achieved     PT SHORT  TERM GOAL #2   Title Pt LE strength to improve by 1/2 grade to be able to walk up and down 8 steps in a reciprocal manner without difficulty    Baseline 7/20- global improvement    Time 3   Period Weeks   Status Achieved     PT SHORT TERM GOAL #3   Title Pt to have had no falls in the past week    Baseline 7/20- fall on sunday    Time 3   Period Weeks   Status On-going           PT Long Term Goals - 07/18/17 1422      PT LONG TERM GOAL #1   Title Pt to be able to single leg balance on both legs for 15 seconds or greater to feel confident walking on uneven terrain.    Baseline 7/20- 6 second best ; 7/25 Rt 11 seconds LT 20    Time 4   Period Weeks   Status Partially Met     PT LONG TERM GOAL #2   Title Pt strength of LE to be improved by 1 grade to allow pt to be able to get in and out of a low car without difficulty    Baseline 7/20- improving but has not met goal    Time 4   Period Weeks   Status On-going     PT LONG TERM GOAL #3   Title Pt to have no falls for the past 3 weeks    Baseline 7/20- fall on sunday    Time 4   Period  Weeks   Status On-going     PT LONG TERM GOAL #4   Title Pt to be able to go up and down his steps with groceries in his hands in a reciprocal manner    Baseline 7/20- able but very unsteady    Time 4   Period Weeks   Status On-going               Plan - 07/31/17 1212    Clinical Impression Statement Added vector stance to improve SLS and hip strengthening.  Improving SLS,  Pt continues to required min A with balance activties on dynamic surface and SLS activities.  No reports of pain or dizziness this session.  Was limted by fatigue with activities.  Reviewed HEP with additional SLS to program    Rehab Potential Good   PT Frequency 2x / week   PT Duration --  3 more sessions    PT Treatment/Interventions ADLs/Self Care Home Management;Neuromuscular re-education;Gait training;Stair training;Functional mobility training;Therapeutic activities;Therapeutic exercise;Balance training   PT Next Visit Plan Continue functional hip strengthening and progress balance activity   PT Home Exercise Plan heel raises, bent knee raise, bridge, sidelying abduction, SLS       Patient will benefit from skilled therapeutic intervention in order to improve the following deficits and impairments:  Abnormal gait, Decreased activity tolerance, Decreased balance, Pain, Decreased strength  Visit Diagnosis: Repeated falls  Muscle weakness (generalized)  Unsteadiness on feet     Problem List Patient Active Problem List   Diagnosis Date Noted  . Essential hypertension 04/30/2017  . Hypothyroid 04/30/2017  . HLD (hyperlipidemia) 04/30/2017  . CAD in native artery 04/30/2017  . 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 Pedal edema 04/30/2017  . Glaucoma 04/30/2017  . Abdominal aortic atherosclerosis ([place]) 04/30/2017  . 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 Asymptomatic gallstones 04/30/2017  .  Benign prostatic hyperplasia 04/30/2017  . Diverticulosis 04/30/2017  .  Esophagitis 04/30/2017  . Angular cheilitis 04/30/2017  . Mild memory disturbance 04/30/2017  . A-fib ([place]) 04/11/2017  . Rheumatoid arthritis ([place]) 04/11/2017   Ihor Austin, [place]; [place]  Aldona Lento 07/31/2017, 12:15 PM  [place], [place], [address] Phone: [phone]   Fax:  [phone]  Name: [name] MRN: [id number] Date of Birth: 27-Jun-1941

## 2017-08-01 ENCOUNTER — Encounter: Payer: Self-pay | Admitting: Physician Assistant

## 2017-08-01 ENCOUNTER — Ambulatory Visit (INDEPENDENT_AMBULATORY_CARE_PROVIDER_SITE_OTHER): Payer: Medicare PPO | Admitting: Physician Assistant

## 2017-08-01 VITALS — BP 118/64 | HR 54 | Ht 70.0 in | Wt 185.0 lb

## 2017-08-01 DIAGNOSIS — E785 Hyperlipidemia, unspecified: Secondary | ICD-10-CM | POA: Diagnosis not present

## 2017-08-01 DIAGNOSIS — I482 Chronic atrial fibrillation, unspecified: Secondary | ICD-10-CM

## 2017-08-01 DIAGNOSIS — D7589 Other specified diseases of blood and blood-forming organs: Secondary | ICD-10-CM | POA: Diagnosis not present

## 2017-08-01 DIAGNOSIS — I10 Essential (primary) hypertension: Secondary | ICD-10-CM | POA: Diagnosis not present

## 2017-08-01 DIAGNOSIS — I2510 Atherosclerotic heart disease of native coronary artery without angina pectoris: Secondary | ICD-10-CM | POA: Diagnosis not present

## 2017-08-01 NOTE — Patient Instructions (Signed)
Medication Instructions:  Your physician has recommended you make the following change in your medication:  Stop taking Metoprolol    Labwork: Your physician recommends that you return for lab work in: Today ( CBC,BMET, TSH) You have been given a paper Rx request.    Testing/Procedures: NONE  Follow-Up: Your physician recommends that you schedule a follow-up appointment in: 7-10 Days    Any Other Special Instructions Will Be Listed Below (If Applicable).  We will call [facility] for details on your upcoming surgery    If you need a refill on your cardiac medications before your next appointment, please call your pharmacy.

## 2017-08-02 ENCOUNTER — Ambulatory Visit (HOSPITAL_COMMUNITY): Payer: Medicare PPO

## 2017-08-02 ENCOUNTER — Other Ambulatory Visit: Payer: Self-pay | Admitting: Physician Assistant

## 2017-08-02 DIAGNOSIS — M6281 Muscle weakness (generalized): Secondary | ICD-10-CM

## 2017-08-02 DIAGNOSIS — R296 Repeated falls: Secondary | ICD-10-CM

## 2017-08-02 DIAGNOSIS — R2681 Unsteadiness on feet: Secondary | ICD-10-CM

## 2017-08-02 LAB — CBC
HEMATOCRIT: 36.7 % — AB (ref 38.5–50.0)
HEMOGLOBIN: 12.1 g/dL — AB (ref 13.2–17.1)
MCH: 33.4 pg — ABNORMAL HIGH (ref 27.0–33.0)
MCHC: 33 g/dL (ref 32.0–36.0)
MCV: 101.4 fL — ABNORMAL HIGH (ref 80.0–100.0)
MPV: 9.7 fL (ref 7.5–12.5)
Platelets: 200 10*3/uL (ref 140–400)
RBC: 3.62 MIL/uL — ABNORMAL LOW (ref 4.20–5.80)
RDW: 14 % (ref 11.0–15.0)
WBC: 6.6 10*3/uL (ref 3.8–10.8)

## 2017-08-02 LAB — BASIC METABOLIC PANEL
BUN: 10 mg/dL (ref 7–25)
CHLORIDE: 103 mmol/L (ref 98–110)
CO2: 24 mmol/L (ref 20–32)
Calcium: 8.8 mg/dL (ref 8.6–10.3)
Creat: 0.82 mg/dL (ref 0.70–1.18)
Glucose, Bld: 91 mg/dL (ref 65–99)
Potassium: 4.2 mmol/L (ref 3.5–5.3)
SODIUM: 139 mmol/L (ref 135–146)

## 2017-08-02 LAB — TSH: TSH: 2.3 mIU/L (ref 0.40–4.50)

## 2017-08-02 NOTE — Therapy (Signed)
[facility] [address] [place], [place], [address] Phone: [phone]   Fax:  [phone]  Physical Therapy Treatment  Patient Details  Name: [name] MRN: [id number] Date of Birth: June 17, 1941 Referring Provider: Rica Mast   Encounter Date: 08/02/2017      PT End of Session - 08/02/17 1313    Visit Number 14  2 more session per PT POC on 7/25   Number of Visits 18   Date for PT Re-Evaluation 07/27/17   Authorization Type [employer] medicare   Authorization - Visit Number 14   Authorization - Number of Visits 18   PT Start Time 1120   PT Stop Time 1200   PT Time Calculation (min) 40 min   Equipment Utilized During Treatment Gait belt   Activity Tolerance Patient tolerated treatment well;No increased pain   Behavior During Therapy WFL for tasks assessed/performed      Past Medical History:  Diagnosis Date  . A-fib ([facility])   . Arthritis    osteoarthritis  . Cataract   . Frequent falls   . Glaucoma   . High cholesterol   . Hypertension   . MI (myocardial infarction) ([facility])   . Rheumatoid arthritis ([facility])   . Thyroid disease     Past Surgical History:  Procedure Laterality Date  . CORONARY ANGIOPLASTY WITH STENT PLACEMENT    . JOINT REPLACEMENT     left knee  . REPLACEMENT TOTAL KNEE Left     There were no vitals filed for this visit.      Subjective Assessment - 08/02/17 1126    Subjective Pt stated he went to hospital yesterday for cardiologist apt and fell at entrance.  No reports of pain, did have scratches on Lt elbow                         [facility] Adult PT Treatment/Exercise - 08/02/17 0001      Knee/Hip Exercises: Standing   Heel Raises Both;20 reps   Heel Raises Limitations toe raises on incline x20 reps    Forward Lunges 20 reps   Forward Lunges Limitations on floor   Functional Squat 20 reps   Functional Squat Limitations front of chair for mechanics   Stairs 5RT alternating 1  HHA   SLS Lt 6", Rt 13'   SLS with Vectors 3x 5" BLE with 1 HHA   Other Standing Knee Exercises marching in place with opposite UE flexion 10x             Balance Exercises - 08/02/17 1152      Balance Exercises: Standing   Tandem Stance Eyes open;Foam/compliant surface;3 reps;30 secs   SLS Eyes open;3 reps   SLS with Vectors Solid surface;3 reps;Intermittent upper extremity assist  BLE 3x 5" holds    Balance Beam tandem and sidestep 2RT   Step Over Hurdles / Cones 6 and 12in alternationg forward and sideways; c/o dizziness sat and BP taken 154/98 mmHg pulse at 54             PT Short Term Goals - 07/18/17 1413      PT SHORT TERM GOAL #1   Title Pt to be able to single leg stance on both LE for at least 10 seconds to reduce risk of falling    Baseline 7/20- 6 seconds best    Time 2   Period Weeks   Status Achieved     PT SHORT  TERM GOAL #2   Title Pt LE strength to improve by 1/2 grade to be able to walk up and down 8 steps in a reciprocal manner without difficulty    Baseline 7/20- global improvement    Time 3   Period Weeks   Status Achieved     PT SHORT TERM GOAL #3   Title Pt to have had no falls in the past week    Baseline 7/20- fall on sunday    Time 3   Period Weeks   Status On-going           PT Long Term Goals - 07/18/17 1422      PT LONG TERM GOAL #1   Title Pt to be able to single leg balance on both legs for 15 seconds or greater to feel confident walking on uneven terrain.    Baseline 7/20- 6 second best ; 7/25 Rt 11 seconds LT 20    Time 4   Period Weeks   Status Partially Met     PT LONG TERM GOAL #2   Title Pt strength of LE to be improved by 1 grade to allow pt to be able to get in and out of a low car without difficulty    Baseline 7/20- improving but has not met goal    Time 4   Period Weeks   Status On-going     PT LONG TERM GOAL #3   Title Pt to have no falls for the past 3 weeks    Baseline 7/20- fall on sunday    Time  4   Period Weeks   Status On-going     PT LONG TERM GOAL #4   Title Pt to be able to go up and down his steps with groceries in his hands in a reciprocal manner    Baseline 7/20- able but very unsteady    Time 4   Period Weeks   Status On-going               Plan - 08/02/17 1313    Clinical Impression Statement Continued session focus with functional strenghtening and balance training.  Min A with balance activities with dynamic surfaces and SLS activties.  Improved activity tolerance with minimal rest breaks required.  Discussion held with importance of improving awareness of obstacles to reduce risk of fall.   Rehab Potential Good   PT Frequency 2x / week   PT Duration --  2 more sessions   PT Treatment/Interventions ADLs/Self Care Home Management;Neuromuscular re-education;Gait training;Stair training;Functional mobility training;Therapeutic activities;Therapeutic exercise;Balance training   PT Next Visit Plan Continue functional hip strengthening and progress balance activity      Patient will benefit from skilled therapeutic intervention in order to improve the following deficits and impairments:  Abnormal gait, Decreased activity tolerance, Decreased balance, Pain, Decreased strength  Visit Diagnosis: Repeated falls  Muscle weakness (generalized)  Unsteadiness on feet     Problem List Patient Active Problem List   Diagnosis Date Noted  . Essential hypertension 04/30/2017  . Hypothyroid 04/30/2017  . HLD (hyperlipidemia) 04/30/2017  . CAD in native artery 04/30/2017  . 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 Pedal edema 04/30/2017  . Glaucoma 04/30/2017  . Abdominal aortic atherosclerosis ([place]) 04/30/2017  . 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 Asymptomatic gallstones 04/30/2017  . Benign prostatic hyperplasia 04/30/2017  . Diverticulosis 04/30/2017  . Esophagitis 04/30/2017  . Angular cheilitis 04/30/2017  . Mild memory  disturbance  04/30/2017  . A-fib ([place]) 04/11/2017  . Rheumatoid arthritis ([place]) 04/11/2017   Ihor Austin, [place]; [place]  Aldona Lento 08/02/2017, 2:21 PM  [place], [place], [address] Phone: [phone]   Fax:  [phone]  Name: Azazel Franze MRN: [id number] Date of Birth: [birth year]

## 2017-08-06 ENCOUNTER — Telehealth (HOSPITAL_COMMUNITY): Payer: Self-pay | Admitting: Family Medicine

## 2017-08-06 ENCOUNTER — Ambulatory Visit (INDEPENDENT_AMBULATORY_CARE_PROVIDER_SITE_OTHER): Payer: Medicare PPO | Admitting: Family Medicine

## 2017-08-06 ENCOUNTER — Encounter: Payer: Self-pay | Admitting: Family Medicine

## 2017-08-06 ENCOUNTER — Telehealth: Payer: Self-pay | Admitting: Family Medicine

## 2017-08-06 VITALS — BP 124/70 | HR 64 | Temp 97.8°F | Resp 18 | Ht 70.0 in | Wt 185.1 lb

## 2017-08-06 DIAGNOSIS — H6123 Impacted cerumen, bilateral: Secondary | ICD-10-CM | POA: Diagnosis not present

## 2017-08-06 DIAGNOSIS — H9202 Otalgia, left ear: Secondary | ICD-10-CM | POA: Diagnosis not present

## 2017-08-06 MED ORDER — NEOMYCIN-POLYMYXIN-HC 3.5-10000-1 OT SOLN: 3.0000 [drp] | Freq: Four times a day (QID) | OTIC | 0 refills | Status: DC

## 2017-08-06 NOTE — Telephone Encounter (Signed)
Called and spoke to [place], aware.

## 2017-08-06 NOTE — Progress Notes (Signed)
Chief Complaint  Patient presents with  . Ear Pain    left x 24 hours   Came in as a walk in  Ear pain L with decreased hearing No trauma. No URI, no water in ear or swimming  Patient Active Problem List   Diagnosis Date Noted  . Essential hypertension 04/30/2017  . Hypothyroid 04/30/2017  . HLD (hyperlipidemia) 04/30/2017  . CAD in native artery 04/30/2017  . 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 Pedal edema 04/30/2017  . Glaucoma 04/30/2017  . Abdominal aortic atherosclerosis ([place]) 04/30/2017  . 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 Asymptomatic gallstones 04/30/2017  . Benign prostatic hyperplasia 04/30/2017  . Diverticulosis 04/30/2017  . Esophagitis 04/30/2017  . Angular cheilitis 04/30/2017  . Mild memory disturbance 04/30/2017  . A-fib ([place]) 04/11/2017  . Rheumatoid arthritis ([place]) 04/11/2017    Outpatient Encounter Prescriptions as of 08/06/2017  Medication Sig  . acetaminophen (TYLENOL) 650 MG CR tablet Take 650 mg by mouth every 8 (eight) hours as needed for pain.  . folic acid (FOLVITE) 1 MG tablet Take 1 tablet (1 mg total) by mouth daily.  . furosemide (LASIX) 40 MG tablet Take 40 mg daily for 5 days and then As NEEDED for leg swelling (Patient taking differently: Take 40 mg by mouth daily as needed for fluid or edema. As NEEDED for leg swelling)  . inFLIXimab (REMICADE) 100 MG injection Inject into the vein every 8 (eight) weeks.   Marland Kitchen levothyroxine (SYNTHROID, LEVOTHROID) 75 MCG tablet Take 1 tablet (75 mcg total) by mouth daily before breakfast.  . lisinopril (PRINIVIL,ZESTRIL) 5 MG tablet TAKE ONE TABLET BY MOUTH ONCE DAILY  . methotrexate 2.5 MG tablet Take 15 mg by mouth every Sunday. 6 tablets on Sunday  . polycarbophil (FIBERCON) 625 MG tablet Take 625 mg by mouth at bedtime.  . potassium chloride SA (K-DUR,KLOR-CON) 20 MEQ tablet Tale Potassium 20 meq daily for 5 days and then daily as NEEDED for leg swelling (Patient  taking differently: Take 20 mEq by mouth daily as needed. as NEEDED for leg swelling)  . rivaroxaban (XARELTO) 20 MG TABS tablet Take 1 tablet (20 mg total) by mouth daily with supper. (Patient taking differently: Take 20 mg by mouth every morning. )  . simvastatin (ZOCOR) 40 MG tablet Take 1 tablet (40 mg total) by mouth daily.  . timolol (TIMOPTIC) 0.5 % ophthalmic solution INSTILL ONE DROP INTO EACH EYE TWICE DAILY  . neomycin-polymyxin-hydrocortisone (CORTISPORIN) OTIC solution Place 3 drops into both ears 4 (four) times daily.   No facility-administered encounter medications on file as of 08/06/2017.     No Known Allergies  Review of Systems  Constitutional: Negative for activity change, appetite change, chills and fever.  HENT: Positive for ear pain and hearing loss. Negative for congestion, postnasal drip, rhinorrhea, sore throat and tinnitus.   Eyes: Negative for redness and visual disturbance.  Respiratory: Negative for cough and shortness of breath.   All other systems reviewed and are negative. chronic complaints unchanged   BP 124/70 (BP Location: Right Arm, Patient Position: Sitting, Cuff Size: Normal)   Pulse 64   Temp 97.8 F (36.6 C) (Temporal)   Resp 18   Ht 5\' 10"  (1.778 m)   Wt 185 lb 1.9 oz (84 kg)   SpO2 99%   BMI 26.56 kg/m   Physical Exam  Constitutional: He appears well-developed and well-nourished. No distress.  Unsteady gait.  Hard of hearing  HENT:  Head: Normocephalic and atraumatic.  Nose: Nose normal.  Mouth/Throat: Oropharynx is clear and moist.  Ear canals are narrow.  Cerumen bilat that is irrigated and curetted.  Patient tolerated and felt improved  Eyes: Pupils are equal, round, and reactive to light. Conjunctivae are normal.  Neck: Normal range of motion.  Cardiovascular: Normal rate, regular rhythm and normal heart sounds.   Pulmonary/Chest: Effort normal and breath sounds normal.  Lymphadenopathy:    He has no cervical adenopathy.    Neurological: He is alert.  Psychiatric: He has a normal mood and affect. His behavior is normal.    ASSESSMENT/PLAN:  1. Hearing loss due to cerumen impaction, bilateral - Ear Lavage  2. Ear pain, left   Patient Instructions  Call if ear pain does not improve - may need ENT referral  Use the ear drips for a few days to see if this helps discomfort.   Raylene Everts, MD

## 2017-08-06 NOTE — Telephone Encounter (Signed)
Patient was here for an appt this morning, but forgot to ask if it is ok for him to proceed with the Remicade infusion this coming Wednesday.  Please call and advise.    cb  [phone]

## 2017-08-06 NOTE — Telephone Encounter (Signed)
sure

## 2017-08-06 NOTE — Patient Instructions (Addendum)
Call if ear pain does not improve - may need ENT referral  Use the ear drips for a few days to see if this helps discomfort.

## 2017-08-06 NOTE — Telephone Encounter (Signed)
Left message to call office

## 2017-08-06 NOTE — Telephone Encounter (Signed)
8/13 pt came by and he wants to change his appts .Marland Kitchen... not come this week but perhaps come 3x next week.... he said he had to go home and check his calendar and he will call back.   MM

## 2017-08-07 ENCOUNTER — Telehealth: Payer: Self-pay | Admitting: Family Medicine

## 2017-08-07 ENCOUNTER — Other Ambulatory Visit: Payer: Self-pay | Admitting: Family Medicine

## 2017-08-07 ENCOUNTER — Ambulatory Visit (HOSPITAL_COMMUNITY): Payer: Medicare PPO | Admitting: Physical Therapy

## 2017-08-07 DIAGNOSIS — H9202 Otalgia, left ear: Secondary | ICD-10-CM

## 2017-08-07 NOTE — Telephone Encounter (Signed)
Referral has been faxed to Dr Benjamine Mola

## 2017-08-07 NOTE — Telephone Encounter (Signed)
Patient came by office to let say that the drops are not working and the Left ear is now more painful than yesterday.  He is requesting a referral to ENT ASAP.  If not someone in [place] (soon) then [place] is ok.

## 2017-08-07 NOTE — Telephone Encounter (Signed)
Urgent ENT order placed

## 2017-08-08 ENCOUNTER — Telehealth: Payer: Self-pay

## 2017-08-08 ENCOUNTER — Ambulatory Visit: Payer: Medicare PPO | Admitting: Physician Assistant

## 2017-08-08 NOTE — Telephone Encounter (Signed)
Spoke to leslie, aware of mds advice.

## 2017-08-08 NOTE — Telephone Encounter (Signed)
mucinex DM, flonase, nasal saline or coricidin HRP are all safe to try

## 2017-08-08 NOTE — Telephone Encounter (Signed)
Daughter Magda Paganini called and was given the number to Teoh's office to check on status of referral and she is wanting to know what you recommend for sinus congestion and pressure in his head. She was wanting to know if it was ok to give him sudafed, coricidin HBP or flonase Please advise

## 2017-08-09 ENCOUNTER — Ambulatory Visit (HOSPITAL_COMMUNITY): Payer: Medicare PPO | Admitting: Physical Therapy

## 2017-08-14 ENCOUNTER — Ambulatory Visit (HOSPITAL_COMMUNITY): Payer: Medicare PPO

## 2017-08-14 DIAGNOSIS — R2681 Unsteadiness on feet: Secondary | ICD-10-CM

## 2017-08-14 DIAGNOSIS — M6281 Muscle weakness (generalized): Secondary | ICD-10-CM

## 2017-08-14 DIAGNOSIS — R296 Repeated falls: Secondary | ICD-10-CM

## 2017-08-14 NOTE — Therapy (Signed)
[place], [place], [address] Phone: [phone]   Fax:  [phone]  Physical Therapy Treatment  Patient Details  Name: [name] MRN: [id number] Date of Birth: [birth year] Referring Provider: Blanchie Serve   Encounter Date: 08/14/2017      PT End of Session - 08/14/17 1118    Visit Number 15  1 more session per PT re-eval 7/25   Number of Visits 18   Date for PT Re-Evaluation 07/27/17   Authorization Type [employer] medicare   Authorization - Visit Number 15   Authorization - Number of Visits 18   PT Start Time 0998   PT Stop Time 1200   PT Time Calculation (min) 44 min   Equipment Utilized During Treatment Gait belt   Activity Tolerance Patient tolerated treatment well;No increased pain   Behavior During Therapy WFL for tasks assessed/performed      Past Medical History:  Diagnosis Date  . A-fib ([place])   . Arthritis    osteoarthritis  . Cataract   . Frequent falls   . Glaucoma   . High cholesterol   . Hypertension   . MI (myocardial infarction) ([place])   . Rheumatoid arthritis ([place])   . Thyroid disease     Past Surgical History:  Procedure Laterality Date  . CORONARY ANGIOPLASTY WITH STENT PLACEMENT    . JOINT REPLACEMENT     left knee  . REPLACEMENT TOTAL KNEE Left     There were no vitals filed for this visit.      Subjective Assessment - 08/14/17 1117    Subjective Pt stated he continues to have difficutly with sinuses and hearing.  No reports of pain today or recent falls   Pertinent History DJD lumbar, HTN, CAD, HTN; fx collar bone.    Patient Stated Goals not to fall    Currently in Pain? No/denies                         [facility] Adult PT Treatment/Exercise - 08/14/17 0001      Knee/Hip Exercises: Standing   Heel Raises 2 sets;20 reps   Heel Raises Limitations proper lifting then heel raise   Functional Squat 15 reps   Functional Squat Limitations proper  lifting then heel raise   Stairs 1RT reciprocal pattern no HHA ascending, 1 HHA descending   SLS Lt 4", Rt 10: max of 3   SLS with Vectors 3x 5" BLE with 1 HHA             Balance Exercises - 08/14/17 1216      Balance Exercises: Standing   Tandem Stance Eyes open;3 reps;30 secs  1st set with BUE; 2nd head turns; 3rd throw/catch on foam   SLS Eyes open;3 reps   Balance Beam tandem and sidestep 2RT   Step Over Hurdles / Cones time- resume next session   Other Standing Exercises catch/throw tandem stance 2 sets 10 reps; boom waker tandem stance 2 sets             PT Short Term Goals - 07/18/17 1413      PT SHORT TERM GOAL #1   Title Pt to be able to single leg stance on both LE for at least 10 seconds to reduce risk of falling    Baseline 7/20- 6 seconds best    Time 2   Period Weeks   Status Achieved     PT SHORT  TERM GOAL #2   Title Pt LE strength to improve by 1/2 grade to be able to walk up and down 8 steps in a reciprocal manner without difficulty    Baseline 7/20- global improvement    Time 3   Period Weeks   Status Achieved     PT SHORT TERM GOAL #3   Title Pt to have had no falls in the past week    Baseline 7/20- fall on sunday    Time 3   Period Weeks   Status On-going           PT Long Term Goals - 07/18/17 1422      PT LONG TERM GOAL #1   Title Pt to be able to single leg balance on both legs for 15 seconds or greater to feel confident walking on uneven terrain.    Baseline 7/20- 6 second best ; 7/25 Rt 11 seconds LT 20    Time 4   Period Weeks   Status Partially Met     PT LONG TERM GOAL #2   Title Pt strength of LE to be improved by 1 grade to allow pt to be able to get in and out of a low car without difficulty    Baseline 7/20- improving but has not met goal    Time 4   Period Weeks   Status On-going     PT LONG TERM GOAL #3   Title Pt to have no falls for the past 3 weeks    Baseline 7/20- fall on sunday    Time 4   Period  Weeks   Status On-going     PT LONG TERM GOAL #4   Title Pt to be able to go up and down his steps with groceries in his hands in a reciprocal manner    Baseline 7/20- able but very unsteady    Time 4   Period Weeks   Status On-going               Plan - 08/14/17 1207    Clinical Impression Statement Added dynamic movements to improve balance with min A for safety and cueing to improve spatial awareness with tasks.   Rehab Potential Good   PT Frequency 2x / week   PT Duration --  1 more session   PT Treatment/Interventions ADLs/Self Care Home Management;Neuromuscular re-education;Gait training;Stair training;Functional mobility training;Therapeutic activities;Therapeutic exercise;Balance training   PT Next Visit Plan Reassess next session.        Patient will benefit from skilled therapeutic intervention in order to improve the following deficits and impairments:  Abnormal gait, Decreased activity tolerance, Decreased balance, Pain, Decreased strength  Visit Diagnosis: Muscle weakness (generalized)  Unsteadiness on feet  Repeated falls     Problem List Patient Active Problem List   Diagnosis Date Noted  . Essential hypertension 04/30/2017  . Hypothyroid 04/30/2017  . HLD (hyperlipidemia) 04/30/2017  . CAD in native artery 04/30/2017  . 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 Pedal edema 04/30/2017  . Glaucoma 04/30/2017  . Abdominal aortic atherosclerosis ([place]) 04/30/2017  . 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 Asymptomatic gallstones 04/30/2017  . Benign prostatic hyperplasia 04/30/2017  . Diverticulosis 04/30/2017  . Esophagitis 04/30/2017  . Angular cheilitis 04/30/2017  . Mild memory disturbance 04/30/2017  . A-fib ([place]) 04/11/2017  . Rheumatoid arthritis ([place]) 04/11/2017   Ihor Austin, LPTA; [place]  Aldona Lento 08/14/2017, 12:20 PM  [place]  [facility] [place], [place], [address] Phone: [phone]   Fax:  [phone]  Name: [name] MRN: [id number] Date of Birth: 1941/03/11

## 2017-08-16 ENCOUNTER — Ambulatory Visit (INDEPENDENT_AMBULATORY_CARE_PROVIDER_SITE_OTHER): Payer: Medicare PPO | Admitting: Adult Health

## 2017-08-16 ENCOUNTER — Encounter: Payer: Self-pay | Admitting: Adult Health

## 2017-08-16 ENCOUNTER — Ambulatory Visit (HOSPITAL_COMMUNITY): Payer: Medicare PPO | Admitting: Physical Therapy

## 2017-08-16 VITALS — BP 110/62 | HR 72 | Ht 69.0 in | Wt 181.0 lb

## 2017-08-16 DIAGNOSIS — I482 Chronic atrial fibrillation, unspecified: Secondary | ICD-10-CM

## 2017-08-16 DIAGNOSIS — R296 Repeated falls: Secondary | ICD-10-CM

## 2017-08-16 DIAGNOSIS — M6281 Muscle weakness (generalized): Secondary | ICD-10-CM

## 2017-08-16 DIAGNOSIS — I10 Essential (primary) hypertension: Secondary | ICD-10-CM

## 2017-08-16 DIAGNOSIS — I2510 Atherosclerotic heart disease of native coronary artery without angina pectoris: Secondary | ICD-10-CM | POA: Diagnosis not present

## 2017-08-16 DIAGNOSIS — R2681 Unsteadiness on feet: Secondary | ICD-10-CM

## 2017-08-16 MED ORDER — FUROSEMIDE 40 MG PO TABS: 40.0000 mg | ORAL_TABLET | Freq: Every day | ORAL | 6 refills | Status: DC | PRN

## 2017-08-16 MED ORDER — LISINOPRIL 2.5 MG PO TABS: 2.5000 mg | ORAL_TABLET | Freq: Every day | ORAL | 3 refills | Status: DC

## 2017-08-16 NOTE — Therapy (Signed)
[place], [place], [address] Phone: [phone]   Fax:  [phone]  Physical Therapy Treatment/Discharge  Patient Details  Name: [name] MRN: [id number] Date of Birth: 11-09-1941 Referring Provider: Blanchie Serve   Encounter Date: 08/16/2017      PT End of Session - 08/16/17 1149    Visit Number 17     Number of Visits 18   Date for PT Re-Evaluation 07/27/17   Authorization Type [employer] medicare   Authorization - Visit Number 17   Authorization - Number of Visits 18   PT Start Time 1120   PT Stop Time 1025   PT Time Calculation (min) 44 min   Equipment Utilized During Treatment Gait belt   Activity Tolerance Patient tolerated treatment well;No increased pain   Behavior During Therapy WFL for tasks assessed/performed      Past Medical History:  Diagnosis Date  . A-fib ([place])   . Arthritis    osteoarthritis  . Cataract   . Frequent falls   . Glaucoma   . High cholesterol   . Hypertension   . MI (myocardial infarction) ([place])   . Rheumatoid arthritis ([place])   . Thyroid disease     Past Surgical History:  Procedure Laterality Date  . CORONARY ANGIOPLASTY WITH STENT PLACEMENT    . JOINT REPLACEMENT     left knee  . REPLACEMENT TOTAL KNEE Left     There were no vitals filed for this visit.      Subjective Assessment - 08/16/17 1120    Subjective Pt states that the last time he fell was about three weeks ago.  He feels more steady when walking than before therapy.    Pertinent History DJD lumbar, HTN, CAD, HTN; fx collar bone.    How long can you stand comfortably? 5-10 minutes    How long can you walk comfortably? Pt only walks in his home.  He walks with a cane.  The threrapist has attempted to get him on a walking program but there is low compliance.    Patient Stated Goals not to fall    Currently in Pain? No/denies            [facility] PT Assessment - 08/16/17 0001      Assessment   Medical Diagnosis unsteadiness on feet; falls    Referring Provider Blanchie Serve    Onset Date/Surgical Date 03/25/17   Next MD Visit Sometime in October.    Prior Therapy none      Precautions   Precautions Fall     Restrictions   Weight Bearing Restrictions No     Balance Screen   Has the patient fallen in the past 6 months Yes   How many times? 4   Has the patient had a decrease in activity level because of a fear of falling?  No   Is the patient reluctant to leave their home because of a fear of falling?  No     Home Environment   Living Environment Private residence     Prior Function   Level of Independence Independent with community mobility with device   Leisure grandchildren sports events      Observation/Other Assessments   Focus on Therapeutic Outcomes (FOTO)  90 was 86     Single Leg Stance   Comments B 20 seconds was 5 on 06/19/2017     Sit to Stand   Comments 5x13.1  was 16.01  Strength   Right Hip Flexion 4-/5  was 3/5    Right Hip Extension 3+/5  was 2/5 at eval/ 3- on 6/26   Right Hip ABduction 3/5  was 2+   Left Hip Flexion 4/5  was 3-/5 at eval ; 3/5 on 6/25    Left Hip Extension 3+/5  was 2/5 at eval; now 3+/5   Left Hip ABduction 3+/5  was2+   Right Knee Flexion 4+/5   Right Knee Extension 5/5   Left Knee Flexion 4/5   Left Knee Extension 5/5   Right Ankle Dorsiflexion 5/5   Left Ankle Dorsiflexion 5/5     High Level Balance   High Level Balance Comments SLS 5-6 seconds B                      [facility] Adult PT Treatment/Exercise - 08/16/17 0001      Knee/Hip Exercises: Seated   Sit to Sand 10 reps     Knee/Hip Exercises: Supine   Bridges 15 reps     Knee/Hip Exercises: Sidelying   Hip ABduction 10 reps     Knee/Hip Exercises: Prone   Hip Extension 10 reps                PT Education - 08/16/17 1204    Education provided Yes   Education Details The importance of keeping active and  working on his balance at least 3-5 times a week.  Therapist spoke to pt about either beginning a walking program or joining silver sneakers at the [facility].  Two week free trial was given to the patient.    Person(s) Educated Patient   Methods Explanation   Comprehension Verbalized understanding          PT Short Term Goals - 08/16/17 1143      PT SHORT TERM GOAL #1   Title Pt to be able to single leg stance on both LE for at least 10 seconds to reduce risk of falling    Baseline 7/20- 6 seconds best    Time 2   Period Weeks   Status Achieved     PT SHORT TERM GOAL #2   Title Pt LE strength to improve by 1/2 grade to be able to walk up and down 8 steps in a reciprocal manner without difficulty    Baseline 7/20- global improvement    Time 3   Period Weeks   Status Achieved     PT SHORT TERM GOAL #3   Title Pt to have had no falls in the past week    Baseline 7/20- fall on sunday    Time 3   Period Weeks   Status Achieved           PT Long Term Goals - 08/16/17 1143      PT LONG TERM GOAL #1   Title Pt to be able to single leg balance on both legs for 15 seconds or greater to feel confident walking on uneven terrain.    Baseline 7/20- 6 second best ; 7/25 Rt 11 seconds LT 20    Time 4   Period Weeks   Status Achieved     PT LONG TERM GOAL #2   Title Pt strength of LE to be improved by 1 grade to allow pt to be able to get in and out of a low car without difficulty    Baseline strength has improved but car mat slips when he goes to  get out of his car.  Therapist suggested using Dysen to keep mat from slipping.    Time 4   Period Weeks   Status Partially Met     PT LONG TERM GOAL #3   Title Pt to have no falls for the past 3 weeks    Baseline fall at MD approximately 3 weeks ago    Time 4   Period Weeks   Status On-going     PT LONG TERM GOAL #4   Title Pt to be able to go up and down his steps with groceries in his hands in a reciprocal manner    Time 4    Period Weeks   Status Achieved               Plan - 08/20/17 1209    Clinical Impression Statement Pt reassessed.   He is still continuing to make gains but is not as strong as he should be but pt desires to stop therapy at this time and attempt to continue to work on his deficits on his own.  Therapist is agreeable to this and let pt know that if any time he felt like he needed to come back he could speak with his MD about t   Rehab Potential Good   PT Frequency 2x / week   PT Duration --  1 more session   PT Treatment/Interventions ADLs/Self Care Home Management;Neuromuscular re-education;Gait training;Stair training;Functional mobility training;Therapeutic activities;Therapeutic exercise;Balance training   PT Next Visit Plan Discharge pt    Consulted and Agree with Plan of Care Patient      Patient will benefit from skilled therapeutic intervention in order to improve the following deficits and impairments:  Abnormal gait, Decreased activity tolerance, Decreased balance, Pain, Decreased strength  Visit Diagnosis: Muscle weakness (generalized)  Unsteadiness on feet  Repeated falls       G-Codes - 08-20-2017 1210    Functional Limitation Changing and maintaining body position   Changing and Maintaining Body Position Current Status (Z6109) At least 1 percent but less than 20 percent impaired, limited or restricted   Changing and Maintaining Body Position Goal Status (U0454) At least 1 percent but less than 20 percent impaired, limited or restricted      Problem List Patient Active Problem List   Diagnosis Date Noted  . Essential hypertension 04/30/2017  . Hypothyroid 04/30/2017  . HLD (hyperlipidemia) 04/30/2017  . CAD in native artery 04/30/2017  . 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 Pedal edema 04/30/2017  . Glaucoma 04/30/2017  . Abdominal aortic atherosclerosis ([place]) 04/30/2017  . 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  Asymptomatic gallstones 04/30/2017  . Benign prostatic hyperplasia 04/30/2017  . Diverticulosis 04/30/2017  . Esophagitis 04/30/2017  . Angular cheilitis 04/30/2017  . Mild memory disturbance 04/30/2017  . A-fib ([place]) 04/11/2017  . Rheumatoid arthritis [facility]) 04/11/2017     [name], PT CLT [phone] Aug 20, 2017, 12:11 PM  [place], [place], [address] Phone: [phone]   Fax:  [phone]  Name: [name] MRN: [id number] Date of Birth: 06/07/1941  PHYSICAL THERAPY DISCHARGE SUMMARY  Visits from Start of Care: 17  Current functional level related to goals / functional outcomes: See above Remaining deficits: See above   Education / Equipment: HEP  Plan: Patient agrees to discharge.  Patient goals were met. Patient is being discharged due to meeting the stated rehab goals.  ?????  [name], [place] CLT [phone]

## 2017-08-16 NOTE — Patient Instructions (Signed)
Medication Instructions:  Your physician has recommended you make the following change in your medication:  Decrease Lisinopril to 2.5 mg Daily  Take Lasix 40 mg Daily As Needed for Swelling    Labwork: NONE  Testing/Procedures: NONE  Follow-Up: Your physician recommends that you schedule a follow-up appointment in: 1 Month    Any Other Special Instructions Will Be Listed Below (If Applicable). Your physician has requested that you regularly monitor and record your blood pressure readings at home. Please use the same machine at the same time of day to check your readings and record them to bring to your follow-up visit.      If you need a refill on your cardiac medications before your next appointment, please call your pharmacy.  Thank you for choosing [facility]!

## 2017-08-16 NOTE — Progress Notes (Signed)
aCardiology Office Note   Date:  08/16/2017   ID:  [name], DOB 16-Aug-1941, MRN [id number]  PCP:  Raylene Everts, MD  Cardiologist:  Bronson Ing  Chief Complaint  Patient presents with  . Atrial Fibrillation  . Coronary Artery Disease  . Hypertension      History of Present Illness: [name] is a 76 y.o. male who presents for ongoing assessment and management of chronic atrial fibrillation, CAD with history of angioplasty and stenting to unknown artery intra-also was [facility], history of hypertension, near syncope, hyperlipidemia, with multiple medical problems. He has chronic lower extremity edema. He was started on Lasix and potassium.  He was last seen in the office by Melina Copa, PA on 08/01/2017. He apparently fell coming into the office on last office visit the patient had not had any symptoms of dizziness blurred vision there was no head injury. Please see [name] lengthy office note for more detail.  On that office visit his beta blocker was discontinued, blood pressure was soft at 118/64 with a heart rate of 54 on that visit; he was to continue home physical therapy, he is here for follow-up for evaluation of response to medication changes.  He comes today without complaint. He is very hard of hearing. He is uncertain which medications he takes. His daughter gives them to him each day.    Past Medical History:  Diagnosis Date  . A-fib ([place])   . Arthritis    osteoarthritis  . Cataract   . Frequent falls   . Glaucoma   . High cholesterol   . Hypertension   . MI (myocardial infarction) ([place])   . Rheumatoid arthritis ([place])   . Thyroid disease     Past Surgical History:  Procedure Laterality Date  . CORONARY ANGIOPLASTY WITH STENT PLACEMENT    . JOINT REPLACEMENT     left knee  . REPLACEMENT TOTAL KNEE Left      Current Outpatient Prescriptions  Medication Sig Dispense Refill  . acetaminophen (TYLENOL) 650 MG CR tablet Take 650 mg  by mouth every 8 (eight) hours as needed for pain.    . folic acid (FOLVITE) 1 MG tablet Take 1 tablet (1 mg total) by mouth daily. 90 tablet 3  . inFLIXimab (REMICADE) 100 MG injection Inject into the vein every 8 (eight) weeks.     Marland Kitchen levothyroxine (SYNTHROID, LEVOTHROID) 75 MCG tablet Take 1 tablet (75 mcg total) by mouth daily before breakfast. 90 tablet 3  . methotrexate 2.5 MG tablet Take 15 mg by mouth every Sunday. 6 tablets on Sunday    . neomycin-polymyxin-hydrocortisone (CORTISPORIN) OTIC solution Place 3 drops into both ears 4 (four) times daily. 10 mL 0  . polycarbophil (FIBERCON) 625 MG tablet Take 625 mg by mouth at bedtime.    . potassium chloride SA (K-DUR,KLOR-CON) 20 MEQ tablet Tale Potassium 20 meq daily for 5 days and then daily as NEEDED for leg swelling (Patient taking differently: Take 20 mEq by mouth daily as needed. as NEEDED for leg swelling) 90 tablet 3  . rivaroxaban (XARELTO) 20 MG TABS tablet Take 1 tablet (20 mg total) by mouth daily with supper. (Patient taking differently: Take 20 mg by mouth every morning. ) 90 tablet 3  . simvastatin (ZOCOR) 40 MG tablet Take 1 tablet (40 mg total) by mouth daily. 90 tablet 3  . timolol (TIMOPTIC) 0.5 % ophthalmic solution INSTILL ONE DROP INTO EACH EYE TWICE DAILY    . furosemide (LASIX) 40  MG tablet Take 1 tablet (40 mg total) by mouth daily as needed. 30 tablet 6  . lisinopril (PRINIVIL,ZESTRIL) 2.5 MG tablet Take 1 tablet (2.5 mg total) by mouth daily. 90 tablet 3   No current facility-administered medications for this visit.     Allergies:   Patient has no known allergies.    Social History:  The patient  reports that he quit smoking about 41 years ago. His smoking use included Cigarettes. He quit after 15.00 years of use. He has never used smokeless tobacco. He reports that he does not drink alcohol or use drugs.   Family History:  The patient's family history includes Alzheimer's disease (age of onset: 42) in his  mother; Early death in his father and sister; Heart attack (age of onset: 85) in his father; Heart disease in his brother; Hyperlipidemia in his brother.    ROS: All other systems are reviewed and negative. Unless otherwise mentioned in H&P    PHYSICAL EXAM: VS:  BP 110/62   Pulse 72   Ht 5\' 9"  (1.753 m)   Wt 181 lb (82.1 kg)   SpO2 96%   BMI 26.73 kg/m  , BMI Body mass index is 26.73 kg/m. GEN: Well nourished, well developed, in no acute distress  HEENT: normal  Neck: no JVD, carotid bruits, or masses Cardiac: RRR; no murmurs, rubs, or gallops,n1+ pitting edema  Respiratory:  clear to auscultation bilaterally, normal work of breathing GI: soft, nontender, nondistended, + BS MS: no deformity or atrophy  Skin: warm and dry, no rash Neuro:  Strength and sensation are intact.Very HOH.  Psych: euthymic mood, full affect   Recent Labs: 04/30/2017: ALT 10 08/02/2017: BUN 10; Creat 0.82; Hemoglobin 12.1; Platelets 200; Potassium 4.2; Sodium 139; TSH 2.30    Lipid Panel    Component Value Date/Time   CHOL 94 04/30/2017 1623   TRIG 73 04/30/2017 1623   HDL 31 (L) 04/30/2017 1623   CHOLHDL 3.0 04/30/2017 1623   VLDL 15 04/30/2017 1623   LDLCALC 48 04/30/2017 1623      Wt Readings from Last 3 Encounters:  08/16/17 181 lb (82.1 kg)  08/06/17 185 lb 1.9 oz (84 kg)  08/01/17 185 lb (83.9 kg)      Other studies Reviewed:   ASSESSMENT AND PLAN:  1.  Coronary artery disease: He denies any recurrent chest pain, palpitations, or dyspnea. He does not feel any different with changes in medications from last office visit. He is uncertain what medications he is taking, his daughter provides them to him daily. He is uncertain if she is aware that there were medication changes on last office visit. She does not accompany him today. I'm concerned with him driving with hypotension. I would like for his daughter to accompany him on next office visit.  2. Hypotension: Blood pressure remains  soft on this office visit. Uncertain if he has stopped taking metoprolol. Heart rate is normal and not bradycardic on this office visit. Blood pressure is soft at 110/62. He remains on lisinopril 5 mg daily. I'm going to decrease it to 2.5 mg daily. I have given him a blood pressure recording sheet. He is to take his blood pressure daily and record it. He will bring it back with him when he has his follow-up appointment in one month. I have written parameters on the bottom of the blood pressure recording sheet for him to call us. I have also written to use Lasix as needed.  3. Atrial fibrillation:  Heart rate is controlled. He is not on anticoagulation therapy is C is a high fall risk having following several times most recently prior to last office visit.    Current medicines are reviewed at length with the patient today.    Labs/ tests ordered today include:   Phill Myron. West Pugh, ANP, AACC   08/16/2017 4:42 PM    [facility] [address] [place], [place] [address] Phone: [phone]; Fax: [phone]

## 2017-08-20 DIAGNOSIS — L570 Actinic keratosis: Secondary | ICD-10-CM | POA: Insufficient documentation

## 2017-08-21 ENCOUNTER — Other Ambulatory Visit (INDEPENDENT_AMBULATORY_CARE_PROVIDER_SITE_OTHER): Payer: Self-pay | Admitting: Otolaryngology

## 2017-08-21 DIAGNOSIS — H9190 Unspecified hearing loss, unspecified ear: Secondary | ICD-10-CM

## 2017-08-29 DIAGNOSIS — C4491 Basal cell carcinoma of skin, unspecified: Secondary | ICD-10-CM

## 2017-08-29 HISTORY — DX: Basal cell carcinoma of skin, unspecified: C44.91

## 2017-09-05 ENCOUNTER — Ambulatory Visit (HOSPITAL_COMMUNITY)
Admission: RE | Admit: 2017-09-05 | Discharge: 2017-09-05 | Disposition: A | Discharge status: Home / Self Care | Payer: Medicare PPO | Source: Ambulatory Visit | Attending: Otolaryngology | Admitting: Otolaryngology

## 2017-09-05 DIAGNOSIS — J320 Chronic maxillary sinusitis: Secondary | ICD-10-CM | POA: Insufficient documentation

## 2017-09-05 DIAGNOSIS — H9190 Unspecified hearing loss, unspecified ear: Secondary | ICD-10-CM

## 2017-09-05 DIAGNOSIS — H9041 Sensorineural hearing loss, unilateral, right ear, with unrestricted hearing on the contralateral side: Secondary | ICD-10-CM | POA: Diagnosis present

## 2017-09-05 DIAGNOSIS — H748X3 Other specified disorders of middle ear and mastoid, bilateral: Secondary | ICD-10-CM | POA: Insufficient documentation

## 2017-09-10 ENCOUNTER — Ambulatory Visit: Payer: Medicare PPO

## 2017-09-10 ENCOUNTER — Ambulatory Visit (INDEPENDENT_AMBULATORY_CARE_PROVIDER_SITE_OTHER): Payer: Medicare PPO | Admitting: Podiatry

## 2017-09-10 DIAGNOSIS — L84 Corns and callosities: Secondary | ICD-10-CM | POA: Diagnosis not present

## 2017-09-12 NOTE — Progress Notes (Signed)
   Subjective: Patient presents to the office today for chief complaint of a painful callus lesion of the right forefoot that appeared about 6 months ago. Patient states that the pain is affecting their ability to ambulate without pain. Patient presents today for further treatment and evaluation.  Objective:  Physical Exam General: Alert and oriented x3 in no acute distress  Dermatology: Hyperkeratotic lesion present on the right forefoot. Pain on palpation with a central nucleated core noted.  Skin is warm, dry and supple bilateral lower extremities. Negative for open lesions or macerations.  Vascular: Palpable pedal pulses bilaterally. No edema or erythema noted. Capillary refill within normal limits.  Neurological: Epicritic and protective threshold grossly intact bilaterally.   Musculoskeletal Exam: Pain on palpation at the keratotic lesion noted. Range of motion within normal limits bilateral. Muscle strength 5/5 in all groups bilateral.  Assessment: #1 Pre-ulcerative callus right   Plan of Care:  #1 Patient evaluated. #2 Excisional debridement of keratoic lesion using a chisel blade was performed without incident.  #3 Dressed area with light dressing. #4 Patient is to return to the clinic PRN.   Edrick Kins, DPM [facility]  Dr. Edrick Kins, [place]                                        [place], [place] [address]                Office [phone]  Fax [phone]

## 2017-09-19 ENCOUNTER — Ambulatory Visit: Payer: Medicare PPO | Admitting: Family Medicine

## 2017-09-20 ENCOUNTER — Ambulatory Visit (INDEPENDENT_AMBULATORY_CARE_PROVIDER_SITE_OTHER): Payer: Medicare PPO | Admitting: Cardiovascular Disease

## 2017-09-20 ENCOUNTER — Encounter: Payer: Self-pay | Admitting: Cardiovascular Disease

## 2017-09-20 VITALS — BP 126/62 | HR 71 | Ht 70.0 in | Wt 177.0 lb

## 2017-09-20 DIAGNOSIS — I482 Chronic atrial fibrillation, unspecified: Secondary | ICD-10-CM

## 2017-09-20 DIAGNOSIS — R600 Localized edema: Secondary | ICD-10-CM

## 2017-09-20 DIAGNOSIS — Z7901 Long term (current) use of anticoagulants: Secondary | ICD-10-CM

## 2017-09-20 DIAGNOSIS — I2510 Atherosclerotic heart disease of native coronary artery without angina pectoris: Secondary | ICD-10-CM | POA: Diagnosis not present

## 2017-09-20 NOTE — Patient Instructions (Addendum)
Medication Instructions: NONE  Labwork: NONE  Procedures/Testing: NONE  Follow-Up: January 2019 with Dr Bronson Ing     If you need a refill on your cardiac medications before your next appointment, please call your pharmacy.       Thank you for choosing [facility] !

## 2017-09-20 NOTE — Addendum Note (Signed)
Addended by: Barbarann Ehlers A on: 09/20/2017 10:24 AM   Modules accepted: Orders

## 2017-09-20 NOTE — Progress Notes (Signed)
SUBJECTIVE: The patient presents for follow-up of coronary artery disease.  He has a history of coronary artery disease with remote angioplasty and stenting in 2002 in [place], [place]. He also has a history of atrial fibrillation and near-syncope. He is anticoagulated with Xarelto.   Echocardiogram performed on 05/04/16 showed normal left ventricular systolic function and regional wall motion, LVEF 65-70%. There was moderate left atrial dilatation and mild tricuspid regurgitation.  He had bradycardia and falls on 08/01/17 and beta blocker was stopped.  There was some mention of anticoagulation being stopped due to being a high falls risk.  He has not had any further falls. He denies chest pain, shortness of breath, dizziness, and leg swelling. He remains on Xarelto.  He is feeling well. He told me he has never been on infliximab.   Soc Hx: He moved to [place] from [place] in November 2017. His daughter Magda Paganini) is a local high school math teacher and her husband Leroy Sea) is the new head football coach at [employer]. His father was a Education officer, environmental in [place].   Review of Systems: As per "subjective", otherwise negative.  No Known Allergies  Current Outpatient Prescriptions  Medication Sig Dispense Refill  . acetaminophen (TYLENOL) 650 MG CR tablet Take 650 mg by mouth every 8 (eight) hours as needed for pain.    . folic acid (FOLVITE) 1 MG tablet Take 1 tablet (1 mg total) by mouth daily. 90 tablet 3  . furosemide (LASIX) 40 MG tablet Take 1 tablet (40 mg total) by mouth daily as needed. 30 tablet 6  . inFLIXimab (REMICADE) 100 MG injection Inject into the vein every 8 (eight) weeks.     Marland Kitchen levothyroxine (SYNTHROID, LEVOTHROID) 75 MCG tablet Take 1 tablet (75 mcg total) by mouth daily before breakfast. 90 tablet 3  . lisinopril (PRINIVIL,ZESTRIL) 2.5 MG tablet Take 1 tablet (2.5 mg total) by mouth daily. 90 tablet 3  . methotrexate 2.5 MG tablet Take 15 mg by  mouth every Sunday. 6 tablets on Sunday    . neomycin-polymyxin-hydrocortisone (CORTISPORIN) OTIC solution Place 3 drops into both ears 4 (four) times daily. 10 mL 0  . polycarbophil (FIBERCON) 625 MG tablet Take 625 mg by mouth at bedtime.    . potassium chloride SA (K-DUR,KLOR-CON) 20 MEQ tablet Tale Potassium 20 meq daily for 5 days and then daily as NEEDED for leg swelling (Patient taking differently: Take 20 mEq by mouth daily as needed. as NEEDED for leg swelling) 90 tablet 3  . rivaroxaban (XARELTO) 20 MG TABS tablet Take 1 tablet (20 mg total) by mouth daily with supper. (Patient taking differently: Take 20 mg by mouth every morning. ) 90 tablet 3  . simvastatin (ZOCOR) 40 MG tablet Take 1 tablet (40 mg total) by mouth daily. 90 tablet 3  . timolol (TIMOPTIC) 0.5 % ophthalmic solution INSTILL ONE DROP INTO EACH EYE TWICE DAILY     No current facility-administered medications for this visit.     Past Medical History:  Diagnosis Date  . A-fib ([place])   . Arthritis    osteoarthritis  . Cataract   . Frequent falls   . Glaucoma   . High cholesterol   . Hypertension   . MI (myocardial infarction) ([place])   . Rheumatoid arthritis ([place])   . Thyroid disease     Past Surgical History:  Procedure Laterality Date  . CORONARY ANGIOPLASTY WITH STENT PLACEMENT    . JOINT REPLACEMENT  left knee  . REPLACEMENT TOTAL KNEE Left     Social History   Social History  . Marital status: Widowed    Spouse name: N/A  . Number of children: 1  . Years of education: 57   Occupational History  . retired     Psychologist, counselling and first aid equip   Social History Main Topics  . Smoking status: Former Smoker    Years: 15.00    Types: Cigarettes    Quit date: 12/26/1975  . Smokeless tobacco: Never Used  . Alcohol use No  . Drug use: No  . Sexual activity: Not Currently   Other Topics Concern  . Not on file   Social History Narrative   College graduate    Widow   Lives alone   Lives near  [name]/daughter     Vitals:   09/20/17 0955  BP: 126/62  Pulse: 71  SpO2: 98%  Weight: 177 lb (80.3 kg)  Height: 5\' 10"  (1.778 m)    Wt Readings from Last 3 Encounters:  09/20/17 177 lb (80.3 kg)  08/16/17 181 lb (82.1 kg)  08/06/17 185 lb 1.9 oz (84 kg)     PHYSICAL EXAM General: NAD HEENT: Normal. Neck: No JVD, no thyromegaly. Lungs: Clear to auscultation bilaterally with normal respiratory effort. CV: Nondisplaced PMI.  Regular rate and irregular rhythm, normal S1/S2, no S3, no murmur. Trivial pretibial  edema.  No carotid bruit.  Bilateral venous varicosities. Abdomen: Soft, nontender, no distention.  Neurologic: Alert and oriented.  Psych: Normal affect. Skin: Normal. Musculoskeletal: No gross deformities.    ECG: Most recent ECG reviewed.   Labs: Lab Results  Component Value Date/Time   K 4.2 08/02/2017 10:31 AM   BUN 10 08/02/2017 10:31 AM   CREATININE 0.82 08/02/2017 10:31 AM   ALT 10 04/30/2017 04:23 PM   TSH 2.30 08/02/2017 10:31 AM   HGB 12.1 (L) 08/02/2017 10:31 AM     Lipids: Lab Results  Component Value Date/Time   LDLCALC 48 04/30/2017 04:23 PM   CHOL 94 04/30/2017 04:23 PM   TRIG 73 04/30/2017 04:23 PM   HDL 31 (L) 04/30/2017 04:23 PM       ASSESSMENT AND PLAN: 1. CAD with history of PCI: Symptomatically stable. Continue simvastatin. Not on aspirin as he is on Xarelto. No longer on beta blockers due to bradycardia and falls.  2. Chronic atrial fibrillation: Symptomatically stable with good heart rate control. No longer on beta blockers. Anticoagulated with Xarelto. I would not stop anticoagulation. He is no longer falling.  3. Bilateral leg edema: Stable. Likely dependent edema due to venous varicosities. Continue Lasix prn.      Disposition: Follow up January 2019   Kate Sable, M.D., F.A.C.C.

## 2017-09-21 ENCOUNTER — Ambulatory Visit (INDEPENDENT_AMBULATORY_CARE_PROVIDER_SITE_OTHER): Payer: Medicare PPO | Admitting: Family Medicine

## 2017-09-21 ENCOUNTER — Telehealth: Payer: Self-pay

## 2017-09-21 ENCOUNTER — Encounter: Payer: Self-pay | Admitting: Family Medicine

## 2017-09-21 VITALS — BP 120/70 | HR 76 | Temp 98.9°F | Resp 16 | Ht 70.0 in | Wt 176.0 lb

## 2017-09-21 DIAGNOSIS — H9193 Unspecified hearing loss, bilateral: Secondary | ICD-10-CM | POA: Diagnosis not present

## 2017-09-21 DIAGNOSIS — R2681 Unsteadiness on feet: Secondary | ICD-10-CM

## 2017-09-21 DIAGNOSIS — Z23 Encounter for immunization: Secondary | ICD-10-CM | POA: Diagnosis not present

## 2017-09-21 NOTE — Progress Notes (Signed)
Chief Complaint  Patient presents with  . Other    DMV Form   Patient had a valid, not expired drivers license.  He lost his wallet and went to get replacement.  They saw his gait, and sent him home with a form for a MD to fill in stating he is healthy to drive. He has had no accidents He drives all the time, up to 4 hours away He has no memory or cognitive impairment He has use of both arms and legs He has good corrected vision and saw his eye doctor He is hard of hearing, but is picking up his hearing aids next week He has a heart condition, is compliant with medicines and doctors care. He does not have any medical condition that would cause sudden control of a vehicle. He does not know why he was singled out to have a medical clearance.  Patient Active Problem List   Diagnosis Date Noted  . Essential hypertension 04/30/2017  . Hypothyroid 04/30/2017  . HLD (hyperlipidemia) 04/30/2017  . CAD in native artery 04/30/2017  . 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 Pedal edema 04/30/2017  . Glaucoma 04/30/2017  . Abdominal aortic atherosclerosis ([place]) 04/30/2017  . 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 Asymptomatic gallstones 04/30/2017  . Benign prostatic hyperplasia 04/30/2017  . Diverticulosis 04/30/2017  . Esophagitis 04/30/2017  . Angular cheilitis 04/30/2017  . A-fib ([place]) 04/11/2017  . Rheumatoid arthritis ([place]) 04/11/2017    Outpatient Encounter Prescriptions as of 09/21/2017  Medication Sig  . acetaminophen (TYLENOL) 650 MG CR tablet Take 650 mg by mouth every 8 (eight) hours as needed for pain.  . folic acid (FOLVITE) 1 MG tablet Take 1 tablet (1 mg total) by mouth daily.  . furosemide (LASIX) 40 MG tablet Take 1 tablet (40 mg total) by mouth daily as needed.  Marland Kitchen levothyroxine (SYNTHROID, LEVOTHROID) 75 MCG tablet Take 1 tablet (75 mcg total) by mouth daily before breakfast.  . lisinopril (PRINIVIL,ZESTRIL) 2.5 MG tablet Take 1 tablet  (2.5 mg total) by mouth daily.  . methotrexate 2.5 MG tablet Take 15 mg by mouth every Sunday. 6 tablets on Sunday  . polycarbophil (FIBERCON) 625 MG tablet Take 625 mg by mouth at bedtime.  . potassium chloride SA (K-DUR,KLOR-CON) 20 MEQ tablet Tale Potassium 20 meq daily for 5 days and then daily as NEEDED for leg swelling (Patient taking differently: Take 20 mEq by mouth daily as needed. as NEEDED for leg swelling)  . rivaroxaban (XARELTO) 20 MG TABS tablet Take 1 tablet (20 mg total) by mouth daily with supper. (Patient taking differently: Take 20 mg by mouth every morning. )  . simvastatin (ZOCOR) 40 MG tablet Take 1 tablet (40 mg total) by mouth daily.  . timolol (TIMOPTIC) 0.5 % ophthalmic solution INSTILL ONE DROP INTO EACH EYE TWICE DAILY   No facility-administered encounter medications on file as of 09/21/2017.     No Known Allergies  Review of Systems  Constitutional: Negative for activity change, appetite change and fatigue.  HENT: Positive for hearing loss. Negative for congestion and dental problem.   Eyes: Negative for photophobia and visual disturbance.  Respiratory: Negative for cough and shortness of breath.   Cardiovascular: Negative for chest pain, palpitations and leg swelling.  Musculoskeletal: Positive for arthralgias. Negative for back pain.  Neurological: Negative for dizziness, seizures, syncope and light-headedness.  Psychiatric/Behavioral: Negative for confusion, decreased concentration, dysphoric mood and sleep disturbance.    BP 120/70 (  BP Location: Left Arm, Patient Position: Sitting, Cuff Size: Normal)   Pulse 76   Temp 98.9 F (37.2 C) (Other (Comment))   Resp 16   Ht 5\' 10"  (1.778 m)   Wt 176 lb (79.8 kg)   SpO2 97%   BMI 25.25 kg/m   Physical Exam  Constitutional: He is oriented to person, place, and time. He appears well-developed and well-nourished. No distress.  Pleasant elderly gentleman.  Uses cane.  Small steps  HENT:  Head: Normocephalic  and atraumatic.  Mouth/Throat: Oropharynx is clear and moist.  Angular chelitis Many missing teeth  Eyes: Pupils are equal, round, and reactive to light. Conjunctivae are normal.  Neck: Normal range of motion.  Cardiovascular: Normal rate and normal heart sounds.  An irregularly irregular rhythm present.  Pulmonary/Chest: Effort normal and breath sounds normal. He has no rales.  Abdominal: Soft. Bowel sounds are normal.  Musculoskeletal:  Arthritic changes of hands.  Well healed arthroplasty scar R knee.   Lymphadenopathy:    He has no cervical adenopathy.  Neurological: He is alert and oriented to person, place, and time.  Decreased general strength - improved on PT  Psychiatric: He has a normal mood and affect. His behavior is normal.  Hesitant speech.  Scores 30/30 on MMSE    ASSESSMENT/PLAN:  1. Bilateral hearing loss, unspecified hearing loss type Aids pending  2. Gait instability Is in PT, no falls  3. Need for immunization against influenza done - Flu Vaccine QUAD 36+ mos IM  Greater than 50% of this visit was spent in counseling and coordinating care.  Total face to face time:   25 min in doing history and PE to establish driving safety. Completing DMV forms   Patient Instructions  See me in a couple of months   Raylene Everts, MD

## 2017-09-21 NOTE — Telephone Encounter (Signed)
Pt walked in office to advise that he was mistaken when he said he was not taking remicade, but he is. He asked that we add it back on his medication list.

## 2017-09-21 NOTE — Patient Instructions (Signed)
See me in a couple of months

## 2017-10-08 ENCOUNTER — Ambulatory Visit (INDEPENDENT_AMBULATORY_CARE_PROVIDER_SITE_OTHER): Payer: Medicare PPO | Admitting: Otolaryngology

## 2017-10-08 DIAGNOSIS — H6523 Chronic serous otitis media, bilateral: Secondary | ICD-10-CM | POA: Diagnosis not present

## 2017-10-08 DIAGNOSIS — H9011 Conductive hearing loss, unilateral, right ear, with unrestricted hearing on the contralateral side: Secondary | ICD-10-CM

## 2017-10-08 DIAGNOSIS — J330 Polyp of nasal cavity: Secondary | ICD-10-CM | POA: Diagnosis not present

## 2017-10-08 DIAGNOSIS — J320 Chronic maxillary sinusitis: Secondary | ICD-10-CM

## 2017-10-08 DIAGNOSIS — J310 Chronic rhinitis: Secondary | ICD-10-CM

## 2017-10-10 ENCOUNTER — Ambulatory Visit: Payer: Medicare PPO | Admitting: Family Medicine

## 2017-11-18 ENCOUNTER — Encounter (HOSPITAL_COMMUNITY): Payer: Self-pay | Admitting: Emergency Medicine

## 2017-11-18 ENCOUNTER — Emergency Department (HOSPITAL_COMMUNITY)
Admission: EM | Admit: 2017-11-18 | Discharge: 2017-11-19 | Disposition: A | Discharge status: Home / Self Care | Payer: Medicare PPO | Attending: Emergency Medicine | Admitting: Emergency Medicine

## 2017-11-18 ENCOUNTER — Other Ambulatory Visit: Payer: Self-pay

## 2017-11-18 DIAGNOSIS — W010XXA Fall on same level from slipping, tripping and stumbling without subsequent striking against object, initial encounter: Secondary | ICD-10-CM | POA: Insufficient documentation

## 2017-11-18 DIAGNOSIS — S60511A Abrasion of right hand, initial encounter: Secondary | ICD-10-CM | POA: Insufficient documentation

## 2017-11-18 DIAGNOSIS — I10 Essential (primary) hypertension: Secondary | ICD-10-CM | POA: Insufficient documentation

## 2017-11-18 DIAGNOSIS — I2510 Atherosclerotic heart disease of native coronary artery without angina pectoris: Secondary | ICD-10-CM | POA: Insufficient documentation

## 2017-11-18 DIAGNOSIS — Z87891 Personal history of nicotine dependence: Secondary | ICD-10-CM | POA: Insufficient documentation

## 2017-11-18 DIAGNOSIS — S7012XA Contusion of left thigh, initial encounter: Secondary | ICD-10-CM | POA: Insufficient documentation

## 2017-11-18 DIAGNOSIS — Y939 Activity, unspecified: Secondary | ICD-10-CM | POA: Diagnosis not present

## 2017-11-18 DIAGNOSIS — Z79899 Other long term (current) drug therapy: Secondary | ICD-10-CM | POA: Insufficient documentation

## 2017-11-18 DIAGNOSIS — S7001XA Contusion of right hip, initial encounter: Secondary | ICD-10-CM

## 2017-11-18 DIAGNOSIS — Y999 Unspecified external cause status: Secondary | ICD-10-CM | POA: Diagnosis not present

## 2017-11-18 DIAGNOSIS — Y929 Unspecified place or not applicable: Secondary | ICD-10-CM | POA: Insufficient documentation

## 2017-11-18 DIAGNOSIS — S79911A Unspecified injury of right hip, initial encounter: Secondary | ICD-10-CM | POA: Diagnosis present

## 2017-11-18 DIAGNOSIS — W19XXXA Unspecified fall, initial encounter: Secondary | ICD-10-CM

## 2017-11-18 DIAGNOSIS — S60519A Abrasion of unspecified hand, initial encounter: Secondary | ICD-10-CM

## 2017-11-18 NOTE — ED Triage Notes (Signed)
Slipped on some wet leaves landed on lt leg.  Reports swelling and pain in upper lt leg

## 2017-11-19 ENCOUNTER — Emergency Department (HOSPITAL_COMMUNITY): Payer: Medicare PPO

## 2017-11-19 NOTE — ED Provider Notes (Signed)
[facility] EMERGENCY DEPARTMENT Provider Note   CSN: [id number] Arrival date & time: 11/18/17  2222     History   Chief Complaint Chief Complaint  Patient presents with  . Fall    HPI [name] is a 76 y.o. male.  Patient is a 76 year old male with past medical history of atrial fibrillation, coronary artery disease, hypertension presenting for evaluation of fall.  He was walking up a sidewalk when he slipped on wet leaves.  He caused an abrasion to his left hand.  He is having pain in his right hip and left thigh.   The history is provided by the patient.  Fall  This is a new problem. The current episode started 1 to 2 hours ago. The problem occurs constantly. The problem has not changed since onset.Exacerbated by: Movement and palpation. Nothing relieves the symptoms. He has tried nothing for the symptoms.    Past Medical History:  Diagnosis Date  . A-fib ([place])   . Arthritis    osteoarthritis  . Cataract   . Frequent falls   . Glaucoma   . High cholesterol   . Hypertension   . MI (myocardial infarction) ([place])   . Rheumatoid arthritis ([place])   . Thyroid disease     Patient Active Problem List   Diagnosis Date Noted  . Essential hypertension 04/30/2017  . Hypothyroid 04/30/2017  . HLD (hyperlipidemia) 04/30/2017  . CAD in native artery 04/30/2017  . 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 Pedal edema 04/30/2017  . Glaucoma 04/30/2017  . Abdominal aortic atherosclerosis ([place]) 04/30/2017  . 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 Asymptomatic gallstones 04/30/2017  . Benign prostatic hyperplasia 04/30/2017  . Diverticulosis 04/30/2017  . Esophagitis 04/30/2017  . Angular cheilitis 04/30/2017  . A-fib ([place]) 04/11/2017  . Rheumatoid arthritis ([place]) 04/11/2017    Past Surgical History:  Procedure Laterality Date  . CORONARY ANGIOPLASTY WITH STENT PLACEMENT    . JOINT REPLACEMENT     left knee  . REPLACEMENT TOTAL KNEE  Left        Home Medications    Prior to Admission medications   Medication Sig Start Date End Date Taking? Authorizing Provider  acetaminophen (TYLENOL) 650 MG CR tablet Take 650 mg by mouth every 8 (eight) hours as needed for pain.    [provider]  folic acid (FOLVITE) 1 MG tablet Take 1 tablet (1 mg total) by mouth daily. 04/30/17   Raylene Everts, MD  furosemide (LASIX) 40 MG tablet Take 1 tablet (40 mg total) by mouth daily as needed. 08/16/17 11/14/17  Lendon Colonel, NP  levothyroxine (SYNTHROID, LEVOTHROID) 75 MCG tablet Take 1 tablet (75 mcg total) by mouth daily before breakfast. 04/30/17   Raylene Everts, MD  lisinopril (PRINIVIL,ZESTRIL) 2.5 MG tablet Take 1 tablet (2.5 mg total) by mouth daily. 08/16/17 11/14/17  Lendon Colonel, NP  methotrexate 2.5 MG tablet Take 15 mg by mouth every Sunday. 6 tablets on Sunday    [provider]  neomycin-polymyxin-hydrocortisone (CORTISPORIN) OTIC solution Place 3 drops into both ears 4 (four) times daily. 08/06/17   Raylene Everts, MD  polycarbophil (FIBERCON) 625 MG tablet Take 625 mg by mouth at bedtime.    [provider]  potassium chloride SA (K-DUR,KLOR-CON) 20 MEQ tablet Tale Potassium 20 meq daily for 5 days and then daily as NEEDED for leg swelling Patient taking differently: Take 20 mEq by mouth daily as needed. as NEEDED for  leg swelling 04/16/17   Herminio Commons, MD  rivaroxaban (XARELTO) 20 MG TABS tablet Take 1 tablet (20 mg total) by mouth daily with supper. Patient taking differently: Take 20 mg by mouth every morning.  04/16/17   Herminio Commons, MD  simvastatin (ZOCOR) 40 MG tablet Take 1 tablet (40 mg total) by mouth daily. 04/30/17   Raylene Everts, MD  timolol (TIMOPTIC) 0.5 % ophthalmic solution INSTILL ONE DROP INTO EACH EYE TWICE DAILY 12/15/13   [provider]    Family History Family History  Problem Relation Age of Onset  . Heart attack Father 25   . Early death Father   . Alzheimer's disease Mother 25  . Early death Sister        MVA  . Hyperlipidemia Brother   . Heart disease Brother     Social History Social History   Tobacco Use  . Smoking status: Former Smoker    Years: 15.00    Types: Cigarettes    Last attempt to quit: 12/26/1975    Years since quitting: 41.9  . Smokeless tobacco: Never Used  Substance Use Topics  . Alcohol use: No  . Drug use: No     Allergies   Patient has no known allergies.   Review of Systems Review of Systems  All other systems reviewed and are negative.    Physical Exam Updated Vital Signs BP (!) 99/59 (BP Location: Right Arm)   Pulse (!) 44   Temp 98.4 F (36.9 C) (Oral)   Resp 18   Ht 5\' 11"  (1.803 m)   Wt 77.1 kg (170 lb)   SpO2 96%   BMI 23.71 kg/m   Physical Exam  Constitutional: He is oriented to person, place, and time. He appears well-developed and well-nourished. No distress.  HENT:  Head: Normocephalic and atraumatic.  Mouth/Throat: Oropharynx is clear and moist.  Neck: Normal range of motion. Neck supple.  Cardiovascular: Normal rate and regular rhythm. Exam reveals no friction rub.  No murmur heard. Pulmonary/Chest: Effort normal and breath sounds normal. No respiratory distress. He has no wheezes. He has no rales.  Abdominal: Soft. Bowel sounds are normal. He exhibits no distension. There is no tenderness.  Musculoskeletal: Normal range of motion. He exhibits no edema.  The right hip appears grossly normal.  There is tenderness to palpation over the lateral aspect.  He has good range of motion and is able to lift leg off the bed.  Distal PMS is intact.  He has tenderness to the mid left thigh.  There is no deformity and he has good range of motion.  Distal PMS is intact.  There is a small laceration/abrasion to the dorsum of the right hand.  It is approximately 1 cm in length and not in need of any sutures.  Bleeding is controlled.  He has full range of  motion of his hand with minimal if any discomfort.  Neurological: He is alert and oriented to person, place, and time. Coordination normal.  Skin: Skin is warm and dry. He is not diaphoretic.  Nursing note and vitals reviewed.    ED Treatments / Results  Labs (all labs ordered are listed, but only abnormal results are displayed) Labs Reviewed - No data to display  EKG  EKG Interpretation None       Radiology No results found.  Procedures Procedures (including critical care time)  Medications Ordered in ED Medications - No data to display   Initial Impression /  Assessment and Plan / ED Course  I have reviewed the triage vital signs and the nursing notes.  Pertinent labs & imaging results that were available during my care of the patient were reviewed by me and considered in my medical decision making (see chart for details).  X-rays negative for fracture.  We will discharged with as needed follow-up.  Final Clinical Impressions(s) / ED Diagnoses   Final diagnoses:  None    ED Discharge Orders    None       Veryl Speak, MD 11/19/17 [phone]

## 2017-11-19 NOTE — Discharge Instructions (Signed)
Local wound care with bacitracin and dressing changes twice daily.  Ibuprofen 600 mg every 8 hours as needed for pain.  Follow-up with primary doctor if not improving in the next week.

## 2017-11-23 ENCOUNTER — Ambulatory Visit: Payer: Medicare PPO | Admitting: Family Medicine

## 2017-11-27 ENCOUNTER — Encounter: Payer: Self-pay | Admitting: Family Medicine

## 2017-11-27 ENCOUNTER — Ambulatory Visit (INDEPENDENT_AMBULATORY_CARE_PROVIDER_SITE_OTHER): Payer: Medicare PPO | Admitting: Family Medicine

## 2017-11-27 ENCOUNTER — Ambulatory Visit: Payer: Medicare PPO | Admitting: Family Medicine

## 2017-11-27 ENCOUNTER — Other Ambulatory Visit: Payer: Self-pay

## 2017-11-27 VITALS — BP 130/54 | HR 52 | Temp 97.1°F | Resp 16 | Ht 70.0 in | Wt 179.0 lb

## 2017-11-27 DIAGNOSIS — R2681 Unsteadiness on feet: Secondary | ICD-10-CM

## 2017-11-27 DIAGNOSIS — Z23 Encounter for immunization: Secondary | ICD-10-CM | POA: Diagnosis not present

## 2017-11-27 DIAGNOSIS — M6281 Muscle weakness (generalized): Secondary | ICD-10-CM | POA: Diagnosis not present

## 2017-11-27 DIAGNOSIS — R296 Repeated falls: Secondary | ICD-10-CM | POA: Diagnosis not present

## 2017-11-27 NOTE — Progress Notes (Signed)
Chief Complaint  Patient presents with  . Follow-up    3 month   [name] is here for follow-up.  Since being seen last he has had another fall.  He states that he tripped and fell.  I note that in the emergency room, he had hypotension upon arrival.  This normalized over time.  He had x-rays performed with no fracture.  He insists that he tripped and fell because of balance, and slipping on wet leaves.  He is certain that he was not dizzy or lightheaded resulting in his accident. He had a full course of physical therapy.  He did see some improvement in his strength.  On my evaluation, he is not much improved.  He still cannot stand up out of a chair and walk without assistance, demonstrating muscle weakness in his legs.  I recommend that he see a neurologist in consultation for his loss of strength and balance.  If authorized, we will do another course of physical therapy.  He is under the care of Dr. Amil Amen in rheumatology.  He sees them every 6 weeks.  I am going to call Dr. Amil Amen to see if he has insight regarding his condition. Appetite is good.  Weight is stable.  No trouble with lungs or breathing.  No trouble with bowels or digestion.  No kidney or urinary complaints. For pain he is taking acetaminophen.  He generally takes 500 mg at a time.  It is not always effective.  I have advised him that he can increase this to 1000 mg at a time, up to 3 times a day as needed.  He is reminded not to take nonsteroidal anti-inflammatories on Xarelto  Patient Active Problem List   Diagnosis Date Noted  . Basal cell carcinoma (BCC) 08/29/2017  . Actinic keratosis 08/20/2017  . Essential hypertension 04/30/2017  . Hypothyroid 04/30/2017  . HLD (hyperlipidemia) 04/30/2017  . CAD in native artery 04/30/2017  . 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 Pedal edema 04/30/2017  . Glaucoma 04/30/2017  . Abdominal aortic atherosclerosis ([place]) 04/30/2017  . Degenerative joint disease (DJD) of  lumbar spine 04/30/2017  . Asymptomatic gallstones 04/30/2017  . Benign prostatic hyperplasia 04/30/2017  . Diverticulosis 04/30/2017  . Esophagitis 04/30/2017  . Angular cheilitis 04/30/2017  . A-fib ([place]) 04/11/2017  . Rheumatoid arthritis ([place]) 04/11/2017    Outpatient Encounter Medications as of 11/27/2017  Medication Sig  . acetaminophen (TYLENOL) 500 MG tablet Take 1,000 mg by mouth every 6 (six) hours as needed.  . folic acid (FOLVITE) 1 MG tablet Take 1 tablet (1 mg total) by mouth daily.  Marland Kitchen inFLIXimab (REMICADE) 100 MG injection Inject into the vein.  Marland Kitchen levothyroxine (SYNTHROID, LEVOTHROID) 75 MCG tablet Take 1 tablet (75 mcg total) by mouth daily before breakfast.  . methotrexate 2.5 MG tablet Take 15 mg by mouth every Sunday. 6 tablets on Sunday  . neomycin-polymyxin-hydrocortisone (CORTISPORIN) OTIC solution Place 3 drops into both ears 4 (four) times daily.  . polycarbophil (FIBERCON) 625 MG tablet Take 625 mg by mouth at bedtime.  . potassium chloride SA (K-DUR,KLOR-CON) 20 MEQ tablet Tale Potassium 20 meq daily for 5 days and then daily as NEEDED for leg swelling (Patient taking differently: Take 20 mEq by mouth daily as needed. as NEEDED for leg swelling)  . rivaroxaban (XARELTO) 20 MG TABS tablet Take 1 tablet (20 mg total) by mouth daily with supper. (Patient taking differently: Take 20 mg by mouth every morning. )  .  simvastatin (ZOCOR) 40 MG tablet Take 1 tablet (40 mg total) by mouth daily.  . timolol (TIMOPTIC) 0.5 % ophthalmic solution INSTILL ONE DROP INTO EACH EYE TWICE DAILY  . [DISCONTINUED] acetaminophen (TYLENOL) 650 MG CR tablet Take 650 mg by mouth every 8 (eight) hours as needed for pain.  . furosemide (LASIX) 40 MG tablet Take 1 tablet (40 mg total) by mouth daily as needed.  Marland Kitchen lisinopril (PRINIVIL,ZESTRIL) 2.5 MG tablet Take 1 tablet (2.5 mg total) by mouth daily.   No facility-administered encounter medications on file as of 11/27/2017.     No Known  Allergies  Review of Systems  Constitutional: Negative for activity change, appetite change and fatigue.  HENT: Positive for hearing loss. Negative for congestion and dental problem.   Eyes: Negative for photophobia and visual disturbance.  Respiratory: Negative for cough and shortness of breath.   Cardiovascular: Negative for chest pain, palpitations and leg swelling.  Musculoskeletal: Positive for arthralgias and gait problem. Negative for back pain.  Neurological: Positive for weakness. Negative for dizziness, seizures, syncope and light-headedness.  Psychiatric/Behavioral: Negative for confusion, decreased concentration, dysphoric mood and sleep disturbance.     BP (!) 130/54 (BP Location: Left Arm, Patient Position: Sitting, Cuff Size: Normal)   Pulse (!) 52   Temp (!) 97.1 F (36.2 C) (Temporal)   Resp 16   Ht 5\' 10"  (1.778 m)   Wt 179 lb (81.2 kg)   SpO2 100%   BMI 25.68 kg/m   Physical Exam  Constitutional: He is oriented to person, place, and time. He appears well-developed and well-nourished. No distress.  Pleasant elderly gentleman.  Uses cane.  Small steps  HENT:  Head: Normocephalic and atraumatic.  Mouth/Throat: Oropharynx is clear and moist.  Angular chelitis Many missing teeth  Eyes: Conjunctivae are normal. Pupils are equal, round, and reactive to light.  Neck: Normal range of motion.  Cardiovascular: Normal rate and normal heart sounds. An irregularly irregular rhythm present.  Pulmonary/Chest: Effort normal and breath sounds normal. He has no rales.  Abdominal: Soft. Bowel sounds are normal.  Musculoskeletal:  Arthritic changes of hands.  Well healed arthroplasty scar R knee.   Lymphadenopathy:    He has no cervical adenopathy.  Neurological: He is alert and oriented to person, place, and time.  Decreased general strength -cannot stand out of chair without assistance  Psychiatric: He has a normal mood and affect. His behavior is normal.  Hesitant  speech.     ASSESSMENT/PLAN:  1. Gait instability Not appreciably improved with physical therapy. - Ambulatory referral to Neurology  2. Falls frequently Delete - Ambulatory referral to Neurology  3. Muscle weakness-general Delete   Patient Instructions  I have referred you to neurology for a consultation. I will consult with your rheumatologist, Dr. Amil Amen I will order physical therapy once you have seen the specialist You may take acetaminophen 1000 mg at a time, up to 3 times a day Avoid ibuprofen and aspirin type of medicines because of your blood thinner See me in 2 months for follow-up, call sooner for problems   Raylene Everts, MD

## 2017-11-27 NOTE — Patient Instructions (Addendum)
I have referred you to neurology for a consultation. I will consult with your rheumatologist, Dr. Amil Amen I will order physical therapy once you have seen the specialist You may take acetaminophen 1000 mg at a time, up to 3 times a day Avoid ibuprofen and aspirin type of medicines because of your blood thinner See me in 2 months for follow-up, call sooner for problems

## 2017-12-10 ENCOUNTER — Encounter (HOSPITAL_COMMUNITY): Payer: Self-pay | Admitting: Emergency Medicine

## 2017-12-10 ENCOUNTER — Emergency Department (HOSPITAL_COMMUNITY): Payer: Medicare PPO

## 2017-12-10 ENCOUNTER — Inpatient Hospital Stay (HOSPITAL_COMMUNITY)
Admission: EM | Admit: 2017-12-10 | Discharge: 2017-12-15 | DRG: 057 | Disposition: A | Discharge status: Skilled Nursing Facility | Payer: Medicare PPO | Attending: Internal Medicine | Admitting: Internal Medicine

## 2017-12-10 ENCOUNTER — Other Ambulatory Visit: Payer: Self-pay

## 2017-12-10 ENCOUNTER — Observation Stay (HOSPITAL_COMMUNITY): Payer: Medicare PPO

## 2017-12-10 DIAGNOSIS — I2510 Atherosclerotic heart disease of native coronary artery without angina pectoris: Secondary | ICD-10-CM | POA: Diagnosis present

## 2017-12-10 DIAGNOSIS — H9190 Unspecified hearing loss, unspecified ear: Secondary | ICD-10-CM | POA: Diagnosis present

## 2017-12-10 DIAGNOSIS — M609 Myositis, unspecified: Secondary | ICD-10-CM | POA: Diagnosis present

## 2017-12-10 DIAGNOSIS — Z79890 Hormone replacement therapy (postmenopausal): Secondary | ICD-10-CM

## 2017-12-10 DIAGNOSIS — I482 Chronic atrial fibrillation: Secondary | ICD-10-CM | POA: Diagnosis not present

## 2017-12-10 DIAGNOSIS — M1990 Unspecified osteoarthritis, unspecified site: Secondary | ICD-10-CM | POA: Diagnosis present

## 2017-12-10 DIAGNOSIS — W010XXA Fall on same level from slipping, tripping and stumbling without subsequent striking against object, initial encounter: Secondary | ICD-10-CM | POA: Diagnosis present

## 2017-12-10 DIAGNOSIS — R2681 Unsteadiness on feet: Secondary | ICD-10-CM | POA: Diagnosis not present

## 2017-12-10 DIAGNOSIS — Z9181 History of falling: Secondary | ICD-10-CM

## 2017-12-10 DIAGNOSIS — Z955 Presence of coronary angioplasty implant and graft: Secondary | ICD-10-CM

## 2017-12-10 DIAGNOSIS — D509 Iron deficiency anemia, unspecified: Secondary | ICD-10-CM | POA: Diagnosis present

## 2017-12-10 DIAGNOSIS — I4729 Other ventricular tachycardia: Secondary | ICD-10-CM

## 2017-12-10 DIAGNOSIS — I340 Nonrheumatic mitral (valve) insufficiency: Secondary | ICD-10-CM | POA: Diagnosis present

## 2017-12-10 DIAGNOSIS — Z8349 Family history of other endocrine, nutritional and metabolic diseases: Secondary | ICD-10-CM

## 2017-12-10 DIAGNOSIS — Z66 Do not resuscitate: Secondary | ICD-10-CM | POA: Diagnosis present

## 2017-12-10 DIAGNOSIS — R531 Weakness: Secondary | ICD-10-CM | POA: Diagnosis not present

## 2017-12-10 DIAGNOSIS — E860 Dehydration: Secondary | ICD-10-CM | POA: Diagnosis not present

## 2017-12-10 DIAGNOSIS — R600 Localized edema: Secondary | ICD-10-CM | POA: Diagnosis present

## 2017-12-10 DIAGNOSIS — E785 Hyperlipidemia, unspecified: Secondary | ICD-10-CM | POA: Diagnosis present

## 2017-12-10 DIAGNOSIS — L03115 Cellulitis of right lower limb: Secondary | ICD-10-CM | POA: Diagnosis not present

## 2017-12-10 DIAGNOSIS — H269 Unspecified cataract: Secondary | ICD-10-CM | POA: Diagnosis present

## 2017-12-10 DIAGNOSIS — E039 Hypothyroidism, unspecified: Secondary | ICD-10-CM | POA: Diagnosis not present

## 2017-12-10 DIAGNOSIS — Z87891 Personal history of nicotine dependence: Secondary | ICD-10-CM

## 2017-12-10 DIAGNOSIS — G950 Syringomyelia and syringobulbia: Secondary | ICD-10-CM | POA: Diagnosis not present

## 2017-12-10 DIAGNOSIS — Z79899 Other long term (current) drug therapy: Secondary | ICD-10-CM

## 2017-12-10 DIAGNOSIS — I4891 Unspecified atrial fibrillation: Secondary | ICD-10-CM | POA: Diagnosis present

## 2017-12-10 DIAGNOSIS — Y92003 Bedroom of unspecified non-institutional (private) residence as the place of occurrence of the external cause: Secondary | ICD-10-CM

## 2017-12-10 DIAGNOSIS — E7800 Pure hypercholesterolemia, unspecified: Secondary | ICD-10-CM | POA: Diagnosis present

## 2017-12-10 DIAGNOSIS — I10 Essential (primary) hypertension: Secondary | ICD-10-CM | POA: Diagnosis present

## 2017-12-10 DIAGNOSIS — I252 Old myocardial infarction: Secondary | ICD-10-CM

## 2017-12-10 DIAGNOSIS — Z7901 Long term (current) use of anticoagulants: Secondary | ICD-10-CM

## 2017-12-10 DIAGNOSIS — R32 Unspecified urinary incontinence: Secondary | ICD-10-CM | POA: Diagnosis present

## 2017-12-10 DIAGNOSIS — Z96652 Presence of left artificial knee joint: Secondary | ICD-10-CM | POA: Diagnosis present

## 2017-12-10 DIAGNOSIS — R270 Ataxia, unspecified: Secondary | ICD-10-CM

## 2017-12-10 DIAGNOSIS — I444 Left anterior fascicular block: Secondary | ICD-10-CM | POA: Diagnosis present

## 2017-12-10 DIAGNOSIS — H409 Unspecified glaucoma: Secondary | ICD-10-CM | POA: Diagnosis present

## 2017-12-10 DIAGNOSIS — Z8249 Family history of ischemic heart disease and other diseases of the circulatory system: Secondary | ICD-10-CM

## 2017-12-10 DIAGNOSIS — M069 Rheumatoid arthritis, unspecified: Secondary | ICD-10-CM | POA: Diagnosis present

## 2017-12-10 DIAGNOSIS — Z85828 Personal history of other malignant neoplasm of skin: Secondary | ICD-10-CM

## 2017-12-10 DIAGNOSIS — I472 Ventricular tachycardia: Secondary | ICD-10-CM | POA: Diagnosis present

## 2017-12-10 DIAGNOSIS — I4892 Unspecified atrial flutter: Secondary | ICD-10-CM | POA: Diagnosis present

## 2017-12-10 DIAGNOSIS — R001 Bradycardia, unspecified: Secondary | ICD-10-CM | POA: Diagnosis present

## 2017-12-10 LAB — CBC WITH DIFFERENTIAL/PLATELET
BASOS ABS: 0 10*3/uL (ref 0.0–0.1)
BASOS PCT: 0 %
EOS ABS: 0 10*3/uL (ref 0.0–0.7)
Eosinophils Relative: 0 %
HEMATOCRIT: 31.2 % — AB (ref 39.0–52.0)
HEMOGLOBIN: 9.9 g/dL — AB (ref 13.0–17.0)
Lymphocytes Relative: 9 %
Lymphs Abs: 1 10*3/uL (ref 0.7–4.0)
MCH: 32 pg (ref 26.0–34.0)
MCHC: 31.7 g/dL (ref 30.0–36.0)
MCV: 101 fL — ABNORMAL HIGH (ref 78.0–100.0)
MONOS PCT: 14 %
Monocytes Absolute: 1.4 10*3/uL — ABNORMAL HIGH (ref 0.1–1.0)
NEUTROS ABS: 8.1 10*3/uL — AB (ref 1.7–7.7)
NEUTROS PCT: 77 %
Platelets: 572 10*3/uL — ABNORMAL HIGH (ref 150–400)
RBC: 3.09 MIL/uL — ABNORMAL LOW (ref 4.22–5.81)
RDW: 15.2 % (ref 11.5–15.5)
WBC: 10.5 10*3/uL (ref 4.0–10.5)

## 2017-12-10 LAB — URINALYSIS, COMPLETE (UACMP) WITH MICROSCOPIC
BACTERIA UA: NONE SEEN
BILIRUBIN URINE: NEGATIVE
Glucose, UA: NEGATIVE mg/dL
HGB URINE DIPSTICK: NEGATIVE
Ketones, ur: NEGATIVE mg/dL
LEUKOCYTES UA: NEGATIVE
NITRITE: NEGATIVE
PROTEIN: NEGATIVE mg/dL
SPECIFIC GRAVITY, URINE: 1.019 (ref 1.005–1.030)
pH: 5 (ref 5.0–8.0)

## 2017-12-10 LAB — BASIC METABOLIC PANEL
ANION GAP: 5 (ref 5–15)
BUN: 12 mg/dL (ref 6–20)
CALCIUM: 8.2 mg/dL — AB (ref 8.9–10.3)
CO2: 28 mmol/L (ref 22–32)
CREATININE: 0.77 mg/dL (ref 0.61–1.24)
Chloride: 100 mmol/L — ABNORMAL LOW (ref 101–111)
Glucose, Bld: 115 mg/dL — ABNORMAL HIGH (ref 65–99)
Potassium: 4.3 mmol/L (ref 3.5–5.1)
SODIUM: 133 mmol/L — AB (ref 135–145)

## 2017-12-10 LAB — IRON AND TIBC
IRON: 25 ug/dL — AB (ref 45–182)
Saturation Ratios: 12 % — ABNORMAL LOW (ref 17.9–39.5)
TIBC: 210 ug/dL — AB (ref 250–450)
UIBC: 185 ug/dL

## 2017-12-10 LAB — TROPONIN I
Troponin I: <0.03 ng/mL (ref <0.03)
Troponin I: <0.03 ng/mL (ref <0.03)

## 2017-12-10 LAB — BRAIN NATRIURETIC PEPTIDE: B NATRIURETIC PEPTIDE 5: 258 pg/mL — AB (ref 0.0–100.0)

## 2017-12-10 LAB — LACTIC ACID, PLASMA: LACTIC ACID, VENOUS: 1.6 mmol/L (ref 0.5–1.9)

## 2017-12-10 LAB — FERRITIN: Ferritin: 577 ng/mL — ABNORMAL HIGH (ref 24–336)

## 2017-12-10 MED ORDER — ONDANSETRON HCL 4 MG PO TABS: 4.0000 mg | ORAL_TABLET | Freq: Four times a day (QID) | ORAL | Status: DC | PRN

## 2017-12-10 MED ORDER — SODIUM CHLORIDE 0.9 % IV BOLUS (SEPSIS)
500.0000 mL | Freq: Once | INTRAVENOUS | Status: AC
Start: 2017-12-10 — End: 2017-12-10
  Administered 2017-12-10: 500 mL via INTRAVENOUS

## 2017-12-10 MED ORDER — POLYETHYLENE GLYCOL 3350 17 G PO PACK: 17.0000 g | PACK | Freq: Every day | ORAL | Status: DC | PRN

## 2017-12-10 MED ORDER — ACETAMINOPHEN 650 MG RE SUPP: 650.0000 mg | Freq: Four times a day (QID) | RECTAL | Status: DC | PRN

## 2017-12-10 MED ORDER — ONDANSETRON HCL 4 MG/2ML IJ SOLN: 4.0000 mg | Freq: Four times a day (QID) | INTRAMUSCULAR | Status: DC | PRN

## 2017-12-10 MED ORDER — CEPHALEXIN 500 MG PO CAPS
500.0000 mg | ORAL_CAPSULE | Freq: Four times a day (QID) | ORAL | Status: DC
  Administered 2017-12-10 – 2017-12-15 (×17): 500 mg via ORAL
  Filled 2017-12-10 (×18): qty 1

## 2017-12-10 MED ORDER — ACETAMINOPHEN 325 MG PO TABS: 650.0000 mg | ORAL_TABLET | Freq: Four times a day (QID) | ORAL | Status: DC | PRN

## 2017-12-10 MED ORDER — SODIUM CHLORIDE 0.9 % IV SOLN: INTRAVENOUS | Status: DC

## 2017-12-10 MED ORDER — VANCOMYCIN HCL IN DEXTROSE 1-5 GM/200ML-% IV SOLN
1000.0000 mg | Freq: Once | INTRAVENOUS | Status: AC
  Administered 2017-12-10: 1000 mg via INTRAVENOUS
  Filled 2017-12-10: qty 200

## 2017-12-10 NOTE — ED Provider Notes (Signed)
[facility] EMERGENCY DEPARTMENT Provider Note   CSN: [id number] Arrival date & time: 12/10/17  0865     History   Chief Complaint Chief Complaint  Patient presents with  . Weakness    HPI [name] is a 76 y.o. male.  Patient brought in by EMS.  Patient normally lives with his daughter.  But the family is out of town in [place] on vacation.  Patient lost his balance at home and fell backwards landing mostly on his buttocks.  No real complaints of pain from the fall.  Patient stated he just lost his balance.  There has been a history of a recent fall.  Patient states he is having trouble walking.      Past Medical History:  Diagnosis Date  . A-fib ([place])   . Arthritis    osteoarthritis  . Basal cell carcinoma (BCC) 08/29/2017  . Cataract   . Frequent falls   . Glaucoma   . High cholesterol   . Hypertension   . MI (myocardial infarction) ([place])   . Rheumatoid arthritis ([place])   . Thyroid disease     Patient Active Problem List   Diagnosis Date Noted  . Basal cell carcinoma (BCC) 08/29/2017  . Actinic keratosis 08/20/2017  . Essential hypertension 04/30/2017  . Hypothyroid 04/30/2017  . HLD (hyperlipidemia) 04/30/2017  . CAD in native artery 04/30/2017  . 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 Pedal edema 04/30/2017  . Glaucoma 04/30/2017  . Abdominal aortic atherosclerosis ([place]) 04/30/2017  . 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 Asymptomatic gallstones 04/30/2017  . Benign prostatic hyperplasia 04/30/2017  . Diverticulosis 04/30/2017  . Esophagitis 04/30/2017  . Angular cheilitis 04/30/2017  . A-fib ([place]) 04/11/2017  . Rheumatoid arthritis ([place]) 04/11/2017    Past Surgical History:  Procedure Laterality Date  . CORONARY ANGIOPLASTY WITH STENT PLACEMENT    . JOINT REPLACEMENT     left knee  . REPLACEMENT TOTAL KNEE Left        Home Medications    Prior to Admission medications   Medication Sig Start Date End  Date Taking? Authorizing Provider  inFLIXimab (REMICADE) 100 MG injection Inject into the vein. 02/24/15  Yes [provider]  rivaroxaban (XARELTO) 20 MG TABS tablet Take 1 tablet (20 mg total) by mouth daily with supper. Patient taking differently: Take 20 mg by mouth every morning.  04/16/17  Yes Herminio Commons, MD  acetaminophen (TYLENOL) 500 MG tablet Take 1,000 mg by mouth every 6 (six) hours as needed.    [provider]  folic acid (FOLVITE) 1 MG tablet Take 1 tablet (1 mg total) by mouth daily. 04/30/17   Raylene Everts, MD  furosemide (LASIX) 40 MG tablet Take 1 tablet (40 mg total) by mouth daily as needed. 08/16/17 11/14/17  Lendon Colonel, NP  levothyroxine (SYNTHROID, LEVOTHROID) 75 MCG tablet Take 1 tablet (75 mcg total) by mouth daily before breakfast. 04/30/17   Raylene Everts, MD  lisinopril (PRINIVIL,ZESTRIL) 2.5 MG tablet Take 1 tablet (2.5 mg total) by mouth daily. 08/16/17 11/14/17  Lendon Colonel, NP  methotrexate 2.5 MG tablet Take 15 mg by mouth every Sunday. 6 tablets on Sunday    [provider]  neomycin-polymyxin-hydrocortisone (CORTISPORIN) OTIC solution Place 3 drops into both ears 4 (four) times daily. 08/06/17   Raylene Everts, MD  polycarbophil (FIBERCON) 625 MG tablet Take 625 mg by mouth at bedtime.    [provider]  potassium chloride SA (K-DUR,KLOR-CON) 20 MEQ tablet [place] Potassium 20 meq daily for 5 days and then daily as NEEDED for leg swelling Patient taking differently: Take 20 mEq by mouth daily as needed. as NEEDED for leg swelling 04/16/17   Herminio Commons, MD  simvastatin (ZOCOR) 40 MG tablet Take 1 tablet (40 mg total) by mouth daily. 04/30/17   Raylene Everts, MD  timolol (TIMOPTIC) 0.5 % ophthalmic solution INSTILL ONE DROP INTO EACH EYE TWICE DAILY 12/15/13   [provider]    Family History Family History  Problem Relation Age of Onset  . Heart attack Father 64  . Early  death Father   . Alzheimer's disease Mother 70  . Early death Sister        MVA  . Hyperlipidemia Brother   . Heart disease Brother     Social History Social History   Tobacco Use  . Smoking status: Former Smoker    Years: 15.00    Types: Cigarettes    Last attempt to quit: 12/26/1975    Years since quitting: 41.9  . Smokeless tobacco: Never Used  Substance Use Topics  . Alcohol use: No  . Drug use: No     Allergies   Patient has no known allergies.   Review of Systems Review of Systems  Constitutional: Positive for fatigue. Negative for fever.  HENT: Negative for congestion.   Eyes: Negative for redness.  Respiratory: Negative for shortness of breath.   Cardiovascular: Negative for chest pain.  Gastrointestinal: Negative for abdominal pain.  Genitourinary: Negative for dysuria.  Musculoskeletal: Negative for back pain and neck pain.  Skin: Negative for wound.  Neurological: Positive for weakness. Negative for headaches.  Hematological: Does not bruise/bleed easily.  Psychiatric/Behavioral: Negative for confusion.     Physical Exam Updated Vital Signs BP 121/75   Pulse (!) 59   Temp 98.3 F (36.8 C) (Oral)   Resp 18   Ht 1.803 m (5\' 11" )   Wt 79.4 kg (175 lb)   SpO2 99%   BMI 24.41 kg/m   Physical Exam  Constitutional: He is oriented to person, place, and time. He appears well-developed and well-nourished. No distress.  HENT:  Head: Normocephalic and atraumatic.  Mucous membranes dry.  Cardiovascular: Normal rate, regular rhythm and normal heart sounds.  Pulmonary/Chest: Effort normal and breath sounds normal.  Abdominal: Soft. Bowel sounds are normal. There is no tenderness.  Musculoskeletal: Normal range of motion. He exhibits edema. He exhibits no deformity.  Marked bilateral edema.  Redness to the anterior part of the shin of the right leg only.  Neurological: He is alert and oriented to person, place, and time. No cranial nerve deficit.  Patient  very unsteady on his feet.  Not able to walk on his own.  Even with his cane.  Just with standing patient almost fell backwards.  Nursing note and vitals reviewed.    ED Treatments / Results  Labs (all labs ordered are listed, but only abnormal results are displayed) Labs Reviewed  BASIC METABOLIC PANEL - Abnormal; Notable for the following components:      Result Value   Sodium 133 (*)    Chloride 100 (*)    Glucose, Bld 115 (*)    Calcium 8.2 (*)    All other components within normal limits  CBC WITH DIFFERENTIAL/PLATELET - Abnormal; Notable for the following components:   RBC 3.09 (*)    Hemoglobin 9.9 (*)    HCT 31.2 (*)  MCV 101.0 (*)    Platelets 572 (*)    Neutro Abs 8.1 (*)    Monocytes Absolute 1.4 (*)    All other components within normal limits  BRAIN NATRIURETIC PEPTIDE - Abnormal; Notable for the following components:   B Natriuretic Peptide 258.0 (*)    All other components within normal limits  CULTURE, BLOOD (ROUTINE X 2)  LACTIC ACID, PLASMA  URINALYSIS, ROUTINE W REFLEX MICROSCOPIC    EKG  EKG Interpretation  Date/Time:  Monday December 10 2017 09:26:10 EST Ventricular Rate:  70 PR Interval:    QRS Duration: 97 QT Interval:  420 QTC Calculation: 454 R Axis:   -64 Text Interpretation:  Atrial fibrillation Ventricular premature complex LAD, consider left anterior fascicular block Anteroseptal infarct, old No previous ECGs available Confirmed by Fredia Sorrow [phone]) on 12/10/2017 9:30:27 AM       Radiology Dg Chest 2 View  Result Date: 12/10/2017 CLINICAL DATA:  Weakness for 2-4 weeks, worsening. EXAM: CHEST  2 VIEW COMPARISON:  None. FINDINGS: There is cardiomegaly without edema. Lungs are clear. No pneumothorax or pleural effusion. Aortic atherosclerosis is noted. No acute bony abnormality. Remote fracture of the distal right clavicle is noted. IMPRESSION: Cardiomegaly without acute disease. Atherosclerosis. Electronically Signed   By: Inge Rise M.D.   On: 12/10/2017 10:59   Ct Head Wo Contrast  Result Date: 12/10/2017 CLINICAL DATA:  The patient suffered an episode of dizziness with a fall this morning. EXAM: CT HEAD WITHOUT CONTRAST TECHNIQUE: Contiguous axial images were obtained from the base of the skull through the vertex without intravenous contrast. COMPARISON:  Head CT scan 05/18/2017. FINDINGS: Brain: There is mild atrophy and some chronic microvascular ischemic change. No evidence of acute abnormality including hemorrhage, infarct, mass lesion, mass effect, midline shift or abnormal extra-axial fluid collection. No hydrocephalus or pneumocephalus. Vascular: Atherosclerosis noted. Skull: Intact. Sinuses/Orbits: There is partial visualization of marked mucosal thickening in the right maxillary sinus with associated wall thickening consistent with chronic change. This finding was present on the prior exam. Other: None. IMPRESSION: No acute abnormality. Mild atrophy and chronic microvascular change. Chronic right maxillary sinus disease. Electronically Signed   By: Inge Rise M.D.   On: 12/10/2017 11:16   Mr Brain Wo Contrast (neuro Protocol)  Result Date: 12/10/2017 CLINICAL DATA:  Ataxia with stroke suspected. EXAM: MRI HEAD WITHOUT CONTRAST TECHNIQUE: Multiplanar, multiecho pulse sequences of the brain and surrounding structures were obtained without intravenous contrast. COMPARISON:  Head CT from earlier today FINDINGS: Brain: No acute infarction, hemorrhage, hydrocephalus, extra-axial collection or mass lesion. Generalized cortical atrophy and mild chronic microvascular ischemic change in the cerebral white matter. Vascular: Major flow voids are preserved. Skull and upper cervical spine: Negative for marrow lesion Sinuses/Orbits: Chronic right maxillary sinusitis with inspissated secretions and atelectasis. Chronic mastoid opacification that is improved from CT 09/05/2017 IMPRESSION: 1. Senescent changes without acute  finding including infarct. 2. Chronic right maxillary sinusitis. 3. Mastoid opacification that is improved from CT 09/05/2017. Electronically Signed   By: Monte Fantasia M.D.   On: 12/10/2017 12:20    Procedures Procedures (including critical care time)  Medications Ordered in ED Medications  0.9 %  sodium chloride infusion (not administered)  sodium chloride 0.9 % bolus 500 mL (0 mLs Intravenous Stopped 12/10/17 1414)     Initial Impression / Assessment and Plan / ED Course  I have reviewed the triage vital signs and the nursing notes.  Pertinent labs & imaging results that were available  during my care of the patient were reviewed by me and considered in my medical decision making (see chart for details).  Clinical Course as of Dec 11 1419  Mon Dec 10, 2017  1017 Hemoglobin: (!) 9.9 [KT]    Clinical Course User Index [KT] Diego Cory, [place]   Patient very unsteady on his feet.  Extensive workup without any significant findings.  Bilateral leg edema but also no evidence of pulmonary edema.  Urinalysis still pending.  CT head and MRI brain without explanation for the unsteadiness.  Patient clinically does appear a little dehydrated.  But labs do not reflects serious dehydration.  Mild anemia.  Redness to the right leg may be secondary to chronic edema but since is not present on the left leg suspect that this is perhaps a cellulitis.  Patient did receive IV antibiotics for that.  Patient is home alone family is out of town and not due to return until the 22nd.  Patient warrants admission will discuss with hospitalist.   Final Clinical Impressions(s) / ED Diagnoses   Final diagnoses:  Weakness  Dehydration  Unsteady gait  Cellulitis of right lower extremity    ED Discharge Orders    None       Fredia Sorrow, MD 12/10/17 1424

## 2017-12-10 NOTE — H&P (Addendum)
History and Physical    [name] VWU:[id number] DOB: [birth year] DOA: 12/10/2017  PCP: Raylene Everts, MD Patient coming from: Home  Chief Complaint: Fall  HPI: [name] is a 76 y.o. male with medical history significant of atrial fibrillation, falls, HTN, RA, HLD, presented to the ED via EMS after a fall at home.    Patient reports he woke up and went into his bathroom and brushed his teeth. Patient reports he had a fall 2-3 weeks ago in which he fell face down on the sidewalk.  His legs started swelling at that time and Dr. Meda Coffee gave him medication to take care of this.  He does not recall the name of that medication but he says it was over the counter.  For about 2 weeks he reports the swelling stayed about the same and he had to use a cane to walk around.  He voices everything seemed fine until this am.  After brushing his teeth he went back into his bedroom to straighten the sheets and at one point he lost his balance and rolled back.  He fell backwards to his butt and attempted to get up.  He could not and so he crawled to his bedside table.  He attempted to use the table as leverage and still could not get up.  He had a life alert button and he pushed it to get help.   Patient reports he has had some incontinence today but then states he has been unable to urinate since this morning.  He denies loss of control of his bowels.  Patient states he lives alone but lives 5 minutes from his daughter who is currently in [place].  When reviewing patient's charts it appears that patient was seen on 11/27/2017 by his primary care physician who noted he was too weak to get up from a seated position as well as his unsteadiness with his gait.  It appears in her assessment and plan I discussed a referral to neurology as well as physical therapy even though he had just recently completed a course of physical therapy.  Dr. Meda Coffee also notes that patient sees a rheumatologist and she was  going to call the rheumatologist to gain insight into what could possibly be going on.  Patient denies that he has been unable to get up from a seated position and that he has been using a cane to walk.  He says that prior to the fall that he told me about previously he had been walking without concerns.  He denies fevers, chills, chest pain, chest pressure, lightheadedness, dizziness, shortness of breath, nausea, vomiting, diarrhea, constipation.  ED Course: Patient was seen by EDP.  Initial vital signs showed a respiratory rate of 14, pulse rate 55, blood pressure 122/59, and oxygen of 100% on room air.  Patient underwent CT of the head as well as MRI of the brain which showed no findings to explain his weakness.  He was unable to ambulate in the emergency department and was found to have right lower extremity erythema with concern for cellulitis.  Patient was started on IV vancomycin and [facility] was asked to admit for cellulitis and further evaluation of patient's ataxia.  Review of Systems: As per HPI otherwise 10 point review of systems negative.    Past Medical History:  Diagnosis Date  . A-fib ([place])   . Arthritis    osteoarthritis  . Basal cell carcinoma (BCC) 08/29/2017  . Cataract   . Frequent  falls   . Glaucoma   . High cholesterol   . Hypertension   . MI (myocardial infarction) ([place])   . Rheumatoid arthritis ([place])   . Thyroid disease     Past Surgical History:  Procedure Laterality Date  . CORONARY ANGIOPLASTY WITH STENT PLACEMENT    . JOINT REPLACEMENT     left knee  . REPLACEMENT TOTAL KNEE Left      reports that he quit smoking about 41 years ago. His smoking use included cigarettes. He quit after 15.00 years of use. he has never used smokeless tobacco. He reports that he does not drink alcohol or use drugs.  No Known Allergies  Family History  Problem Relation Age of Onset  . Heart attack Father 76  . Early death Father   . Alzheimer's disease Mother 68  . Early  death Sister        MVA  . Hyperlipidemia Brother   . Heart disease Brother     Prior to Admission medications   Medication Sig Start Date End Date Taking? Authorizing Provider  inFLIXimab (REMICADE) 100 MG injection Inject into the vein. 02/24/15  Yes [provider]  rivaroxaban (XARELTO) 20 MG TABS tablet Take 1 tablet (20 mg total) by mouth daily with supper. Patient taking differently: Take 20 mg by mouth every morning.  04/16/17  Yes Herminio Commons, MD  acetaminophen (TYLENOL) 500 MG tablet Take 1,000 mg by mouth every 6 (six) hours as needed.    [provider]  folic acid (FOLVITE) 1 MG tablet Take 1 tablet (1 mg total) by mouth daily. 04/30/17   Raylene Everts, MD  furosemide (LASIX) 40 MG tablet Take 1 tablet (40 mg total) by mouth daily as needed. 08/16/17 11/14/17  Lendon Colonel, NP  levothyroxine (SYNTHROID, LEVOTHROID) 75 MCG tablet Take 1 tablet (75 mcg total) by mouth daily before breakfast. 04/30/17   Raylene Everts, MD  lisinopril (PRINIVIL,ZESTRIL) 2.5 MG tablet Take 1 tablet (2.5 mg total) by mouth daily. 08/16/17 11/14/17  Lendon Colonel, NP  methotrexate 2.5 MG tablet Take 15 mg by mouth every Sunday. 6 tablets on Sunday    [provider]  neomycin-polymyxin-hydrocortisone (CORTISPORIN) OTIC solution Place 3 drops into both ears 4 (four) times daily. 08/06/17   Raylene Everts, MD  polycarbophil (FIBERCON) 625 MG tablet Take 625 mg by mouth at bedtime.    [provider]  potassium chloride SA (K-DUR,KLOR-CON) 20 MEQ tablet Tale Potassium 20 meq daily for 5 days and then daily as NEEDED for leg swelling Patient taking differently: Take 20 mEq by mouth daily as needed. as NEEDED for leg swelling 04/16/17   Herminio Commons, MD  simvastatin (ZOCOR) 40 MG tablet Take 1 tablet (40 mg total) by mouth daily. 04/30/17   Raylene Everts, MD  timolol (TIMOPTIC) 0.5 % ophthalmic solution INSTILL ONE DROP INTO [facility] EYE TWICE  DAILY 12/15/13   [provider]    Physical Exam: Vitals:   12/10/17 1030 12/10/17 1220 12/10/17 1451 12/10/17 1553  BP: 114/66 121/75 120/71 115/84  Pulse:  (!) 59 61 62  Resp: (!) 0 18 15 13   Temp:      TempSrc:      SpO2:  99% 100% 97%  Weight:      Height:          Constitutional: NAD, calm, comfortable Vitals:   12/10/17 1030 12/10/17 1220 12/10/17 1451 12/10/17 1553  BP: 114/66 121/75 120/71 115/84  Pulse:  (!) 59 61 62  Resp: (!) 0 18 15 13   Temp:      TempSrc:      SpO2:  99% 100% 97%  Weight:      Height:       Eyes: PERRL, lids and conjunctivae normal ENMT: Mucous membranes are moist. Posterior pharynx clear of any exudate or lesions.Normal dentition.  Neck: normal, supple, no masses, no thyromegaly Respiratory: clear to auscultation bilaterally, no wheezing, no crackles. Normal respiratory effort. No accessory muscle use.  Cardiovascular: Regular rate and rhythm, no murmurs / rubs / gallops.  2+ edema to the knees bilaterally. 2+ pedal pulses. No carotid bruits.  Abdomen: no tenderness, no masses palpated. No hepatosplenomegaly. Bowel sounds positive.  Musculoskeletal: no clubbing / cyanosis. No joint deformity upper and lower extremities. Good ROM, no contractures. Normal muscle tone.  Skin: Erythema of the right lower extremity (see pictures)     Neurologic: CN 2-12 grossly intact. Sensation intact, DTR normal.  Slightly weak dorsiflexion at the ankle bilaterally but otherwise intact strength of the lower extremities, intact sensation of the lower extremities Psychiatric: Questionable insight t. Alert and oriented x 3. Normal mood.    Labs on Admission: I have personally reviewed following labs and imaging studies  CBC: Recent Labs  Lab 12/10/17 0948  WBC 10.5  NEUTROABS 8.1*  HGB 9.9*  HCT 31.2*  MCV 101.0*  PLT 161*   Basic Metabolic Panel: Recent Labs  Lab 12/10/17 0948  NA 133*  K 4.3  CL 100*  CO2 28  GLUCOSE 115*  BUN 12   CREATININE 0.77  CALCIUM 8.2*   GFR: Estimated Creatinine Clearance: 83.7 mL/min (by C-G formula based on SCr of 0.77 mg/dL). Liver Function Tests: No results for input(s): AST, ALT, ALKPHOS, BILITOT, PROT, ALBUMIN in the last 168 hours. No results for input(s): LIPASE, AMYLASE in the last 168 hours. No results for input(s): AMMONIA in the last 168 hours. Coagulation Profile: No results for input(s): INR, PROTIME in the last 168 hours. Cardiac Enzymes: No results for input(s): CKTOTAL, CKMB, CKMBINDEX, TROPONINI in the last 168 hours. BNP (last 3 results) No results for input(s): PROBNP in the last 8760 hours. HbA1C: No results for input(s): HGBA1C in the last 72 hours. CBG: No results for input(s): GLUCAP in the last 168 hours. Lipid Profile: No results for input(s): CHOL, HDL, LDLCALC, TRIG, CHOLHDL, LDLDIRECT in the last 72 hours. Thyroid Function Tests: No results for input(s): TSH, T4TOTAL, FREET4, T3FREE, THYROIDAB in the last 72 hours. Anemia Panel: No results for input(s): VITAMINB12, FOLATE, FERRITIN, TIBC, IRON, RETICCTPCT in the last 72 hours. Urine analysis:    Component Value Date/Time   COLORURINE YELLOW 04/30/2017 1623   APPEARANCEUR CLEAR 04/30/2017 1623   LABSPEC 1.010 04/30/2017 1623   PHURINE 5.5 04/30/2017 1623   GLUCOSEU NEGATIVE 04/30/2017 1623   HGBUR NEGATIVE 04/30/2017 1623   BILIRUBINUR NEGATIVE 04/30/2017 1623   KETONESUR NEGATIVE 04/30/2017 1623   PROTEINUR NEGATIVE 04/30/2017 1623   NITRITE NEGATIVE 04/30/2017 1623   LEUKOCYTESUR NEGATIVE 04/30/2017 1623   Sepsis Labs: !!!!!!!!!!!!!!!!!!!!!!!!!!!!!!!!!!!!!!!!!!!! @LABRCNTIP (procalcitonin:4,lacticidven:4) ) Recent Results (from the past 240 hour(s))  Culture, blood (routine x 2)     Status: None (Preliminary result)   Collection Time: 12/10/17  9:49 AM  Result Value Ref Range Status   Specimen Description BLOOD RIGHT ARM  Final   Special Requests   Final    BOTTLES DRAWN AEROBIC AND  ANAEROBIC Blood Culture adequate volume   Culture PENDING  Incomplete   Report Status PENDING  Incomplete     Radiological Exams on Admission: Dg Chest 2 View  Result Date: 12/10/2017 CLINICAL DATA:  Weakness for 2-4 weeks, worsening. EXAM: CHEST  2 VIEW COMPARISON:  None. FINDINGS: There is cardiomegaly without edema. Lungs are clear. No pneumothorax or pleural effusion. Aortic atherosclerosis is noted. No acute bony abnormality. Remote fracture of the distal right clavicle is noted. IMPRESSION: Cardiomegaly without acute disease. Atherosclerosis. Electronically Signed   By: Inge Rise M.D.   On: 12/10/2017 10:59   Ct Head Wo Contrast  Result Date: 12/10/2017 CLINICAL DATA:  The patient suffered an episode of dizziness with a fall this morning. EXAM: CT HEAD WITHOUT CONTRAST TECHNIQUE: Contiguous axial images were obtained from the base of the skull through the vertex without intravenous contrast. COMPARISON:  Head CT scan 05/18/2017. FINDINGS: Brain: There is mild atrophy and some chronic microvascular ischemic change. No evidence of acute abnormality including hemorrhage, infarct, mass lesion, mass effect, midline shift or abnormal extra-axial fluid collection. No hydrocephalus or pneumocephalus. Vascular: Atherosclerosis noted. Skull: Intact. Sinuses/Orbits: There is partial visualization of marked mucosal thickening in the right maxillary sinus with associated wall thickening consistent with chronic change. This finding was present on the prior exam. Other: None. IMPRESSION: No acute abnormality. Mild atrophy and chronic microvascular change. Chronic right maxillary sinus disease. Electronically Signed   By: Inge Rise M.D.   On: 12/10/2017 11:16   Mr Brain Wo Contrast (neuro Protocol)  Result Date: 12/10/2017 CLINICAL DATA:  Ataxia with stroke suspected. EXAM: MRI HEAD WITHOUT CONTRAST TECHNIQUE: Multiplanar, multiecho pulse sequences of the brain and surrounding structures were  obtained without intravenous contrast. COMPARISON:  Head CT from earlier today FINDINGS: Brain: No acute infarction, hemorrhage, hydrocephalus, extra-axial collection or mass lesion. Generalized cortical atrophy and mild chronic microvascular ischemic change in the cerebral white matter. Vascular: Major flow voids are preserved. Skull and upper cervical spine: Negative for marrow lesion Sinuses/Orbits: Chronic right maxillary sinusitis with inspissated secretions and atelectasis. Chronic mastoid opacification that is improved from CT 09/05/2017 IMPRESSION: 1. Senescent changes without acute finding including infarct. 2. Chronic right maxillary sinusitis. 3. Mastoid opacification that is improved from CT 09/05/2017. Electronically Signed   By: Monte Fantasia M.D.   On: 12/10/2017 12:20    EKG: Independently reviewed. Atrial fibrillations  Assessment/Plan Active Problems:   A-fib (HCC)   Essential hypertension   Hypothyroid   HLD (hyperlipidemia)   Ataxia   Unsteady gait   Cellulitis of right lower extremity   Ataxia and weakness - blood cultures - troponins x 3 - consult neurology - MRI of lumbar spine - PT/OT eval and treat - patient has a history of recurrent numerous falls per his daughter  Atrial fibrillation - telemetry - hold xarelto- patient is a high fall risk  - ambulate with assistance only - heart rate well controlled  HTN - continue lisinopril  Lower extremity edema - continue lasix and potassium  Cellulitis - will transition to IV - nonpurulent  HLD - continue statin  Anemia - iron panel ordered  DVT prophylaxis: Patient on Xarelto at home  Code Status: Full code  Family Communication: I called patient's daughter who is listed as his emergency contact  Disposition Plan: Pending evaluation by PT and neurology Consults called: None  Admission status: Observation, telemetry   Loretha Stapler MD [facility] Pager [phone]  If 7PM-7AM,  please contact night-coverage [email or web address] Password [email or web address]  12/10/2017, 4:22 PM

## 2017-12-10 NOTE — ED Triage Notes (Signed)
PT hit his medical alert this morning after loosing his balance and falling to the floor. EMS had to use forceful entry into the house bc pt's family members are on vacation and pt states his PCP is out of town. PT c/o bilateral lower leg redness and pitiing edema noted with no fevers for x1 week.

## 2017-12-10 NOTE — ED Notes (Signed)
Patient aware that we a need urine sample, reported he "doesn't know why he's struggling to urinate today."

## 2017-12-10 NOTE — ED Notes (Signed)
Theadora Rama, a friend of the family, would like to be notified of any updates in care.  [phone])

## 2017-12-11 ENCOUNTER — Observation Stay (HOSPITAL_COMMUNITY): Payer: Medicare PPO

## 2017-12-11 DIAGNOSIS — R270 Ataxia, unspecified: Secondary | ICD-10-CM | POA: Diagnosis not present

## 2017-12-11 DIAGNOSIS — E860 Dehydration: Secondary | ICD-10-CM | POA: Diagnosis not present

## 2017-12-11 DIAGNOSIS — L03115 Cellulitis of right lower limb: Secondary | ICD-10-CM | POA: Diagnosis not present

## 2017-12-11 DIAGNOSIS — I482 Chronic atrial fibrillation: Secondary | ICD-10-CM | POA: Diagnosis not present

## 2017-12-11 LAB — CSF CELL COUNT WITH DIFFERENTIAL
RBC Count, CSF: 0 /mm3
TUBE #: 4
WBC, CSF: <10 /mm3 — ABNORMAL HIGH (ref 0–5)

## 2017-12-11 LAB — TROPONIN I

## 2017-12-11 LAB — APTT
APTT: 38 s — AB (ref 24–36)
aPTT: 38 seconds — ABNORMAL HIGH (ref 24–36)

## 2017-12-11 LAB — HEPARIN LEVEL (UNFRACTIONATED)
HEPARIN UNFRACTIONATED: 0.12 [IU]/mL — AB (ref 0.30–0.70)
Heparin Unfractionated: 0.12 IU/mL — ABNORMAL LOW (ref 0.30–0.70)

## 2017-12-11 LAB — PROTEIN, CSF: Total  Protein, CSF: 50 mg/dL — ABNORMAL HIGH (ref 15–45)

## 2017-12-11 LAB — GLUCOSE, CSF: GLUCOSE CSF: 60 mg/dL (ref 40–70)

## 2017-12-11 MED ORDER — SODIUM CHLORIDE 0.9 % IV SOLN
1000.0000 mg | Freq: Every day | INTRAVENOUS | Status: DC
  Filled 2017-12-11 (×2): qty 8

## 2017-12-11 MED ORDER — HEPARIN (PORCINE) IN NACL 100-0.45 UNIT/ML-% IJ SOLN: 1200.0000 [IU]/h | INTRAMUSCULAR | Status: DC

## 2017-12-11 MED ORDER — GADOBENATE DIMEGLUMINE 529 MG/ML IV SOLN
15.0000 mL | Freq: Once | INTRAVENOUS | Status: AC | PRN
  Administered 2017-12-11: 15 mL via INTRAVENOUS

## 2017-12-11 MED ORDER — FUROSEMIDE 10 MG/ML IJ SOLN
40.0000 mg | Freq: Every day | INTRAMUSCULAR | Status: DC
  Administered 2017-12-12 – 2017-12-15 (×4): 40 mg via INTRAVENOUS
  Filled 2017-12-11 (×4): qty 4

## 2017-12-11 NOTE — Plan of Care (Signed)
  Acute Rehab PT Goals(only PT should resolve) Patient Will Transfer Sit To/From Stand 12/11/2017 1551 - Progressing by Lonell Grandchild, PT Flowsheets Taken 12/11/2017 1551  Patient will transfer sit to/from stand with supervision Pt Will Transfer Bed To Chair/Chair To Bed 12/11/2017 1551 - Progressing by Lonell Grandchild, PT Flowsheets Taken 12/11/2017 1551  Pt will Transfer Bed to Chair/Chair to Bed with supervision Pt Will Ambulate 12/11/2017 1551 - Progressing by Lonell Grandchild, PT Flowsheets Taken 12/11/2017 1551  Pt will Ambulate 75 feet;with rolling walker;with supervision Pt Will Go Up/Down Stairs 12/11/2017 1551 - Progressing by Lonell Grandchild, PT Flowsheets Taken 12/11/2017 1551  Pt will Go Up / Down Stairs 3-5 stairs;with rail(s);with supervision  3:53 PM, 12/11/17 Lonell Grandchild, MPT Physical Therapist with [facility] [phone] office [phone] mobile phone

## 2017-12-11 NOTE — Progress Notes (Signed)
I called Dr Adair Patter at [phone] and she said she would reschedule the heparin for tomorrow. She ordered the TED hose removed and SCDs applied. I moved the heparin dose to 12/12/2017 at 1000.

## 2017-12-11 NOTE — Progress Notes (Signed)
ANTICOAGULATION CONSULT NOTE - Initial Consult  Pharmacy Consult for heparin Indication: atrial fibrillation  No Known Allergies  Patient Measurements: Height: 5\' 10"  (177.8 cm) Weight: 179 lb (81.2 kg) IBW/kg (Calculated) : 73 Heparin Dosing Weight: 81 kg  Vital Signs: Temp: 98.3 F (36.8 C) (12/18 0605) Temp Source: Oral (12/18 0605) BP: 107/52 (12/18 0605) Pulse Rate: 76 (12/18 0605)  Labs: Recent Labs    12/10/17 0948 12/10/17 1754 12/10/17 2212 12/11/17 0422  HGB 9.9*  --   --   --   HCT 31.2*  --   --   --   PLT 572*  --   --   --   CREATININE 0.77  --   --   --   TROPONINI  --  <0.03 <0.03 <0.03    Estimated Creatinine Clearance: 81.1 mL/min (by C-G formula based on SCr of 0.77 mg/dL).   Medical History: Past Medical History:  Diagnosis Date  . A-fib ([place])   . Arthritis    osteoarthritis  . Basal cell carcinoma (BCC) 08/29/2017  . Cataract   . Frequent falls   . Glaucoma   . High cholesterol   . Hypertension   . MI (myocardial infarction) ([place])   . Rheumatoid arthritis ([place])   . Thyroid disease     Medications:  Medications Prior to Admission  Medication Sig Dispense Refill Last Dose  . folic acid (FOLVITE) 1 MG tablet Take 1 tablet (1 mg total) by mouth daily. 90 tablet 3 Taking  . furosemide (LASIX) 40 MG tablet Take 1 tablet (40 mg total) by mouth daily as needed. 30 tablet 6 Taking  . inFLIXimab (REMICADE) 100 MG injection Inject into the vein.   Past Month at Unknown time  . levothyroxine (SYNTHROID, LEVOTHROID) 75 MCG tablet Take 1 tablet (75 mcg total) by mouth daily before breakfast. 90 tablet 3 Taking  . lisinopril (PRINIVIL,ZESTRIL) 2.5 MG tablet Take 1 tablet (2.5 mg total) by mouth daily. 90 tablet 3 Taking  . methotrexate 2.5 MG tablet Take 15 mg by mouth every Sunday. 6 tablets on Sunday   Taking  . rivaroxaban (XARELTO) 20 MG TABS tablet Take 1 tablet (20 mg total) by mouth daily with supper. (Patient taking differently: Take 20 mg by  mouth every morning. ) 90 tablet 3 12/09/2017 at 1000  . simvastatin (ZOCOR) 40 MG tablet Take 1 tablet (40 mg total) by mouth daily. 90 tablet 3 12/10/2017 at Unknown time  . acetaminophen (TYLENOL) 500 MG tablet Take 1,000 mg by mouth every 6 (six) hours as needed.   Taking  . neomycin-polymyxin-hydrocortisone (CORTISPORIN) OTIC solution Place 3 drops into both ears 4 (four) times daily. (Patient not taking: Reported on 12/10/2017) 10 mL 0 Not Taking at Unknown time  . polycarbophil (FIBERCON) 625 MG tablet Take 625 mg by mouth at bedtime.   Taking  . potassium chloride SA (K-DUR,KLOR-CON) 20 MEQ tablet Tale Potassium 20 meq daily for 5 days and then daily as NEEDED for leg swelling (Patient taking differently: Take 20 mEq by mouth daily as needed. as NEEDED for leg swelling) 90 tablet 3 Taking  . timolol (TIMOPTIC) 0.5 % ophthalmic solution INSTILL ONE DROP INTO EACH EYE TWICE DAILY   Taking    Assessment: 76 yo man who was on xarelto pta now to start heparin drip.  Last dose xarelto 12/16 at 10:00. Goal of Therapy:  Heparin level 0.3-0.7 units/ml aPTT 66-102 seconds Monitor platelets by anticoagulation protocol: Yes   Plan:  Start heparin  drip at 1400 units/hr Check baseline aPTT and HL Check aPTT and HL in 6 hours then daily Monitor for bleeding complications   Ashley Montminy Poteet 12/11/2017,1:38 PM

## 2017-12-11 NOTE — Procedures (Signed)
CLINICAL DATA: [name], cord lesions on thoracic and lumbar MRI examinations.] EXAM: DIAGNOSTIC LUMBAR PUNCTURE UNDER FLUOROSCOPIC GUIDANCE FLUOROSCOPY TIME: Fluoroscopy Time: [0 minutes, 30 seconds] Radiation Exposure Index (if provided by the fluoroscopic device): [3.2 mGy] Number of Acquired Spot Images: [0] PROCEDURE: I discussed the risks (including hemorrhage, infection, headache, and nerve damage, among others), benefits, and alternatives to fluoroscopically guided lumbar puncture with the patient.  We specifically discussed the high technical likelihood of success of the procedure. The patient understood and elected to undergo the procedure.   Standard time-out was employed. Following sterile skin prep and local anesthetic administration consisting of 1 percent lidocaine, a 22 gauge spinal needle was advanced without difficulty into the thecal sac at the at the [L3-4] level.  I elected to use a slight paramedian approach due to the very small amount of interspinous space appreciable on the lumbar MRI.  Clear CSF was returned.  Opening pressure was [not directly measured due to projected difficulty in turning the patient, but did not seem elevated given the sluggish flow of CSF.    11 cc of clear CSF was collected. The needle was subsequently removed and the skin cleansed and bandaged. No immediate complications were observed.    IMPRESSION: [ 1. Technically successful lumbar puncture at the L3-4 level, yielding 11 cc of clear CSF sent to the lab for requested analyses.  ]

## 2017-12-11 NOTE — Care Management Note (Signed)
Case Management Note  Patient Details  Name: [name] MRN: [id number] Date of Birth: [birth year]  Subjective/Objective:       Admitted after falling at home and experiencing ataxia. Pt reports he did not fall but eased to the ground. He lives alone, reports being very ind at home, uses a cane, does not know if he has a RW. He reports he used to drive but this CM  Unsure if he was driving just prior to the fall of if he has stopped driving in the recent past. He has a daughter who lives [place] and is his support person. Dtr is in [place] on [address] and will not be back until Saturday.  Pt unsure at this time what his deificcits are, he does not know if he is able to walk or not, has not been out of the bed.               Action/Plan: Anticipate pt will DC home with [facility] vs SNF depending on PT eval.   Expected Discharge Date:     12/12/2017             Expected Discharge Plan:  [place]  In-House Referral:  NA  Discharge planning Services  CM Consult  Status of Service:  In process, will continue to follow  If discussed at Long Length of Stay Meetings, dates discussed:    Additional Comments:  Sherald Barge, RN 12/11/2017, 11:19 AM

## 2017-12-11 NOTE — Evaluation (Signed)
Occupational Therapy Evaluation Patient Details Name: [name] MRN: [id number] DOB: [birth year] Today's Date: 12/11/2017    History of Present Illness [name] is a 76 y.o. male with medical history significant of atrial fibrillation, falls, HTN, RA, HLD, presented to the ED via EMS after a fall at home.    Patient reports he woke up and went into his bathroom and brushed his teeth. Patient reports he had a fall 2-3 weeks ago in which he fell face down on the sidewalk.  His legs started swelling at that time and Dr. Meda Coffee gave him medication to take care of this.  He does not recall the name of that medication but he says it was over the counter.  For about 2 weeks he reports the swelling stayed about the same and he had to use a cane to walk around.  He voices everything seemed fine until this am.  After brushing his teeth he went back into his bedroom to straighten the sheets and at one point he lost his balance and rolled back.  He fell backwards to his butt and attempted to get up.  He could not and so he crawled to his bedside table.  He attempted to use the table as leverage and still could not get up.  He had a life alert button and he pushed it to get help.    Clinical Impression   Pt received supine in bed, agreeable to OT evaluation. Pt demonstrates ADL completion at baseline this am, with exception of LB dressing which required increased assistance due to difficulty donning socks over compression hose. Pt is aware of balance deficits, balance improved with use of RW during session. Supervision/min guard required for functional mobility tasks. No further OT services required at this time, recommend 24/7 supervision at discharge for safety if this can be arranged with family.     Follow Up Recommendations  No OT follow up;Supervision/Assistance - 24 hour    Equipment Recommendations  None recommended by OT       Precautions / Restrictions Precautions Precautions:  Fall Precaution Comments: 3 falls in past month Restrictions Weight Bearing Restrictions: No      Mobility Bed Mobility Overal bed mobility: Modified Independent                Transfers Overall transfer level: Needs assistance Equipment used: Rolling walker (2 wheeled) Transfers: Sit to/from Stand Sit to Stand: Min guard         General transfer comment: Pt compensates for balance deficits by shuffling back against the bed and pushing to stand with knees against bed        ADL either performed or assessed with clinical judgement   ADL Overall ADL's : Needs assistance/impaired Eating/Feeding: Modified independent;Sitting Eating/Feeding Details (indicate cue type and reason): opening packets without difficulty Grooming: Wash/dry hands;Oral care;Supervision/safety;Standing Grooming Details (indicate cue type and reason): standing without UE support with no LOB         Upper Body Dressing : Minimal assistance;Sitting Upper Body Dressing Details (indicate cue type and reason): Min assist for managing buttons and ties of gown Lower Body Dressing: Moderate assistance;Sitting/lateral leans Lower Body Dressing Details (indicate cue type and reason): Pt able to pull socks over toes, unable to finish threading over compression hose Toilet Transfer: Supervision/safety;RW;Grab bars;Regular Toilet   Toileting- Clothing Manipulation and Hygiene: Supervision/safety;Sit to/from stand       Functional mobility during ADLs: Min guard;Rolling walker       Vision Baseline Vision/History:  Wears glasses Wears Glasses: At all times Patient Visual Report: No change from baseline Vision Assessment?: No apparent visual deficits            Pertinent Vitals/Pain Pain Assessment: No/denies pain     Hand Dominance Right   Extremity/Trunk Assessment Upper Extremity Assessment Upper Extremity Assessment: Generalized weakness(grossly 4-/5)   Lower Extremity Assessment Lower  Extremity Assessment: Defer to PT evaluation   Cervical / Trunk Assessment Cervical / Trunk Assessment: Normal   Communication Communication Communication: HOH   Cognition Arousal/Alertness: Awake/alert Behavior During Therapy: WFL for tasks assessed/performed Overall Cognitive Status: Within Functional Limits for tasks assessed                                                Home Living Family/patient expects to be discharged to:: Private residence Living Arrangements: Alone Available Help at Discharge: Family;Available PRN/intermittently Type of Home: House Home Access: Stairs to enter [employer] of Steps: 3 Entrance Stairs-Rails: Left Home Layout: One level     Bathroom Shower/Tub: Teacher, early years/pre: Standard     Home Equipment: Cane - single point;Shower seat;Grab bars - tub/shower          Prior Functioning/Environment Level of Independence: Independent with assistive device(s)        Comments: Pt using cane since he has been falling. children take care of financials. Still drives        OT Problem List: Decreased activity tolerance;Impaired balance (sitting and/or standing)       AM-PAC PT "6 Clicks" Daily Activity     Outcome Measure Help from another person eating meals?: None Help from another person taking care of personal grooming?: None Help from another person toileting, which includes using toliet, bedpan, or urinal?: A Little Help from another person bathing (including washing, rinsing, drying)?: A Little Help from another person to put on and taking off regular upper body clothing?: None Help from another person to put on and taking off regular lower body clothing?: A Little 6 Click Score: 21   End of Session Equipment Utilized During Treatment: Gait belt;Rolling walker  Activity Tolerance: Patient tolerated treatment well Patient left: in chair;with call bell/phone within reach  OT Visit  Diagnosis: Muscle weakness (generalized) (M62.81);Repeated falls (R29.6)                Time: 1856-3149 OT Time Calculation (min): 35 min Charges:  OT General Charges $OT Visit: 1 Visit OT Evaluation $OT Eval Low Complexity: 1 Low G-Codes: OT G-codes **NOT FOR INPATIENT CLASS** Functional Assessment Tool Used: AM-PAC 6 Clicks Daily Activity Functional Limitation: Self care Self Care Current Status (F0263): At least 20 percent but less than 40 percent impaired, limited or restricted Self Care Goal Status (Z8588): At least 20 percent but less than 40 percent impaired, limited or restricted Self Care Discharge Status [phone]): At least 20 percent but less than 40 percent impaired, limited or restricted    Guadelupe Sabin, OTR/L  [phone] 12/11/2017, 8:22 AM

## 2017-12-11 NOTE — Progress Notes (Signed)
Pt refused lasix and ceflex due at 1800 because he was not feeling well. He stated he would take them later.

## 2017-12-11 NOTE — Evaluation (Signed)
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 male with medical history significant of atrial fibrillation, falls, HTN, RA, HLD, presented to the ED via EMS after a fall at home.    Patient reports he woke up and went into his bathroom and brushed his teeth. Patient reports he had a fall 2-3 weeks ago in which he fell face down on the sidewalk.  His legs started swelling at that time and Dr. Meda Coffee gave him medication to take care of this.  He does not recall the name of that medication but he says it was over the counter.  For about 2 weeks he reports the swelling stayed about the same and he had to use a cane to walk around.  He voices everything seemed fine until this am.  After brushing his teeth he went back into his bedroom to straighten the sheets and at one point he lost his balance and rolled back.  He fell backwards to his butt and attempted to get up.  He could not and so he crawled to his bedside table.  He attempted to use the table as leverage and still could not get up.  He had a life alert button and he pushed it to get help.     Clinical Impression  Patient has most difficulty with sit to stands and initiating gait demonstrating wide base of support short step/stride length with leaning backwards when standing.  Patient will benefit from continued physical therapy in hospital and recommended venue below to increase strength, balance, endurance for safe ADLs and gait.    Follow Up Recommendations SNF;Supervision/Assistance - 24 hour    Equipment Recommendations  Rolling walker with 5" wheels    Recommendations for Other Services       Precautions / Restrictions Precautions Precautions: Fall Precaution Comments: 3 falls in past month Restrictions Weight Bearing Restrictions: No      Mobility  Bed Mobility Overal bed mobility: Modified  Independent                Transfers Overall transfer level: Needs assistance Equipment used: Rolling walker (2 wheeled);Straight cane Transfers: Sit to/from Stand Sit to Stand: Min assist         General transfer comment: difficulty with sit to stand due to near fall backwards  Ambulation/Gait Ambulation/Gait assistance: Min guard Ambulation Distance (Feet): 40 Feet Assistive device: Standard walker;Straight cane Gait Pattern/deviations: Decreased step length - right;Decreased step length - left;Decreased stride length   Gait velocity interpretation: Below normal speed for age/gender General Gait Details: patient very unsteady with wide base of support and short unsteady steps using SPC for approx 10 feet, switched patient to RW and patient stated it felt easier using RW, no loss of balance, limited for gait secondary to c/o fatigue  Stairs            Wheelchair Mobility    Modified Rankin (Stroke Patients Only)       Balance Overall balance assessment: Needs assistance Sitting-balance support: No upper extremity supported;Feet supported Sitting balance-Leahy Scale: Good     Standing balance support: Single extremity supported;During functional activity Standing balance-Leahy Scale: Fair Standing balance comment: fair/poor with SPC, fair/good with RW                             Pertinent Vitals/Pain Pain Assessment:  No/denies pain    Home Living Family/patient expects to be discharged to:: Private residence Living Arrangements: Alone Available Help at Discharge: Family;Available PRN/intermittently Type of Home: House Home Access: Stairs to enter Entrance Stairs-Rails: Left Entrance Stairs-Number of Steps: 3 Home Layout: One level Home Equipment: Cane - single point;Shower seat;Grab bars - tub/shower      Prior Function Level of Independence: Independent with assistive device(s)         Comments: occasional use of SPC at least 40%  of time per patient     Hand Dominance   Dominant Hand: Right    Extremity/Trunk Assessment   Upper Extremity Assessment Upper Extremity Assessment: Generalized weakness    Lower Extremity Assessment Lower Extremity Assessment: Generalized weakness    Cervical / Trunk Assessment Cervical / Trunk Assessment: Normal  Communication   Communication: HOH  Cognition Arousal/Alertness: Awake/alert Behavior During Therapy: WFL for tasks assessed/performed Overall Cognitive Status: Within Functional Limits for tasks assessed                                        General Comments      Exercises     Assessment/Plan    PT Assessment Patient needs continued PT services  PT Problem List Decreased strength;Decreased activity tolerance;Decreased balance;Decreased mobility       PT Treatment Interventions Gait training;Stair training;Functional mobility training;Therapeutic activities;Therapeutic exercise;Patient/family education    PT Goals (Current goals can be found in the Care Plan section)  Acute Rehab PT Goals Patient Stated Goal: return home  PT Goal Formulation: With patient Time For Goal Achievement: 12/15/17 Potential to Achieve Goals: Good    Frequency Min 3X/week   Barriers to discharge        Co-evaluation               AM-PAC PT "6 Clicks" Daily Activity  Outcome Measure Difficulty turning over in bed (including adjusting bedclothes, sheets and blankets)?: None Difficulty moving from lying on back to sitting on the side of the bed? : None Difficulty sitting down on and standing up from a chair with arms (e.g., wheelchair, bedside commode, etc,.)?: A Little Help needed moving to and from a bed to chair (including a wheelchair)?: A Little Help needed walking in hospital room?: A Little Help needed climbing 3-5 steps with a railing? : A Little 6 Click Score: 20    End of Session   Activity Tolerance: Patient tolerated treatment  well;Patient limited by fatigue Patient left: in bed;with call bell/phone within reach(seated at bedside to eat lunch) Nurse Communication: Mobility status PT Visit Diagnosis: Unsteadiness on feet (R26.81);Other abnormalities of gait and mobility (R26.89);Muscle weakness (generalized) (M62.81)    Time: 2119-4174 PT Time Calculation (min) (ACUTE ONLY): 28 min   Charges:   PT Evaluation $PT Eval Moderate Complexity: 1 Mod PT Treatments $Therapeutic Activity: 8-22 mins   PT G Codes:   PT G-Codes **NOT FOR INPATIENT CLASS** Functional Assessment Tool Used: AM-PAC 6 Clicks Basic Mobility Functional Limitation: Mobility: Walking and moving around Mobility: Walking and Moving Around Current Status (Y8144): At least 20 percent but less than 40 percent impaired, limited or restricted Mobility: Walking and Moving Around Goal Status [phone]): At least 20 percent but less than 40 percent impaired, limited or restricted Mobility: Walking and Moving Around Discharge Status [phone]): At least 20 percent but less than 40 percent impaired, limited or restricted  3:49 PM, 12/11/17 Lonell Grandchild, MPT Physical Therapist with [facility] 336 [phone] office [phone] mobile phone

## 2017-12-11 NOTE — Plan of Care (Signed)
Discussed living arrangements and POC with patient.

## 2017-12-11 NOTE — Consult Note (Signed)
[place] A. Merlene Laughter, MD     [email or web address]          [name] is an 76 y.o. male.   ASSESSMENT/PLAN: 1. Subacute multifactorial gait impairment: The presentation is nonspecific with no clear defining syndrome or etiology. It is likely multifactorial including rheumatoid arthritis, osteoarthritis, aging and possibly medication effect. 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 While these can cause problems with balance and gait, he does not appears to be overtly myelopathic. Statin induced myopathy is a possibility but I think this is less likely given the clinical picture. The exam does not support reported severe neuropathy. Repeat orthostatics will be obtained. CPK will also be obtained. The patient has been started on high-dose Solu-Medrol which is reasonable although I would recommend no more than 2 doses since this is more of an empiric trial without clear underlying diagnosis for high-dose steroids. Physical and occupational therapy is recommended.  2. Chronic atrial fibrillation with the the anticoagulation be held currently due to the patient's frequent falls.      Patient is a 76 year old white male who presents with balance problems and frequent falling. The patient tells me that he's had about 10 falls over the last year as far as he can recall. No clear loss of consciousness is reported. He tells me that he fell about 2 weeks ago and since then he has not returned to baseline in regards to his balance and function. The patient denies any bladder or bowel incontinence. He denies focal numbness or tingling. He denies any numbness or tingling involving the feet, legs or upper extremities. He just underwent spinal tap and is complaining about having to lay flat in bed. He is otherwise seen to be doing well. The review systems otherwise negative.   GENERAL: He is in no acute distress. He is pleasant and cooperates with evaluation.  HEENT: This is  essentially unrevealing with neck supple and no other obvious trauma.  ABDOMEN: soft  EXTREMITIES: There is significant arthritic changes and some swelling involving the hands with primarily the metacarpophalangeal joints and some ulnar deviation. There is a marked arthritic changes of the knees and the lower extremities. 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 There is mild swelling involving the feet and distal ankles.  BACK: Normal  SKIN: Normal by inspection.    MENTAL STATUS: He is awake and the responsive. He is lucid and coherent although it has difficulties following commands because of hearing impairment.   CRANIAL NERVES: Pupils are equal, round and reactive to light and accomodation; extra ocular movements are full, there is no significant nystagmus; visual fields are full; upper and lower facial muscles are normal in strength and symmetric, there is no flattening of the nasolabial folds; tongue is midline; uvula is midline; shoulder elevation is normal.  MOTOR: Upper extremities show Normal tone, bulk and strength; no pronator drift. Left hip flexion is 4 minus and dorsiflexion 4. Right hip flexion is 4 in dorsiflexion 4. Bulk and tone are normal.  COORDINATION: Left finger to nose is normal, right finger to nose is normal, No rest tremor; no intention tremor; no postural tremor; no bradykinesia.  REFLEXES: Deep tendon reflexes are symmetrical and normal involving the upper extremities; left knee is diminished right knee slightly brisk. Babinski reflexes are flexor bilaterally.   SENSATION: Response to pain bilaterally.        Blood pressure (!) 107/52, pulse 76, temperature 98.3 F (36.8 C), temperature source Oral,  resp. rate 18, height 5' 10"  (1.778 m), weight 179 lb (81.2 kg), SpO2 97 %.  Past Medical History:  Diagnosis Date  . A-fib ([place])   . Arthritis    osteoarthritis  . Basal cell carcinoma (BCC) 08/29/2017  . Cataract   . Frequent falls    . Glaucoma   . High cholesterol   . Hypertension   . MI (myocardial infarction) ([place])   . Rheumatoid arthritis ([place])   . Thyroid disease     Past Surgical History:  Procedure Laterality Date  . CORONARY ANGIOPLASTY WITH STENT PLACEMENT    . JOINT REPLACEMENT     left knee  . REPLACEMENT TOTAL KNEE Left     Family History  Problem Relation Age of Onset  . Heart attack Father 66  . Early death Father   . Alzheimer's disease Mother 41  . Early death Sister        MVA  . Hyperlipidemia Brother   . Heart disease Brother     Social History:  reports that he quit smoking about 41 years ago. His smoking use included cigarettes. He quit after 15.00 years of use. he has never used smokeless tobacco. He reports that he does not drink alcohol or use drugs.  Allergies: No Known Allergies  Medications: Prior to Admission medications   Medication Sig Start Date End Date Taking? Authorizing Provider  folic acid (FOLVITE) 1 MG tablet Take 1 tablet (1 mg total) by mouth daily. 04/30/17  Yes Raylene Everts, MD  furosemide (LASIX) 40 MG tablet Take 1 tablet (40 mg total) by mouth daily as needed. 08/16/17 12/10/17 Yes Lendon Colonel, NP  inFLIXimab (REMICADE) 100 MG injection Inject into the vein. 02/24/15  Yes [provider]  levothyroxine (SYNTHROID, LEVOTHROID) 75 MCG tablet Take 1 tablet (75 mcg total) by mouth daily before breakfast. 04/30/17  Yes Raylene Everts, MD  lisinopril (PRINIVIL,ZESTRIL) 2.5 MG tablet Take 1 tablet (2.5 mg total) by mouth daily. 08/16/17 12/10/17 Yes Lendon Colonel, NP  methotrexate 2.5 MG tablet Take 15 mg by mouth every Sunday. 6 tablets on Sunday   Yes [provider]  rivaroxaban (XARELTO) 20 MG TABS tablet Take 1 tablet (20 mg total) by mouth daily with supper. Patient taking differently: Take 20 mg by mouth every morning.  04/16/17  Yes Herminio Commons, MD  simvastatin (ZOCOR) 40 MG tablet Take 1 tablet (40 mg total) by  mouth daily. 04/30/17  Yes Raylene Everts, MD  acetaminophen (TYLENOL) 500 MG tablet Take 1,000 mg by mouth every 6 (six) hours as needed.    [provider]  neomycin-polymyxin-hydrocortisone (CORTISPORIN) OTIC solution Place 3 drops into both ears 4 (four) times daily. Patient not taking: Reported on 12/10/2017 08/06/17   Raylene Everts, MD  polycarbophil (FIBERCON) 625 MG tablet Take 625 mg by mouth at bedtime.    [provider]  potassium chloride SA (K-DUR,KLOR-CON) 20 MEQ tablet Tale Potassium 20 meq daily for 5 days and then daily as NEEDED for leg swelling Patient taking differently: Take 20 mEq by mouth daily as needed. as NEEDED for leg swelling 04/16/17   Herminio Commons, MD  timolol (TIMOPTIC) 0.5 % ophthalmic solution INSTILL ONE DROP INTO EACH EYE TWICE DAILY 12/15/13   [provider]    Scheduled Meds: . cephALEXin  500 mg Oral Q6H  . furosemide  40 mg Intravenous Daily   Continuous Infusions: . heparin     PRN Meds:.acetaminophen **OR**  acetaminophen, ondansetron **OR** ondansetron (ZOFRAN) IV, polyethylene glycol     Results for orders placed or performed during the hospital encounter of 12/10/17 (from the past 48 hour(s))  Basic metabolic panel     Status: Abnormal   Collection Time: 12/10/17  9:48 AM  Result Value Ref Range   Sodium 133 (L) 135 - 145 mmol/L   Potassium 4.3 3.5 - 5.1 mmol/L   Chloride 100 (L) 101 - 111 mmol/L   CO2 28 22 - 32 mmol/L   Glucose, Bld 115 (H) 65 - 99 mg/dL   BUN 12 6 - 20 mg/dL   Creatinine, Ser 0.77 0.61 - 1.24 mg/dL   Calcium 8.2 (L) 8.9 - 10.3 mg/dL   GFR calc non Af Amer >60 >60 mL/min   GFR calc Af Amer >60 >60 mL/min    Comment: (NOTE) The eGFR has been calculated using the CKD EPI equation. This calculation has not been validated in all clinical situations. eGFR's persistently <60 mL/min signify possible Chronic Kidney Disease.    Anion gap 5 5 - 15  CBC with Differential/Platelet      Status: Abnormal   Collection Time: 12/10/17  9:48 AM  Result Value Ref Range   WBC 10.5 4.0 - 10.5 K/uL   RBC 3.09 (L) 4.22 - 5.81 MIL/uL   Hemoglobin 9.9 (L) 13.0 - 17.0 g/dL   HCT 31.2 (L) 39.0 - 52.0 %   MCV 101.0 (H) 78.0 - 100.0 fL   MCH 32.0 26.0 - 34.0 pg   MCHC 31.7 30.0 - 36.0 g/dL   RDW 15.2 11.5 - 15.5 %   Platelets 572 (H) 150 - 400 K/uL   Neutrophils Relative % 77 %   Neutro Abs 8.1 (H) 1.7 - 7.7 K/uL   Lymphocytes Relative 9 %   Lymphs Abs 1.0 0.7 - 4.0 K/uL   Monocytes Relative 14 %   Monocytes Absolute 1.4 (H) 0.1 - 1.0 K/uL   Eosinophils Relative 0 %   Eosinophils Absolute 0.0 0.0 - 0.7 K/uL   Basophils Relative 0 %   Basophils Absolute 0.0 0.0 - 0.1 K/uL  Lactic acid, plasma     Status: None   Collection Time: 12/10/17  9:48 AM  Result Value Ref Range   Lactic Acid, Venous 1.6 0.5 - 1.9 mmol/L  Culture, blood (routine x 2)     Status: None (Preliminary result)   Collection Time: 12/10/17  9:49 AM  Result Value Ref Range   Specimen Description BLOOD RIGHT ARM    Special Requests      BOTTLES DRAWN AEROBIC AND ANAEROBIC Blood Culture adequate volume   Culture NO GROWTH 1 DAY    Report Status PENDING   Brain natriuretic peptide     Status: Abnormal   Collection Time: 12/10/17  9:57 AM  Result Value Ref Range   B Natriuretic Peptide 258.0 (H) 0.0 - 100.0 pg/mL  Iron and TIBC     Status: Abnormal   Collection Time: 12/10/17  9:57 AM  Result Value Ref Range   Iron 25 (L) 45 - 182 ug/dL   TIBC 210 (L) 250 - 450 ug/dL   Saturation Ratios 12 (L) 17.9 - 39.5 %   UIBC 185 ug/dL    Comment: Performed at [facility], [address], [place], [place] [address]  Ferritin     Status: Abnormal   Collection Time: 12/10/17  9:57 AM  Result Value Ref Range   Ferritin 577 (H) 24 - 336 ng/mL  Comment: Performed at [facility], [place] [address], [place], [place] [address]  Urinalysis, Complete w Microscopic     Status: Abnormal   Collection Time:  12/10/17  4:38 PM  Result Value Ref Range   Color, Urine AMBER (A) YELLOW    Comment: BIOCHEMICALS MAY BE AFFECTED BY COLOR   APPearance HAZY (A) CLEAR   Specific Gravity, Urine 1.019 1.005 - 1.030   pH 5.0 5.0 - 8.0   Glucose, UA NEGATIVE NEGATIVE mg/dL   Hgb urine dipstick NEGATIVE NEGATIVE   Bilirubin Urine NEGATIVE NEGATIVE   Ketones, ur NEGATIVE NEGATIVE mg/dL   Protein, ur NEGATIVE NEGATIVE mg/dL   Nitrite NEGATIVE NEGATIVE   Leukocytes, UA NEGATIVE NEGATIVE   RBC / HPF 0-5 0 - 5 RBC/hpf   WBC, UA 0-5 0 - 5 WBC/hpf   Bacteria, UA NONE SEEN NONE SEEN   Squamous Epithelial / LPF 0-5 (A) NONE SEEN   Mucus PRESENT   Troponin I     Status: None   Collection Time: 12/10/17  5:54 PM  Result Value Ref Range   Troponin I <0.03 <0.03 ng/mL  Troponin I     Status: None   Collection Time: 12/10/17 10:12 PM  Result Value Ref Range   Troponin I <0.03 <0.03 ng/mL  Troponin I     Status: None   Collection Time: 12/11/17  4:22 AM  Result Value Ref Range   Troponin I <0.03 <0.03 ng/mL  APTT     Status: Abnormal   Collection Time: 12/11/17  1:50 PM  Result Value Ref Range   aPTT 38 (H) 24 - 36 seconds    Comment:        IF BASELINE aPTT IS ELEVATED, SUGGEST PATIENT RISK ASSESSMENT BE USED TO DETERMINE APPROPRIATE ANTICOAGULANT THERAPY.   Heparin level (unfractionated)     Status: Abnormal   Collection Time: 12/11/17  1:50 PM  Result Value Ref Range   Heparin Unfractionated 0.12 (L) 0.30 - 0.70 IU/mL    Comment:        IF HEPARIN RESULTS ARE BELOW EXPECTED VALUES, AND PATIENT DOSAGE HAS BEEN CONFIRMED, SUGGEST FOLLOW UP TESTING OF ANTITHROMBIN III LEVELS.   Glucose, CSF     Status: None   Collection Time: 12/11/17  3:40 PM  Result Value Ref Range   Glucose, CSF 60 40 - 70 mg/dL  Protein, CSF     Status: Abnormal   Collection Time: 12/11/17  3:40 PM  Result Value Ref Range   Total  Protein, CSF 50 (H) 15 - 45 mg/dL  CSF cell count with differential     Status: Abnormal    Collection Time: 12/11/17  3:40 PM  Result Value Ref Range   Tube # 4    Color, CSF CLEAR (A) COLORLESS   Appearance, CSF COLORLESS (A) CLEAR   Supernatant CLEAR    RBC Count, CSF 0 0 /cu mm   WBC, CSF <10 (H) 0 - 5 /cu mm    Comment: CORRECTED ON 12/18 AT 1721: PREVIOUSLY REPORTED AS 0   Segmented Neutrophils-CSF TOO FEW TO COUNT, SMEAR AVAILABLE FOR REVIEW 0 - 6 %   Lymphs, CSF TOO FEW TO COUNT, SMEAR AVAILABLE FOR REVIEW 40 - 80 %   Monocyte-Macrophage-Spinal Fluid TOO FEW TO COUNT, SMEAR AVAILABLE FOR REVIEW 15 - 45 %   Eosinophils, CSF TOO FEW TO COUNT, SMEAR AVAILABLE FOR REVIEW 0 - 1 %    Studies/Results:  Prior labs early this year TSH 2.3,  vitamin B12 381 and vitamin-D 38.   Thoracic spine MRI: FINDINGS: MRI THORACIC SPINE FINDINGS  Alignment:  Normal  Vertebrae: Mild chronic fracture superior endplate of I77. No acute fracture or mass.  Cord: Thoracic cord syrinx. The largest syrinx measures 2.5 mm at T6. Smaller syrinx extends down to T8. Small syrinx again at T12. No cord mass or enhancing lesion identified.  Paraspinal and other soft tissues: Negative  Disc levels:  No significant spinal stenosis. Mild facet degeneration in the mid and lower thoracic spine. No focal disc protrusion. 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 No underlying cord compression spinal stenosis or mass lesion identified  Mild thoracic disc and facet degeneration.      Lumbar spine MRI: FINDINGS: Segmentation: Transitional anatomy suspected with lumbarized S1 level based on the appearance of the April lumbar radiographs. Full size ribs designated at T12 by this numbering system. Correlation with radiographs is recommended prior to any operative intervention.  Alignment: Mild grade 1 anterolisthesis at both L5-S1 and S1-S2 with exaggerated lower lumbar lordosis. Mild retrolisthesis of L2 on  L3.  Vertebrae: Mild T12 superior endplate deformity with minimal associated superior endplate marrow edema (series 5, image 13). Normal marrow signal elsewhere in the T12 vertebral body.  Chronic degenerative appearing endplate marrow signal changes at L2-L3, and also anteriorly at L1-L2. Background bone marrow signal elsewhere within normal limits. No other No marrow edema or evidence of acute osseous abnormality. Intact visible sacrum and SI joints.  Conus medullaris and cauda equina: Conus extends to the L1-L2 level. There is subtle patchy T2 and STIR hyperintensity within the lower thoracic spinal cord seen at the T11-T12 level on both sagittal series 3, image 10 and axial series 6, image 1. Superimposed more central linear and intense signal abnormality in in the cord posterior to the T12 vertebral body could be superimposed prominence of the central spinal canal. Persistent central cord T2 hyperintensity continues to the conus as seen on series 6, image 4. No cord expansion. The cauda equina nerve roots appear normal.  Paraspinal and other soft tissues: Generalized bilateral patchy abnormal increased STIR signal in the bilateral posterior erector spinae muscles as seen on series 5, image 18 (on the left) and image 1 (on the right) similar indistinct bilateral medial psoas muscle signal abnormality as seen on series 5, image 17. The muscles do not appear expanded. No intramuscular fluid collection.  Negative visualized abdominal viscera. Diverticulosis of the colon in the lower abdomen and pelvis.  Disc levels:  Age congruent lumbar spine degeneration at most levels. There is mild retrolisthesis at L2-L3 with advanced disc and endplate degeneration eccentric to the left, but no spinal stenosis. There is mild left lateral recess and left foraminal stenosis at that level.  There is moderate and severe L4-L5, L5-S1 and S1-S2 facet degeneration. Bilateral degenerative  L5-S1 facet joint fluid. These changes contribute to mild bilateral L4 neural foraminal stenosis, and mild L5-S1 lateral recess and foraminal stenosis. No significant spinal stenosis.  IMPRESSION: 1. Patchy abnormal signal in the visible lower spinal cord and conus is nonspecific. Are there symptoms of spinal cord infarct? There is no cord expansion to suggest tumor. The differential diagnosis would include transverse myelitis. Thoracic spine MRI without and with contrast may be valuable to further evaluate spinal cord. 2. Diffuse abnormal paraspinal muscle (bilateral psoas and erector spinae muscle) compatible with nonspecific myositis such as due to mild  muscle edema or denervation changes. No intramuscular fluid collection. Perhaps this is related to #1. 3. No acute osseous abnormality. Exaggerated lumbar lordosis with multilevel mild spondylolisthesis and transitional lumbosacral anatomy. Chronic disc and endplate degeneration in the upper lumbar spine, and chronic posterior element degeneration in the lower lumbar spine but no spinal stenosis. 4. Diverticulosis of the large bowel in the lower abdomen and pelvis.        Brain MRI: FINDINGS: Brain: No acute infarction, hemorrhage, hydrocephalus, extra-axial collection or mass lesion. Generalized cortical atrophy and mild chronic microvascular ischemic change in the cerebral white matter.  Vascular: Major flow voids are preserved.  Skull and upper cervical spine: Negative for marrow lesion  Sinuses/Orbits: Chronic right maxillary sinusitis with inspissated secretions and atelectasis. Chronic mastoid opacification that is improved from CT 09/05/2017  IMPRESSION: 1. Senescent changes without acute finding including infarct. 2. Chronic right maxillary sinusitis. 3. Mastoid opacification that is improved from CT 09/05/2017.       All the patient's scans are reviewed in person.  The brain MRI shows mild atrophy  and mild chronic microscopic ischemic changes.  Nothing acute is seen.  There is a questionable lacunar infarct involving the right basal ganglia.  The lumbar spine shows multilevel disc degeneration and facet disease.  Of most concern is L1-L2 where there is posterior retrolisthesis of L1 on L2 causing moderate spinal canal it is disease/stenosis.  This level associated with marked arthritic changes.  The thoracic spine MRI shows syrinx and at two levels of moderate size.  A scout of the cervical spine shows no significant disease but this was only T1.       Kaleen Rochette A. Merlene Laughter, M.D.  Diplomate, Tax adviser of Psychiatry and Neurology ( Neurology). 12/11/2017, 6:46 PM

## 2017-12-11 NOTE — Care Management Obs Status (Signed)
[facility] NOTIFICATION   Patient Details  Name: [name] MRN: [id number] Date of Birth: 04-18-1941   Medicare Observation Status Notification Given:  Yes    Sherald Barge, RN 12/11/2017, 11:17 AM

## 2017-12-11 NOTE — Progress Notes (Signed)
PROGRESS NOTE    [name]  ZOX:[id number] DOB: [birth year] DOA: 12/10/2017 PCP: Raylene Everts, MD    Brief Narrative:  [name] is a 76 y.o. male with medical history significant of atrial fibrillation, falls, HTN, RA, HLD, presented to the ED via EMS after a fall at home.    Patient reports he woke up and went into his bathroom and brushed his teeth. Patient reports he had a fall 2-3 weeks ago in which he fell face down on the sidewalk.  His legs started swelling at that time and Dr. Meda Coffee gave him medication to take care of this.  He does not recall the name of that medication but he says it was over the counter.  For about 2 weeks he reports the swelling stayed about the same and he had to use a cane to walk around.  He voices everything seemed fine until this am.  After brushing his teeth he went back into his bedroom to straighten the sheets and at one point he lost his balance and rolled back.  He fell backwards to his butt and attempted to get up.  He could not and so he crawled to his bedside table.  He attempted to use the table as leverage and still could not get up.  He had a life alert button and he pushed it to get help.   Patient reports he has had some incontinence today but then states he has been unable to urinate since this morning.  He denies loss of control of his bowels.  Patient states he lives alone but lives 5 minutes from his daughter who is currently in [place].  When reviewing patient's charts it appears that patient was seen on 11/27/2017 by his primary care physician who noted he was too weak to get up from a seated position as well as his unsteadiness with his gait.  It appears in her assessment and plan I discussed a referral to neurology as well as physical therapy even though he had just recently completed a course of physical therapy.  Dr. Meda Coffee also notes that patient sees a rheumatologist and she was going to call the rheumatologist to gain  insight into what could possibly be going on.  Patient denies that he has been unable to get up from a seated position and that he has been using a cane to walk.  He says that prior to the fall that he told me about previously he had been walking without concerns.  He denies fevers, chills, chest pain, chest pressure, lightheadedness, dizziness, shortness of breath, nausea, vomiting, diarrhea, constipation.  ED Course: Patient was seen by EDP.  Initial vital signs showed a respiratory rate of 14, pulse rate 55, blood pressure 122/59, and oxygen of 100% on room air.  Patient underwent CT of the head as well as MRI of the brain which showed no findings to explain his weakness.  He was unable to ambulate in the emergency department and was found to have right lower extremity erythema with concern for cellulitis.  Patient was started on IV vancomycin and TR[facility] H was asked to admit for cellulitis and further evaluation of patient's ataxia.     Assessment & Plan:   Active Problems:   A-fib (HCC)   Essential hypertension   Hypothyroid   HLD (hyperlipidemia)   Ataxia   Unsteady gait   Cellulitis of right lower extremity   Ataxia and weakness -MRI of lumbar spine showing Patchy abnormal signal in  the visible lower spinal cord and conus is nonspecific. Are there symptoms of spinal cord infarct? There is no cord expansion to suggest tumor. The differential diagnosis would include transverse myelitis. Thoracic spine MRI without and with contrast may be valuable to further evaluate spinal cord. Diffuse abnormal paraspinal muscle (bilateral psoas and erector spinae muscle) compatible with nonspecific myositis such as due to mild muscle edema or denervation changes. No intramuscular fluid collection. Perhaps this is related to #1. No acute osseous abnormality. Exaggerated lumbar lordosis with multilevel mild spondylolisthesis and transitional lumbosacral anatomy. Chronic disc and endplate degeneration in the  upper lumbar spine, and chronic posterior element degeneration in the lower lumbar spine but no spinal stenosis. Diverticulosis of the large bowel in the lower abdomen and Pelvis. - called neurology at [facility] for review of MRI findings-we will get MRI of the thoracic spine with and without contrast -Lumbar puncture with IgE assay to further evaluate for possible autoimmunity - If lumbar puncture is delayed will start IV steroids Solu-Medrol thousand milligrams IV daily for 3-5 days (needs steroids regardless but will start prior to lumbar puncture if lumbar puncture cannot be performed today.  Discussed with Dr.Lindzen of neurology at [facility].  At this time benefits outweigh risks of high-dose steroids) - blood cultures showing no growth for 1 day - troponins x 3 negative - consult neurology - will order MRI thoracic spine with and without contrast   Atrial fibrillation - telemetry -Heparin  HTN - continue lisinopril  Lower extremity edema - continue lasix and potassium Will increase Lasix given edema  Cellulitis - will transition to IV - nonpurulent  HLD - continue statin  Anemia - iron panel ordered     DVT prophylaxis: patient on xarelto at home but holding this Code Status: DNR Family Communication: Will call patient's daughter to update her Disposition Plan: At least 3 subsequent days in the hospital for high-dose IV steroids given concern for transverse myelitis   Consultants:   Neurology  PT  OT  Procedures:   None  Antimicrobials:   None    Subjective: Patient seen and evaluated this am.  He was about to walk with physical therapy.  He voices he still has weakness in his lower extremities.  He understands the findings on the MRI and wishes to go forward with another MRI and lumbar puncture.  Objective: Vitals:   12/10/17 1652 12/10/17 2002 12/10/17 2054 12/11/17 0605  BP: (!) 126/49  (!) 125/58 (!) 107/52  Pulse: (!) 57  65 76    Resp:   18 18  Temp: 98.6 F (37 C)  98.7 F (37.1 C) 98.3 F (36.8 C)  TempSrc: Oral  Oral Oral  SpO2: 100% 98% 100% 97%  Weight: 81.2 kg (179 lb)     Height: 5\' 10"  (1.778 m)       Intake/Output Summary (Last 24 hours) at 12/11/2017 1130 Last data filed at 12/10/2017 2215 Gross per 24 hour  Intake 740 ml  Output -  Net 740 ml   Filed Weights   12/10/17 0918 12/10/17 1652  Weight: 79.4 kg (175 lb) 81.2 kg (179 lb)    Examination:  General exam: Appears calm and comfortable  Respiratory system: Clear to auscultation. Respiratory effort normal. Cardiovascular system: S1 & S2 heard, irregularly irregular.  No JVD, murmurs, rubs, gallops or clicks.  2+ edema to the knees bilaterally Gastrointestinal system: Abdomen is nondistended, soft and nontender. No organomegaly or masses felt. Normal bowel sounds heard. Central  nervous system: Alert and oriented.  Cranial nerves II through XII are grossly intact Extremities: She is able to plantar and dorsiflex as well as flex and extend at the knee bilaterally.  Flexion at the hip appreciated bilaterally Skin: Erythema of the right lower leg still appreciated Psychiatry: Judgement and insight appear normal. Mood & affect appropriate.     Data Reviewed: I have personally reviewed following labs and imaging studies  CBC: Recent Labs  Lab 12/10/17 0948  WBC 10.5  NEUTROABS 8.1*  HGB 9.9*  HCT 31.2*  MCV 101.0*  PLT 811*   Basic Metabolic Panel: Recent Labs  Lab 12/10/17 0948  NA 133*  K 4.3  CL 100*  CO2 28  GLUCOSE 115*  BUN 12  CREATININE 0.77  CALCIUM 8.2*   GFR: Estimated Creatinine Clearance: 81.1 mL/min (by C-G formula based on SCr of 0.77 mg/dL). Liver Function Tests: No results for input(s): AST, ALT, ALKPHOS, BILITOT, PROT, ALBUMIN in the last 168 hours. No results for input(s): LIPASE, AMYLASE in the last 168 hours. No results for input(s): AMMONIA in the last 168 hours. Coagulation Profile: No results  for input(s): INR, PROTIME in the last 168 hours. Cardiac Enzymes: Recent Labs  Lab 12/10/17 1754 12/10/17 2212 12/11/17 0422  TROPONINI <0.03 <0.03 <0.03   BNP (last 3 results) No results for input(s): PROBNP in the last 8760 hours. HbA1C: No results for input(s): HGBA1C in the last 72 hours. CBG: No results for input(s): GLUCAP in the last 168 hours. Lipid Profile: No results for input(s): CHOL, HDL, LDLCALC, TRIG, CHOLHDL, LDLDIRECT in the last 72 hours. Thyroid Function Tests: No results for input(s): TSH, T4TOTAL, FREET4, T3FREE, THYROIDAB in the last 72 hours. Anemia Panel: Recent Labs    12/10/17 0957  FERRITIN 577*  TIBC 210*  IRON 25*   Sepsis Labs: Recent Labs  Lab 12/10/17 0948  LATICACIDVEN 1.6    Recent Results (from the past 240 hour(s))  Culture, blood (routine x 2)     Status: None (Preliminary result)   Collection Time: 12/10/17  9:49 AM  Result Value Ref Range Status   Specimen Description BLOOD RIGHT ARM  Final   Special Requests   Final    BOTTLES DRAWN AEROBIC AND ANAEROBIC Blood Culture adequate volume   Culture NO GROWTH 1 DAY  Final   Report Status PENDING  Incomplete         Radiology Studies: Dg Chest 2 View  Result Date: 12/10/2017 CLINICAL DATA:  Weakness for 2-4 weeks, worsening. EXAM: CHEST  2 VIEW COMPARISON:  None. FINDINGS: There is cardiomegaly without edema. Lungs are clear. No pneumothorax or pleural effusion. Aortic atherosclerosis is noted. No acute bony abnormality. Remote fracture of the distal right clavicle is noted. IMPRESSION: Cardiomegaly without acute disease. Atherosclerosis. Electronically Signed   By: Inge Rise M.D.   On: 12/10/2017 10:59   Ct Head Wo Contrast  Result Date: 12/10/2017 CLINICAL DATA:  The patient suffered an episode of dizziness with a fall this morning. EXAM: CT HEAD WITHOUT CONTRAST TECHNIQUE: Contiguous axial images were obtained from the base of the skull through the vertex without  intravenous contrast. COMPARISON:  Head CT scan 05/18/2017. FINDINGS: Brain: There is mild atrophy and some chronic microvascular ischemic change. No evidence of acute abnormality including hemorrhage, infarct, mass lesion, mass effect, midline shift or abnormal extra-axial fluid collection. No hydrocephalus or pneumocephalus. Vascular: Atherosclerosis noted. Skull: Intact. Sinuses/Orbits: There is partial visualization of marked mucosal thickening in the right maxillary sinus  with associated wall thickening consistent with chronic change. This finding was present on the prior exam. Other: None. IMPRESSION: No acute abnormality. Mild atrophy and chronic microvascular change. Chronic right maxillary sinus disease. Electronically Signed   By: Inge Rise M.D.   On: 12/10/2017 11:16   Mr Brain Wo Contrast (neuro Protocol)  Result Date: 12/10/2017 CLINICAL DATA:  Ataxia with stroke suspected. EXAM: MRI HEAD WITHOUT CONTRAST TECHNIQUE: Multiplanar, multiecho pulse sequences of the brain and surrounding structures were obtained without intravenous contrast. COMPARISON:  Head CT from earlier today FINDINGS: Brain: No acute infarction, hemorrhage, hydrocephalus, extra-axial collection or mass lesion. Generalized cortical atrophy and mild chronic microvascular ischemic change in the cerebral white matter. Vascular: Major flow voids are preserved. Skull and upper cervical spine: Negative for marrow lesion Sinuses/Orbits: Chronic right maxillary sinusitis with inspissated secretions and atelectasis. Chronic mastoid opacification that is improved from CT 09/05/2017 IMPRESSION: 1. Senescent changes without acute finding including infarct. 2. Chronic right maxillary sinusitis. 3. Mastoid opacification that is improved from CT 09/05/2017. Electronically Signed   By: Monte Fantasia M.D.   On: 12/10/2017 12:20   Mr Lumbar Spine Wo Contrast  Result Date: 12/11/2017 CLINICAL DATA:  76 year old male with progressive  weakness for 2-4 weeks. Fall in April. EXAM: MRI LUMBAR SPINE WITHOUT CONTRAST TECHNIQUE: Multiplanar, multisequence MR imaging of the lumbar spine was performed. No intravenous contrast was administered. COMPARISON:  Lumbar radiographs 04/01/2017. FINDINGS: Segmentation: Transitional anatomy suspected with lumbarized S1 level based on the appearance of the April lumbar radiographs. Full size ribs designated at T12 by this numbering system. Correlation with radiographs is recommended prior to any operative intervention. Alignment: Mild grade 1 anterolisthesis at both L5-S1 and S1-S2 with exaggerated lower lumbar lordosis. Mild retrolisthesis of L2 on L3. Vertebrae: Mild T12 superior endplate deformity with minimal associated superior endplate marrow edema (series 5, image 13). Normal marrow signal elsewhere in the T12 vertebral body. Chronic degenerative appearing endplate marrow signal changes at L2-L3, and also anteriorly at L1-L2. Background bone marrow signal elsewhere within normal limits. No other No marrow edema or evidence of acute osseous abnormality. Intact visible sacrum and SI joints. Conus medullaris and cauda equina: Conus extends to the L1-L2 level. There is subtle patchy T2 and STIR hyperintensity within the lower thoracic spinal cord seen at the T11-T12 level on both sagittal series 3, image 10 and axial series 6, image 1. Superimposed more central linear and intense signal abnormality in in the cord posterior to the T12 vertebral body could be superimposed prominence of the central spinal canal. Persistent central cord T2 hyperintensity continues to the conus as seen on series 6, image 4. No cord expansion. The cauda equina nerve roots appear normal. Paraspinal and other soft tissues: Generalized bilateral patchy abnormal increased STIR signal in the bilateral posterior erector spinae muscles as seen on series 5, image 18 (on the left) and image 1 (on the right) similar indistinct bilateral medial  psoas muscle signal abnormality as seen on series 5, image 17. The muscles do not appear expanded. No intramuscular fluid collection. Negative visualized abdominal viscera. Diverticulosis of the colon in the lower abdomen and pelvis. Disc levels: Age congruent lumbar spine degeneration at most levels. There is mild retrolisthesis at L2-L3 with advanced disc and endplate degeneration eccentric to the left, but no spinal stenosis. There is mild left lateral recess and left foraminal stenosis at that level. There is moderate and severe L4-L5, L5-S1 and S1-S2 facet degeneration. Bilateral degenerative L5-S1 facet joint fluid. These  changes contribute to mild bilateral L4 neural foraminal stenosis, and mild L5-S1 lateral recess and foraminal stenosis. No significant spinal stenosis. IMPRESSION: 1. Patchy abnormal signal in the visible lower spinal cord and conus is nonspecific. Are there symptoms of spinal cord infarct? There is no cord expansion to suggest tumor. The differential diagnosis would include transverse myelitis. Thoracic spine MRI without and with contrast may be valuable to further evaluate spinal cord. 2. Diffuse abnormal paraspinal muscle (bilateral psoas and erector spinae muscle) compatible with nonspecific myositis such as due to mild muscle edema or denervation changes. No intramuscular fluid collection. Perhaps this is related to #1. 3. No acute osseous abnormality. Exaggerated lumbar lordosis with multilevel mild spondylolisthesis and transitional lumbosacral anatomy. Chronic disc and endplate degeneration in the upper lumbar spine, and chronic posterior element degeneration in the lower lumbar spine but no spinal stenosis. 4. Diverticulosis of the large bowel in the lower abdomen and pelvis. Electronically Signed   By: Genevie Ann M.D.   On: 12/11/2017 11:08        Scheduled Meds: . cephALEXin  500 mg Oral Q6H   Continuous Infusions:   LOS: 0 days    Time spent: 40 minutes    Loretha Stapler, MD [facility] Pager [phone]  If 7PM-7AM, please contact night-coverage [email or web address] Password [email or web address] 12/11/2017, 11:30 AM

## 2017-12-12 DIAGNOSIS — R270 Ataxia, unspecified: Secondary | ICD-10-CM | POA: Diagnosis not present

## 2017-12-12 DIAGNOSIS — R531 Weakness: Secondary | ICD-10-CM | POA: Diagnosis not present

## 2017-12-12 DIAGNOSIS — L03115 Cellulitis of right lower limb: Secondary | ICD-10-CM | POA: Diagnosis not present

## 2017-12-12 DIAGNOSIS — I482 Chronic atrial fibrillation: Secondary | ICD-10-CM | POA: Diagnosis not present

## 2017-12-12 LAB — CBC
HCT: 31.4 % — ABNORMAL LOW (ref 39.0–52.0)
Hemoglobin: 9.9 g/dL — ABNORMAL LOW (ref 13.0–17.0)
MCH: 31.7 pg (ref 26.0–34.0)
MCHC: 31.5 g/dL (ref 30.0–36.0)
MCV: 100.6 fL — AB (ref 78.0–100.0)
PLATELETS: 578 10*3/uL — AB (ref 150–400)
RBC: 3.12 MIL/uL — ABNORMAL LOW (ref 4.22–5.81)
RDW: 15.2 % (ref 11.5–15.5)
WBC: 8.6 10*3/uL (ref 4.0–10.5)

## 2017-12-12 LAB — CK TOTAL AND CKMB (NOT AT ARMC)
CK, MB: 2.8 ng/mL (ref 0.5–5.0)
Relative Index: INVALID (ref 0.0–2.5)
Total CK: 75 U/L (ref 49–397)

## 2017-12-12 LAB — HEPARIN LEVEL (UNFRACTIONATED): Heparin Unfractionated: 0.17 IU/mL — ABNORMAL LOW (ref 0.30–0.70)

## 2017-12-12 LAB — CK: CK TOTAL: 79 U/L (ref 49–397)

## 2017-12-12 MED ORDER — HEPARIN (PORCINE) IN NACL 100-0.45 UNIT/ML-% IJ SOLN
1200.0000 [IU]/h | INTRAMUSCULAR | Status: DC
  Administered 2017-12-12: 1200 [IU]/h via INTRAVENOUS
  Filled 2017-12-12: qty 250

## 2017-12-12 MED ORDER — PREDNISONE 20 MG PO TABS
80.0000 mg | ORAL_TABLET | Freq: Every day | ORAL | Status: AC
  Administered 2017-12-12 – 2017-12-13 (×2): 80 mg via ORAL
  Filled 2017-12-12 (×2): qty 4

## 2017-12-12 MED ORDER — HEPARIN (PORCINE) IN NACL 100-0.45 UNIT/ML-% IJ SOLN
1400.0000 [IU]/h | INTRAMUSCULAR | Status: DC
  Administered 2017-12-13 – 2017-12-14 (×2): 1400 [IU]/h via INTRAVENOUS
  Filled 2017-12-12 (×2): qty 250

## 2017-12-12 MED ORDER — SODIUM CHLORIDE 0.9 % IV SOLN: 1000.0000 mg | INTRAVENOUS | Status: DC

## 2017-12-12 NOTE — NC FL2 (Signed)
[place] LEVEL OF CARE SCREENING TOOL     IDENTIFICATION  Patient Name: [name] Birthdate: 04-Oct-1941 Sex: male Admission Date (Current Location): 12/10/2017  [facility] and [place] Number:  [employer] and Address:  [facility] [address], [place]      Provider Number: [id number]  Attending Physician Name and Address:  Eber Jones, MD  Relative Name and Phone Number:       Current Level of Care: Other (Comment)(Patient is in observation) Recommended Level of Care: [place] Prior Approval Number:    Date Approved/Denied:   PASRR Number: [id number])  Discharge Plan: SNF    Current Diagnoses: Patient Active Problem List   Diagnosis Date Noted  . Ataxia 12/10/2017  . Unsteady gait 12/10/2017  . Cellulitis of right lower extremity 12/10/2017  . Basal cell carcinoma (BCC) 08/29/2017  . Actinic keratosis 08/20/2017  . Essential hypertension 04/30/2017  . Hypothyroid 04/30/2017  . HLD (hyperlipidemia) 04/30/2017  . CAD in native artery 04/30/2017  . 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 Pedal edema 04/30/2017  . Glaucoma 04/30/2017  . Abdominal aortic atherosclerosis ([place]) 04/30/2017  . 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 Asymptomatic gallstones 04/30/2017  . Benign prostatic hyperplasia 04/30/2017  . Diverticulosis 04/30/2017  . Esophagitis 04/30/2017  . Angular cheilitis 04/30/2017  . A-fib ([place]) 04/11/2017  . Rheumatoid arthritis ([place]) 04/11/2017    Orientation RESPIRATION BLADDER Height & Weight     Self, Time, Situation, Place  Normal Continent Weight: 179 lb (81.2 kg) Height:  5\' 10"  (177.8 cm)  BEHAVIORAL SYMPTOMS/MOOD NEUROLOGICAL BOWEL NUTRITION STATUS      Continent Diet  AMBULATORY STATUS COMMUNICATION OF NEEDS Skin   Limited Assist Verbally Normal                       Personal Care Assistance Level of  Assistance  Bathing, Feeding, Dressing Bathing Assistance: Limited assistance Feeding assistance: Independent Dressing Assistance: Limited assistance     Functional Limitations Info  Sight, Hearing, Speech Sight Info: Adequate Hearing Info: Adequate Speech Info: Adequate    SPECIAL CARE FACTORS FREQUENCY  PT (By licensed PT)     PT Frequency: 5x/week              Contractures Contractures Info: Not present    Additional Factors Info  Code Status Code Status Info: DNR             Current Medications (12/12/2017):  This is the current hospital active medication list Current Facility-Administered Medications  Medication Dose Route Frequency Provider Last Rate Last Dose  . acetaminophen (TYLENOL) tablet 650 mg  650 mg Oral Q6H PRN Eber Jones, MD       Or  . acetaminophen (TYLENOL) suppository 650 mg  650 mg Rectal Q6H PRN Eber Jones, MD      . cephALEXin (KEFLEX) capsule 500 mg  500 mg Oral Q6H Eber Jones, MD   500 mg at 12/12/17 0547  . furosemide (LASIX) injection 40 mg  40 mg Intravenous Daily Eber Jones, MD   40 mg at 12/12/17 0836  . heparin ADULT infusion 100 units/mL (25000 units/24mL sodium chloride 0.45%)  1,200 Units/hr Intravenous Continuous Eber Jones, MD      . ondansetron [facility]) tablet 4 mg  4 mg Oral Q6H PRN Eber Jones, MD       Or  . ondansetron [facility])  injection 4 mg  4 mg Intravenous Q6H PRN Ilene Qua U, MD      . polyethylene glycol (MIRALAX / GLYCOLAX) packet 17 g  17 g Oral Daily PRN Eber Jones, MD      . predniSONE (DELTASONE) tablet 80 mg  80 mg Oral Daily Eber Jones, MD         Discharge Medications: Please see discharge summary for a list of discharge medications.  Relevant Imaging Results:  Relevant Lab Results:   Additional Information SSN [id number], Clydene Pugh, LCSW

## 2017-12-12 NOTE — Progress Notes (Signed)
PROGRESS NOTE    [name]  UMP:[id number] DOB: 1941-11-14 DOA: 12/10/2017 PCP: Raylene Everts, MD    Brief Narrative:  [name] is a 76 y.o. male with medical history significant of atrial fibrillation, falls, HTN, RA, HLD, presented to the ED via EMS after a fall at home.    Patient reports he woke up and went into his bathroom and brushed his teeth. Patient reports he had a fall 2-3 weeks ago in which he fell face down on the sidewalk.  His legs started swelling at that time and Dr. Meda Coffee gave him medication to take care of this.  He does not recall the name of that medication but he says it was over the counter.  For about 2 weeks he reports the swelling stayed about the same and he had to use a cane to walk around.  He voices everything seemed fine until this am.  After brushing his teeth he went back into his bedroom to straighten the sheets and at one point he lost his balance and rolled back.  He fell backwards to his butt and attempted to get up.  He could not and so he crawled to his bedside table.  He attempted to use the table as leverage and still could not get up.  He had a life alert button and he pushed it to get help.   Patient reports he has had some incontinence today but then states he has been unable to urinate since this morning.  He denies loss of control of his bowels.  Patient states he lives alone but lives 5 minutes from his daughter who is currently in [place].  When reviewing patient's charts it appears that patient was seen on 11/27/2017 by his primary care physician who noted he was too weak to get up from a seated position as well as his unsteadiness with his gait.  It appears in her assessment and plan I discussed a referral to neurology as well as physical therapy even though he had just recently completed a course of physical therapy.  Dr. Meda Coffee also notes that patient sees a rheumatologist and she was going to call the rheumatologist to gain  insight into what could possibly be going on.  Patient denies that he has been unable to get up from a seated position and that he has been using a cane to walk.  He says that prior to the fall that he told me about previously he had been walking without concerns.  He denies fevers, chills, chest pain, chest pressure, lightheadedness, dizziness, shortness of breath, nausea, vomiting, diarrhea, constipation.  ED Course: Patient was seen by EDP.  Initial vital signs showed a respiratory rate of 14, pulse rate 55, blood pressure 122/59, and oxygen of 100% on room air.  Patient underwent CT of the head as well as MRI of the brain which showed no findings to explain his weakness.  He was unable to ambulate in the emergency department and was found to have right lower extremity erythema with concern for cellulitis.  Patient was started on IV vancomycin and TRH H was asked to admit for cellulitis and further evaluation of patient's ataxia.     Assessment & Plan:   Active Problems:   A-fib (HCC)   Essential hypertension   Hypothyroid   HLD (hyperlipidemia)   Ataxia   Unsteady gait   Cellulitis of right lower extremity   Ataxia and weakness -MRI of lumbar spine showing Patchy abnormal signal in  the visible lower spinal cord and conus is nonspecific. Are there symptoms of spinal cord infarct? There is no cord expansion to suggest tumor. The differential diagnosis would include transverse myelitis. Thoracic spine MRI without and with contrast may be valuable to further evaluate spinal cord. Diffuse abnormal paraspinal muscle (bilateral psoas and erector spinae muscle) compatible with nonspecific myositis such as due to mild muscle edema or denervation changes. No intramuscular fluid collection. Perhaps this is related to #1. No acute osseous abnormality. Exaggerated lumbar lordosis with multilevel mild spondylolisthesis and transitional lumbosacral anatomy. Chronic disc and endplate degeneration in the  upper lumbar spine, and chronic posterior element degeneration in the lower lumbar spine but no spinal stenosis. Diverticulosis of the large bowel in the lower abdomen and Pelvis. 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 No underlying cord compression spinal stenosis or mass lesion identified - will do two days of steroids but will. Mild thoracic disc and facet degeneration -Patient was not started on high-dose IV steroids as he is not severely or critically ill but will be started on dose of prednisone of 80 mg a day to monitor for signs of improvement -CK and CK-MB not elevated -Neurology following and appreciate their recommendations   Atrial fibrillation - telemetry -Heparin - can restart heparin at 4pm  HTN - continue lisinopril  Lower extremity edema - continue lasix and potassium - patient refused lasix yesterday PM  Cellulitis - patient on Keflex but refused it last night - nonpurulent  HLD - continue statin  Anemia - patient iron deficient -Start p.o. iron    DVT prophylaxis: patient on xarelto at home but holding this and restart heparin this afternoon 24 hours after lumbar puncture  Code Status: DNR Family Communication: Will call patient's daughter to update her Disposition Plan: Starting p.o. steroids at 80 mg, ending improvement in weakness   Consultants:   Neurology  PT  OT  Procedures:   None  Antimicrobials:   None    Subjective: Patient reports he tolerated his procedure fine.  He denies any improvement in his weakness.  He states he is interested in going to a short-term rehab for physical therapy.  Discussed with his daughter who is in agreement.  Neurology saw him yesterday and agreed to a short term dose of steroids.  Objective: Vitals:   12/11/17 0605 12/11/17 2032 12/12/17 0557 12/12/17 0947  BP: (!) 107/52 (!) 114/53 (!) 111/55   Pulse: 76 61 (!) 54   Resp: 18 17 18     Temp: 98.3 F (36.8 C) 99.3 F (37.4 C) 98.6 F (37 C)   TempSrc: Oral Oral Oral   SpO2: 97% 100% 94% 96%  Weight:      Height:        Intake/Output Summary (Last 24 hours) at 12/12/2017 1503 Last data filed at 12/12/2017 1110 Gross per 24 hour  Intake 600 ml  Output 200 ml  Net 400 ml   Filed Weights   12/10/17 0918 12/10/17 1652  Weight: 79.4 kg (175 lb) 81.2 kg (179 lb)    Examination:  General exam: Appears calm and comfortable  Respiratory system: Clear to auscultation. Respiratory effort normal. Cardiovascular system: S1 & S2 heard, irregularly irregular.  No JVD, murmurs, rubs, gallops or clicks.  2+ edema to the knees bilaterally Gastrointestinal system: Abdomen is nondistended, soft and nontender. No organomegaly or masses felt. Normal bowel sounds heard. Central nervous system: Alert and oriented.  Cranial nerves II through XII are grossly intact Extremities: able to plantar and dorsiflex as well as flex and extend at the knee bilaterally.  Flexion at the hip appreciated bilaterally.  Difficulty in sit to stand Skin: Minimal erythema of the lower legs and is not warm and is blanchable Psychiatry: Questionable insight as patient keeps deferring to his daughter and asking that I call her. mood & affect appropriate.     Data Reviewed: I have personally reviewed following labs and imaging studies  CBC: Recent Labs  Lab 12/10/17 0948 12/12/17 0425  WBC 10.5 8.6  NEUTROABS 8.1*  --   HGB 9.9* 9.9*  HCT 31.2* 31.4*  MCV 101.0* 100.6*  PLT 572* 381*   Basic Metabolic Panel: Recent Labs  Lab 12/10/17 0948  NA 133*  K 4.3  CL 100*  CO2 28  GLUCOSE 115*  BUN 12  CREATININE 0.77  CALCIUM 8.2*   GFR: Estimated Creatinine Clearance: 81.1 mL/min (by C-G formula based on SCr of 0.77 mg/dL). Liver Function Tests: No results for input(s): AST, ALT, ALKPHOS, BILITOT, PROT, ALBUMIN in the last 168 hours. No results for input(s): LIPASE, AMYLASE in the last  168 hours. No results for input(s): AMMONIA in the last 168 hours. Coagulation Profile: No results for input(s): INR, PROTIME in the last 168 hours. Cardiac Enzymes: Recent Labs  Lab 12/10/17 1754 12/10/17 2212 12/11/17 0422 12/11/17 2013 12/12/17 1309  CKTOTAL  --   --   --  75 79  CKMB  --   --   --  2.8  --   TROPONINI <0.03 <0.03 <0.03  --   --    BNP (last 3 results) No results for input(s): PROBNP in the last 8760 hours. HbA1C: No results for input(s): HGBA1C in the last 72 hours. CBG: No results for input(s): GLUCAP in the last 168 hours. Lipid Profile: No results for input(s): CHOL, HDL, LDLCALC, TRIG, CHOLHDL, LDLDIRECT in the last 72 hours. Thyroid Function Tests: No results for input(s): TSH, T4TOTAL, FREET4, T3FREE, THYROIDAB in the last 72 hours. Anemia Panel: Recent Labs    12/10/17 0957  FERRITIN 577*  TIBC 210*  IRON 25*   Sepsis Labs: Recent Labs  Lab 12/10/17 0948  LATICACIDVEN 1.6    Recent Results (from the past 240 hour(s))  Culture, blood (routine x 2)     Status: None (Preliminary result)   Collection Time: 12/10/17  9:49 AM  Result Value Ref Range Status   Specimen Description BLOOD RIGHT ARM  Final   Special Requests   Final    BOTTLES DRAWN AEROBIC AND ANAEROBIC Blood Culture adequate volume   Culture NO GROWTH 2 DAYS  Final   Report Status PENDING  Incomplete  CSF culture     Status: None (Preliminary result)   Collection Time: 12/11/17  3:40 PM  Result Value Ref Range Status   Specimen Description CSF  Final   Special Requests NONE  Final   Gram Stain   Final    NO ORGANISMS SEEN Performed at [facility]    Culture   Final    NO GROWTH < 12 HOURS Performed at [facility], [place] [address] [place], [place] [address]    Report Status PENDING  Incomplete         Radiology Studies: Mr Lumbar Spine Wo Contrast  Result Date: 12/11/2017 CLINICAL DATA:  76 year old male with progressive weakness for 2-4 weeks.  Fall in April. EXAM: MRI LUMBAR SPINE WITHOUT CONTRAST  TECHNIQUE: Multiplanar, multisequence MR imaging of the lumbar spine was performed. No intravenous contrast was administered. COMPARISON:  Lumbar radiographs 04/01/2017. FINDINGS: Segmentation: Transitional anatomy suspected with lumbarized S1 level based on the appearance of the April lumbar radiographs. Full size ribs designated at T12 by this numbering system. Correlation with radiographs is recommended prior to any operative intervention. Alignment: Mild grade 1 anterolisthesis at both L5-S1 and S1-S2 with exaggerated lower lumbar lordosis. Mild retrolisthesis of L2 on L3. Vertebrae: Mild T12 superior endplate deformity with minimal associated superior endplate marrow edema (series 5, image 13). Normal marrow signal elsewhere in the T12 vertebral body. Chronic degenerative appearing endplate marrow signal changes at L2-L3, and also anteriorly at L1-L2. Background bone marrow signal elsewhere within normal limits. No other No marrow edema or evidence of acute osseous abnormality. Intact visible sacrum and SI joints. Conus medullaris and cauda equina: Conus extends to the L1-L2 level. There is subtle patchy T2 and STIR hyperintensity within the lower thoracic spinal cord seen at the T11-T12 level on both sagittal series 3, image 10 and axial series 6, image 1. Superimposed more central linear and intense signal abnormality in in the cord posterior to the T12 vertebral body could be superimposed prominence of the central spinal canal. Persistent central cord T2 hyperintensity continues to the conus as seen on series 6, image 4. No cord expansion. The cauda equina nerve roots appear normal. Paraspinal and other soft tissues: Generalized bilateral patchy abnormal increased STIR signal in the bilateral posterior erector spinae muscles as seen on series 5, image 18 (on the left) and image 1 (on the right) similar indistinct bilateral medial psoas muscle signal  abnormality as seen on series 5, image 17. The muscles do not appear expanded. No intramuscular fluid collection. Negative visualized abdominal viscera. Diverticulosis of the colon in the lower abdomen and pelvis. Disc levels: Age congruent lumbar spine degeneration at most levels. There is mild retrolisthesis at L2-L3 with advanced disc and endplate degeneration eccentric to the left, but no spinal stenosis. There is mild left lateral recess and left foraminal stenosis at that level. There is moderate and severe L4-L5, L5-S1 and S1-S2 facet degeneration. Bilateral degenerative L5-S1 facet joint fluid. These changes contribute to mild bilateral L4 neural foraminal stenosis, and mild L5-S1 lateral recess and foraminal stenosis. No significant spinal stenosis. IMPRESSION: 1. Patchy abnormal signal in the visible lower spinal cord and conus is nonspecific. Are there symptoms of spinal cord infarct? There is no cord expansion to suggest tumor. The differential diagnosis would include transverse myelitis. Thoracic spine MRI without and with contrast may be valuable to further evaluate spinal cord. 2. Diffuse abnormal paraspinal muscle (bilateral psoas and erector spinae muscle) compatible with nonspecific myositis such as due to mild muscle edema or denervation changes. No intramuscular fluid collection. Perhaps this is related to #1. 3. No acute osseous abnormality. Exaggerated lumbar lordosis with multilevel mild spondylolisthesis and transitional lumbosacral anatomy. Chronic disc and endplate degeneration in the upper lumbar spine, and chronic posterior element degeneration in the lower lumbar spine but no spinal stenosis. 4. Diverticulosis of the large bowel in the lower abdomen and pelvis. Electronically Signed   By: Genevie Ann M.D.   On: 12/11/2017 11:08   Mr Thoracic Spine W Wo Contrast  Result Date: 12/11/2017 CLINICAL DATA:  Ataxia EXAM: MRI THORACIC WITHOUT AND WITH CONTRAST TECHNIQUE: Multiplanar and  multiecho pulse sequences of the thoracic spine were obtained without and with intravenous contrast. CONTRAST:  75mL MULTIHANCE GADOBENATE DIMEGLUMINE 529 MG/ML  IV SOLN COMPARISON:  None. FINDINGS: MRI THORACIC SPINE FINDINGS Alignment:  Normal Vertebrae: Mild chronic fracture superior endplate of S50. No acute fracture or mass. Cord: Thoracic cord syrinx. The largest syrinx measures 2.5 mm at T6. Smaller syrinx extends down to T8. Small syrinx again at T12. No cord mass or enhancing lesion identified. Paraspinal and other soft tissues: Negative Disc levels: No significant spinal stenosis. Mild facet degeneration in the mid and lower thoracic spine. No focal disc protrusion. 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 No underlying cord compression spinal stenosis or mass lesion identified Mild thoracic disc and facet degeneration. Electronically Signed   By: Franchot Gallo M.D.   On: 12/11/2017 14:59   Dg Fluoro Guide Lumbar Puncture  Result Date: 12/11/2017 CLINICAL DATA:  Ataxia, cord lesions on thoracic and lumbar MRI examinations. EXAM: DIAGNOSTIC LUMBAR PUNCTURE UNDER FLUOROSCOPIC GUIDANCE FLUOROSCOPY TIME:  Fluoroscopy Time:  0 minutes, 30 seconds Radiation Exposure Index (if provided by the fluoroscopic device): 3.2 mGy Number of Acquired Spot Images: 0 PROCEDURE: I discussed the risks (including hemorrhage, infection, headache, and nerve damage, among others), benefits, and alternatives to fluoroscopically guided lumbar puncture with the patient. We specifically discussed the high technical likelihood of success of the procedure. The patient understood and elected to undergo the procedure. Standard time-out was employed. Following sterile skin prep and local anesthetic administration consisting of 1 percent lidocaine, a 22 gauge spinal needle was advanced without difficulty into the thecal sac at the at the L3-4 level. I elected to  use a slight paramedian approach due to the very small amount of interspinous space appreciable on the lumbar MRI. Clear CSF was returned. Opening pressure was not directly measured due to projected difficulty in turning the patient, but did not seem elevated given the sluggish flow of CSF. 11 cc of clear CSF was collected. The needle was subsequently removed and the skin cleansed and bandaged. No immediate complications were observed. IMPRESSION: 1. Technically successful lumbar puncture at the L3-4 level, yielding 11 cc of clear CSF sent to the lab for requested analyses. Electronically Signed   By: Van Clines M.D.   On: 12/11/2017 16:35        Scheduled Meds: . cephALEXin  500 mg Oral Q6H  . furosemide  40 mg Intravenous Daily   Continuous Infusions: . heparin       LOS: 0 days    Time spent:35 minutes    Loretha Stapler, MD [facility] Pager [phone]  If 7PM-7AM, please contact night-coverage [email or web address] Password [email or web address] 12/12/2017, 3:03 PM

## 2017-12-12 NOTE — Clinical Social Work Note (Signed)
Clinical Social Work Assessment  Patient Details  Name: [name] MRN: [id number] Date of Birth: 05-08-1941  Date of referral:  12/12/17               Reason for consult:  Facility Placement                Permission sought to share information with:    Permission granted to share information::     Name::        Agency::     Relationship::     Contact Information:  daughter, [name]  Housing/Transportation Living arrangements for the past 2 months:  [place] of Information:  Patient, Adult Children Patient Interpreter Needed:  None Criminal Activity/Legal Involvement Pertinent to Current Situation/Hospitalization:  No - Comment as needed Significant Relationships:  Adult Children Lives with:  Self Do you feel safe going back to the place where you live?  Yes Need for family participation in patient care:  Yes (Comment)  Care giving concerns:  Patient reports no care giving issues at  Baseline.    Social Worker assessment / plan:  Patient reports that he lives alone, at baseline ambulates with a cane, is independent in ADLs and still drives. He is currently unsure as to whether he desires SNF.  Patient deferred to his daughter [name] [phone], who is currently on vacation at [facility] until Saturday. [name] advised that she desires for patient to go to SNF short term. She states that patient needs to get a little bit stronger. She stated that she is unfamiliar with facilities in the area but did want his information sent to [facility]. LCSW discussed patient wanted to remain in [place] should he go to SNF. LCSW emailed patient's daughter a SNF list as she indicated that she had access to her email. She stated that if [facility] was unable to accept patient she choose other facilities. LCSW discussed that patient's SNF placement was contingent on his insurance company authorizing the placement.   Employment status:  Retired Office manager PT Recommendations:  [place] / Referral to community resources:  [place]  Patient/Family's Response to care:  Patient deferred to his daughter regarding SNF.   Patient/Family's Understanding of and Emotional Response to Diagnosis, Current Treatment, and Prognosis:  Family understands diagnosis, treatment and prognosis.   Emotional Assessment Appearance:  Appears stated age Attitude/Demeanor/Rapport:    Affect (typically observed):  Accepting, Calm Orientation:  Oriented to Self, Oriented to Place, Oriented to  Time, Oriented to Situation Alcohol / Substance use:  Not Applicable Psych involvement (Current and /or in the community):     Discharge Needs  Concerns to be addressed:  No discharge needs identified Readmission within the last 30 days:  No Current discharge risk:  None Barriers to Discharge:  No Barriers Identified   Derrick Gully, LCSW 12/12/2017, 4:53 PM

## 2017-12-12 NOTE — Progress Notes (Signed)
Physical Therapy Treatment Patient Details Name: [name] MRN: [id number] DOB: June 16, 1941 Today's Date: 12/12/2017    History of Present Illness [name] is a 76 y.o. male with medical history significant of atrial fibrillation, falls, HTN, RA, HLD, presented to the ED via EMS after a fall at home.    Patient reports he woke up and went into his bathroom and brushed his teeth. Patient reports he had a fall 2-3 weeks ago in which he fell face down on the sidewalk.  His legs started swelling at that time and Dr. Meda Coffee gave him medication to take care of this.  He does not recall the name of that medication but he says it was over the counter.  For about 2 weeks he reports the swelling stayed about the same and he had to use a cane to walk around.  He voices everything seemed fine until this am.  After brushing his teeth he went back into his bedroom to straighten the sheets and at one point he lost his balance and rolled back.  He fell backwards to his butt and attempted to get up.  He could not and so he crawled to his bedside table.  He attempted to use the table as leverage and still could not get up.  He had a life alert button and he pushed it to get help.     PT Comments    Pt supine in bed and willing to participate, no reports of pain today.  Pt independent with bed mobility and min guard with transfer with cueing for hand placement to assist with sit to stand for safety.  Pt with LOB initially upon standing but able to ambulate increased distance safely with use of RW.  EOS pt left in bed due to time and fatigue, call bell within reach.  No pain through session.    Follow Up Recommendations  SNF;Supervision/Assistance - 24 hour     Equipment Recommendations  Rolling walker with 5" wheels    Recommendations for Other Services       Precautions / Restrictions Precautions Precautions: Fall Precaution Comments: 3 falls in past month    Mobility  Bed Mobility Overal  bed mobility: Independent                Transfers Overall transfer level: Modified independent Equipment used: Rolling walker (2 wheeled);Straight cane Transfers: Sit to/from Stand Sit to Stand: Min assist         General transfer comment: cueing for hand placement with sit to stand to assist, initial LOB upon standing with min A for safety  Ambulation/Gait Ambulation/Gait assistance: Min guard Ambulation Distance (Feet): 120 Feet Assistive device: Rolling walker (2 wheeled) Gait Pattern/deviations: Decreased step length - right;Decreased step length - left;Decreased stride length   Gait velocity interpretation: Below normal speed for age/gender General Gait Details: pt unsteady initially upon standing, able to ambulate with RW with improved stride length and somewhat wide BOS.  Able to increase distance with RW no c/o SOB, LOB or pain.     Stairs            Wheelchair Mobility    Modified Rankin (Stroke Patients Only)       Balance                                            Cognition Arousal/Alertness: Awake/alert Behavior  During Therapy: WFL for tasks assessed/performed Overall Cognitive Status: Within Functional Limits for tasks assessed                                        Exercises      General Comments        Pertinent Vitals/Pain Pain Assessment: No/denies pain    Home Living                      Prior Function            PT Goals (current goals can now be found in the care plan section) Acute Rehab PT Goals Patient Stated Goal: return home  Progress towards PT goals: Progressing toward goals    Frequency    Min 3X/week      PT Plan      Co-evaluation              AM-PAC PT "6 Clicks" Daily Activity  Outcome Measure  Difficulty turning over in bed (including adjusting bedclothes, sheets and blankets)?: None Difficulty moving from lying on back to sitting on the side  of the bed? : None Difficulty sitting down on and standing up from a chair with arms (e.g., wheelchair, bedside commode, etc,.)?: A Little Help needed moving to and from a bed to chair (including a wheelchair)?: A Little Help needed walking in hospital room?: A Little Help needed climbing 3-5 steps with a railing? : A Little 6 Click Score: 20    End of Session Equipment Utilized During Treatment: Gait belt Activity Tolerance: Patient tolerated treatment well;No increased pain Patient left: in bed;with call bell/phone within reach   PT Visit Diagnosis: Unsteadiness on feet (R26.81);Other abnormalities of gait and mobility (R26.89);Muscle weakness (generalized) (M62.81)     Time: 4970-2637 PT Time Calculation (min) (ACUTE ONLY): 17 min  Charges:  $Therapeutic Activity: 8-22 mins                    G Codes:       Ihor Austin, LPTA; CBIS [phone]  Aldona Lento 12/12/2017, 4:33 PM

## 2017-12-12 NOTE — Progress Notes (Signed)
ANTICOAGULATION CONSULT NOTE - Initial Consult  Pharmacy Consult for heparin Indication: atrial fibrillation  No Known Allergies  Patient Measurements: Height: 5\' 10"  (177.8 cm) Weight: 179 lb (81.2 kg) IBW/kg (Calculated) : 73 Heparin Dosing Weight: 81 kg  Vital Signs: Temp: 98.6 F (37 C) (12/19 0557) Temp Source: Oral (12/19 0557) BP: 111/55 (12/19 0557) Pulse Rate: 54 (12/19 0557)  Labs: Recent Labs    12/10/17 0948 12/10/17 1754 12/10/17 2212 12/11/17 0422 12/11/17 1350 12/11/17 2013 12/12/17 0425  HGB 9.9*  --   --   --   --   --  9.9*  HCT 31.2*  --   --   --   --   --  31.4*  PLT 572*  --   --   --   --   --  578*  APTT  --   --   --   --  38* 38*  --   HEPARINUNFRC  --   --   --   --  0.12* 0.12*  --   CREATININE 0.77  --   --   --   --   --   --   TROPONINI  --  <0.03 <0.03 <0.03  --   --   --     Estimated Creatinine Clearance: 81.1 mL/min (by C-G formula based on SCr of 0.77 mg/dL).   Medical History: Past Medical History:  Diagnosis Date  . A-fib ([place])   . Arthritis    osteoarthritis  . Basal cell carcinoma (BCC) 08/29/2017  . Cataract   . Frequent falls   . Glaucoma   . High cholesterol   . Hypertension   . MI (myocardial infarction) ([place])   . Rheumatoid arthritis ([place])   . Thyroid disease     Medications:  Medications Prior to Admission  Medication Sig Dispense Refill Last Dose  . acetaminophen (TYLENOL) 500 MG tablet Take 1,000 mg by mouth every 6 (six) hours as needed.   Taking  . folic acid (FOLVITE) 1 MG tablet Take 1 tablet (1 mg total) by mouth daily. 90 tablet 3 Taking  . furosemide (LASIX) 40 MG tablet Take 1 tablet (40 mg total) by mouth daily as needed. 30 tablet 6 Taking  . inFLIXimab (REMICADE) 100 MG injection Inject into the vein.   Past Month at Unknown time  . levothyroxine (SYNTHROID, LEVOTHROID) 75 MCG tablet Take 1 tablet (75 mcg total) by mouth daily before breakfast. 90 tablet 3 Taking  . lisinopril  (PRINIVIL,ZESTRIL) 2.5 MG tablet Take 1 tablet (2.5 mg total) by mouth daily. 90 tablet 3 Taking  . methotrexate 2.5 MG tablet Take 15 mg by mouth every Sunday. 6 tablets on Sunday   Taking  . polycarbophil (FIBERCON) 625 MG tablet Take 625 mg by mouth at bedtime.   Taking  . potassium chloride SA (K-DUR,KLOR-CON) 20 MEQ tablet Tale Potassium 20 meq daily for 5 days and then daily as NEEDED for leg swelling (Patient taking differently: Take 20 mEq by mouth daily as needed. as NEEDED for leg swelling) 90 tablet 3 Taking  . rivaroxaban (XARELTO) 20 MG TABS tablet Take 1 tablet (20 mg total) by mouth daily with supper. (Patient taking differently: Take 20 mg by mouth every morning. ) 90 tablet 3 12/09/2017 at 1000  . simvastatin (ZOCOR) 40 MG tablet Take 1 tablet (40 mg total) by mouth daily. 90 tablet 3 12/10/2017 at Unknown time  . timolol (TIMOPTIC) 0.5 % ophthalmic solution INSTILL ONE DROP INTO [facility]  EYE TWICE DAILY   Taking  . neomycin-polymyxin-hydrocortisone (CORTISPORIN) OTIC solution Place 3 drops into both ears 4 (four) times daily. (Patient not taking: Reported on 12/10/2017) 10 mL 0 Not Taking at Unknown time    Assessment: 76 yo man who was on xarelto pta now to start heparin drip.  Last dose xarelto 12/16 at 10:00. Initial plan was to start heparin yesterday but delayed due to LP. Anti Xa level last night 0.12 units/ml, aPTT 38 sec Goal of Therapy:  Heparin level 0.3-0.7 units/ml Monitor platelets by anticoagulation protocol: Yes   Plan:  Start heparin drip at 1400 units/hr at 4pm today Check anti Xa level in 6 hours then daily Monitor for bleeding complications   Roel Douthat Poteet 12/12/2017,1:15 PM

## 2017-12-12 NOTE — Progress Notes (Signed)
PT Cancellation Note  Patient Details Name: [name] MRN: [id number] DOB: May 26, 1941   Cancelled Treatment:    Reason Eval/Treat Not Completed: Medical issues which prohibited therapy Hold PT session per RN reports of lumbar puncture completed yesterday instructed bed rest for 24 hours until 1600.  [address] LPTA; [place]  Aldona Lento 12/12/2017, 10:28 AM

## 2017-12-12 NOTE — Consult Note (Signed)
[place] A. Merlene Laughter, MD     [email or web address]          [name] is an 76 y.o. male.   ASSESSMENT/PLAN: 1. Subacute multifactorial gait impairment: The presentation is nonspecific with no clear defining syndrome or etiology. It is likely multifactorial including rheumatoid arthritis, osteoarthritis, aging and possibly medication effect. 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 While these can cause problems with balance and gait, he does not appears to be overtly myelopathic. Statin induced myopathy is a possibility but I think this is less likely given the clinical picture. The exam does not support reported severe neuropathy. Repeat orthostatics will be obtained. Exam is mildly improved.  CSF findings are nondiagnostic and nonspecific.  Continue with current treatment.  May consider we in steroid after five days.  Sign off reconsult as needed.   2. Chronic atrial fibrillation with the the anticoagulation be held currently due to the patient's frequent falls.      No new complaints today.     GENERAL: He is in no acute distress. He is pleasant and cooperates with evaluation.  HEENT: This is essentially unrevealing with neck supple and no other obvious trauma.  ABDOMEN: soft  EXTREMITIES: There is significant arthritic changes and some swelling involving the hands with primarily the metacarpophalangeal joints and some ulnar deviation. There is a marked arthritic changes of the knees and the lower extremities. 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 There is mild swelling involving the feet and distal ankles.  BACK: Normal  SKIN: Normal by inspection.    MENTAL STATUS: He is awake and the responsive. He is lucid and coherent although it has difficulties following commands because of hearing impairment.   CRANIAL NERVES: Pupils are equal, round and reactive to light and accomodation; extra ocular movements are full, there  is no significant nystagmus; visual fields are full; upper and lower facial muscles are normal in strength and symmetric, there is no flattening of the nasolabial folds; tongue is midline; uvula is midline; shoulder elevation is normal.  MOTOR: Upper extremities show Normal tone, bulk and strength; no pronator drift. Left hip flexion is 4/5 and dorsiflexion 4. Right hip flexion is 4 in dorsiflexion 4. Bulk and tone are normal.  COORDINATION: Left finger to nose is normal, right finger to nose is normal, No rest tremor; no intention tremor; no postural tremor; no bradykinesia.  REFLEXES: Deep tendon reflexes are symmetrical and normal involving the upper extremities; left knee is diminished right knee slightly brisk. Babinski reflexes are flexor bilaterally.   SENSATION: Response to pain bilaterally.        Blood pressure (!) 111/55, pulse (!) 54, temperature 98.2 F (36.8 C), temperature source Oral, resp. rate 18, height 5\' 10"  (1.778 m), weight 179 lb (81.2 kg), SpO2 98 %.  Past Medical History:  Diagnosis Date  . A-fib ([place])   . Arthritis    osteoarthritis  . Basal cell carcinoma (BCC) 08/29/2017  . Cataract   . Frequent falls   . Glaucoma   . High cholesterol   . Hypertension   . MI (myocardial infarction) ([place])   . Rheumatoid arthritis ([place])   . Thyroid disease     Past Surgical History:  Procedure Laterality Date  . CORONARY ANGIOPLASTY WITH STENT PLACEMENT    . JOINT REPLACEMENT     left knee  . REPLACEMENT TOTAL KNEE Left     Family History  Problem Relation Age of Onset  .  Heart attack Father 22  . Early death Father   . Alzheimer's disease Mother 61  . Early death Sister        MVA  . Hyperlipidemia Brother   . Heart disease Brother     Social History:  reports that he quit smoking about 41 years ago. His smoking use included cigarettes. He quit after 15.00 years of use. he has never used smokeless tobacco. He reports that he does not drink alcohol or use  drugs.  Allergies: No Known Allergies  Medications: Prior to Admission medications   Medication Sig Start Date End Date Taking? Authorizing Provider  folic acid (FOLVITE) 1 MG tablet Take 1 tablet (1 mg total) by mouth daily. 04/30/17  Yes Raylene Everts, MD  furosemide (LASIX) 40 MG tablet Take 1 tablet (40 mg total) by mouth daily as needed. 08/16/17 12/10/17 Yes Lendon Colonel, NP  inFLIXimab (REMICADE) 100 MG injection Inject into the vein. 02/24/15  Yes [provider]  levothyroxine (SYNTHROID, LEVOTHROID) 75 MCG tablet Take 1 tablet (75 mcg total) by mouth daily before breakfast. 04/30/17  Yes Raylene Everts, MD  lisinopril (PRINIVIL,ZESTRIL) 2.5 MG tablet Take 1 tablet (2.5 mg total) by mouth daily. 08/16/17 12/10/17 Yes Lendon Colonel, NP  methotrexate 2.5 MG tablet Take 15 mg by mouth every Sunday. 6 tablets on Sunday   Yes [provider]  rivaroxaban (XARELTO) 20 MG TABS tablet Take 1 tablet (20 mg total) by mouth daily with supper. Patient taking differently: Take 20 mg by mouth every morning.  04/16/17  Yes Herminio Commons, MD  simvastatin (ZOCOR) 40 MG tablet Take 1 tablet (40 mg total) by mouth daily. 04/30/17  Yes Raylene Everts, MD  acetaminophen (TYLENOL) 500 MG tablet Take 1,000 mg by mouth every 6 (six) hours as needed.    [provider]  neomycin-polymyxin-hydrocortisone (CORTISPORIN) OTIC solution Place 3 drops into both ears 4 (four) times daily. Patient not taking: Reported on 12/10/2017 08/06/17   Raylene Everts, MD  polycarbophil (FIBERCON) 625 MG tablet Take 625 mg by mouth at bedtime.    [provider]  potassium chloride SA (K-DUR,KLOR-CON) 20 MEQ tablet Tale Potassium 20 meq daily for 5 days and then daily as NEEDED for leg swelling Patient taking differently: Take 20 mEq by mouth daily as needed. as NEEDED for leg swelling 04/16/17   Herminio Commons, MD  timolol (TIMOPTIC) 0.5 % ophthalmic solution  INSTILL ONE DROP INTO EACH EYE TWICE DAILY 12/15/13   [provider]    Scheduled Meds: . cephALEXin  500 mg Oral Q6H  . furosemide  40 mg Intravenous Daily  . predniSONE  80 mg Oral Daily   Continuous Infusions: . heparin 1,400 Units/hr (12/12/17 2328)   PRN Meds:.acetaminophen **OR** acetaminophen, ondansetron **OR** ondansetron (ZOFRAN) IV, polyethylene glycol     Results for orders placed or performed during the hospital encounter of 12/10/17 (from the past 48 hour(s))  Troponin I     Status: None   Collection Time: 12/11/17  4:22 AM  Result Value Ref Range   Troponin I <0.03 <0.03 ng/mL  APTT     Status: Abnormal   Collection Time: 12/11/17  1:50 PM  Result Value Ref Range   aPTT 38 (H) 24 - 36 seconds    Comment:        IF BASELINE aPTT IS ELEVATED, SUGGEST PATIENT RISK ASSESSMENT BE USED TO DETERMINE APPROPRIATE ANTICOAGULANT THERAPY.   Heparin level (unfractionated)  Status: Abnormal   Collection Time: 12/11/17  1:50 PM  Result Value Ref Range   Heparin Unfractionated 0.12 (L) 0.30 - 0.70 IU/mL    Comment:        IF HEPARIN RESULTS ARE BELOW EXPECTED VALUES, AND PATIENT DOSAGE HAS BEEN CONFIRMED, SUGGEST FOLLOW UP TESTING OF ANTITHROMBIN III LEVELS.   Glucose, CSF     Status: None   Collection Time: 12/11/17  3:40 PM  Result Value Ref Range   Glucose, CSF 60 40 - 70 mg/dL  Protein, CSF     Status: Abnormal   Collection Time: 12/11/17  3:40 PM  Result Value Ref Range   Total  Protein, CSF 50 (H) 15 - 45 mg/dL  CSF cell count with differential     Status: Abnormal   Collection Time: 12/11/17  3:40 PM  Result Value Ref Range   Tube # 4    Color, CSF CLEAR (A) COLORLESS   Appearance, CSF COLORLESS (A) CLEAR   Supernatant CLEAR    RBC Count, CSF 0 0 /cu mm   WBC, CSF <10 (H) 0 - 5 /cu mm    Comment: CORRECTED ON 12/18 AT 1721: PREVIOUSLY REPORTED AS 0   Segmented Neutrophils-CSF TOO FEW TO COUNT, SMEAR AVAILABLE FOR REVIEW 0 - 6 %   Lymphs,  CSF TOO FEW TO COUNT, SMEAR AVAILABLE FOR REVIEW 40 - 80 %    Comment: PREDOMINATE CELLS SEEN   Monocyte-Macrophage-Spinal Fluid TOO FEW TO COUNT, SMEAR AVAILABLE FOR REVIEW 15 - 45 %   Eosinophils, CSF TOO FEW TO COUNT, SMEAR AVAILABLE FOR REVIEW 0 - 1 %  CSF culture     Status: None (Preliminary result)   Collection Time: 12/11/17  3:40 PM  Result Value Ref Range   Specimen Description CSF    Special Requests NONE    Gram Stain      NO ORGANISMS SEEN Performed at [place] < 12 HOURS Performed at [facility], [place] [address] [place], [place] [address]    Report Status PENDING   Heparin level (unfractionated)     Status: Abnormal   Collection Time: 12/11/17  8:13 PM  Result Value Ref Range   Heparin Unfractionated 0.12 (L) 0.30 - 0.70 IU/mL    Comment:        IF HEPARIN RESULTS ARE BELOW EXPECTED VALUES, AND PATIENT DOSAGE HAS BEEN CONFIRMED, SUGGEST FOLLOW UP TESTING OF ANTITHROMBIN III LEVELS.   APTT     Status: Abnormal   Collection Time: 12/11/17  8:13 PM  Result Value Ref Range   aPTT 38 (H) 24 - 36 seconds    Comment:        IF BASELINE aPTT IS ELEVATED, SUGGEST PATIENT RISK ASSESSMENT BE USED TO DETERMINE APPROPRIATE ANTICOAGULANT THERAPY.   CK total and CKMB (cardiac)not at [facility]     Status: None   Collection Time: 12/11/17  8:13 PM  Result Value Ref Range   Total CK 75 49 - 397 U/L   CK, MB 2.8 0.5 - 5.0 ng/mL   Relative Index RELATIVE INDEX IS INVALID 0.0 - 2.5    Comment: WHEN CK < 100 U/L        Performed at [facility] [address] [place],  [address]   CBC     Status: Abnormal   Collection Time: 12/12/17  4:25 AM  Result Value Ref Range   WBC 8.6  4.0 - 10.5 K/uL   RBC 3.12 (L) 4.22 - 5.81 MIL/uL   Hemoglobin 9.9 (L) 13.0 - 17.0 g/dL   HCT 31.4 (L) 39.0 - 52.0 %   MCV 100.6 (H) 78.0 - 100.0 fL   MCH 31.7 26.0 - 34.0 pg   MCHC 31.5 30.0 - 36.0 g/dL   RDW 15.2 11.5 - 15.5 %   Platelets  578 (H) 150 - 400 K/uL  CK     Status: None   Collection Time: 12/12/17  1:09 PM  Result Value Ref Range   Total CK 79 49 - 397 U/L  Heparin level (unfractionated)     Status: Abnormal   Collection Time: 12/12/17 10:21 PM  Result Value Ref Range   Heparin Unfractionated 0.17 (L) 0.30 - 0.70 IU/mL    Comment:        IF HEPARIN RESULTS ARE BELOW EXPECTED VALUES, AND PATIENT DOSAGE HAS BEEN CONFIRMED, SUGGEST FOLLOW UP TESTING OF ANTITHROMBIN III LEVELS.     Studies/Results:  Prior labs early this year TSH 2.3, vitamin B12 381 and vitamin-D 38.   Thoracic spine MRI: FINDINGS: MRI THORACIC SPINE FINDINGS  Alignment:  Normal  Vertebrae: Mild chronic fracture superior endplate of B51. No acute fracture or mass.  Cord: Thoracic cord syrinx. The largest syrinx measures 2.5 mm at T6. Smaller syrinx extends down to T8. Small syrinx again at T12. No cord mass or enhancing lesion identified.  Paraspinal and other soft tissues: Negative  Disc levels:  No significant spinal stenosis. Mild facet degeneration in the mid and lower thoracic spine. No focal disc protrusion. 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 No underlying cord compression spinal stenosis or mass lesion identified  Mild thoracic disc and facet degeneration.      Lumbar spine MRI: FINDINGS: Segmentation: Transitional anatomy suspected with lumbarized S1 level based on the appearance of the April lumbar radiographs. Full size ribs designated at T12 by this numbering system. Correlation with radiographs is recommended prior to any operative intervention.  Alignment: Mild grade 1 anterolisthesis at both L5-S1 and S1-S2 with exaggerated lower lumbar lordosis. Mild retrolisthesis of L2 on L3.  Vertebrae: Mild T12 superior endplate deformity with minimal associated superior endplate marrow edema (series 5, image  13). Normal marrow signal elsewhere in the T12 vertebral body.  Chronic degenerative appearing endplate marrow signal changes at L2-L3, and also anteriorly at L1-L2. Background bone marrow signal elsewhere within normal limits. No other No marrow edema or evidence of acute osseous abnormality. Intact visible sacrum and SI joints.  Conus medullaris and cauda equina: Conus extends to the L1-L2 level. There is subtle patchy T2 and STIR hyperintensity within the lower thoracic spinal cord seen at the T11-T12 level on both sagittal series 3, image 10 and axial series 6, image 1. Superimposed more central linear and intense signal abnormality in in the cord posterior to the T12 vertebral body could be superimposed prominence of the central spinal canal. Persistent central cord T2 hyperintensity continues to the conus as seen on series 6, image 4. No cord expansion. The cauda equina nerve roots appear normal.  Paraspinal and other soft tissues: Generalized bilateral patchy abnormal increased STIR signal in the bilateral posterior erector spinae muscles as seen on series 5, image 18 (on the left) and image 1 (on the right) similar indistinct bilateral medial psoas muscle signal abnormality as seen on series 5, image 17. The muscles do  not appear expanded. No intramuscular fluid collection.  Negative visualized abdominal viscera. Diverticulosis of the colon in the lower abdomen and pelvis.  Disc levels:  Age congruent lumbar spine degeneration at most levels. There is mild retrolisthesis at L2-L3 with advanced disc and endplate degeneration eccentric to the left, but no spinal stenosis. There is mild left lateral recess and left foraminal stenosis at that level.  There is moderate and severe L4-L5, L5-S1 and S1-S2 facet degeneration. Bilateral degenerative L5-S1 facet joint fluid. These changes contribute to mild bilateral L4 neural foraminal stenosis, and mild L5-S1 lateral  recess and foraminal stenosis. No significant spinal stenosis.  IMPRESSION: 1. Patchy abnormal signal in the visible lower spinal cord and conus is nonspecific. Are there symptoms of spinal cord infarct? There is no cord expansion to suggest tumor. The differential diagnosis would include transverse myelitis. Thoracic spine MRI without and with contrast may be valuable to further evaluate spinal cord. 2. Diffuse abnormal paraspinal muscle (bilateral psoas and erector spinae muscle) compatible with nonspecific myositis such as due to mild muscle edema or denervation changes. No intramuscular fluid collection. Perhaps this is related to #1. 3. No acute osseous abnormality. Exaggerated lumbar lordosis with multilevel mild spondylolisthesis and transitional lumbosacral anatomy. Chronic disc and endplate degeneration in the upper lumbar spine, and chronic posterior element degeneration in the lower lumbar spine but no spinal stenosis. 4. Diverticulosis of the large bowel in the lower abdomen and pelvis.        Brain MRI: FINDINGS: Brain: No acute infarction, hemorrhage, hydrocephalus, extra-axial collection or mass lesion. Generalized cortical atrophy and mild chronic microvascular ischemic change in the cerebral white matter.  Vascular: Major flow voids are preserved.  Skull and upper cervical spine: Negative for marrow lesion  Sinuses/Orbits: Chronic right maxillary sinusitis with inspissated secretions and atelectasis. Chronic mastoid opacification that is improved from CT 09/05/2017  IMPRESSION: 1. Senescent changes without acute finding including infarct. 2. Chronic right maxillary sinusitis. 3. Mastoid opacification that is improved from CT 09/05/2017.       All the patient's scans are reviewed in person.  The brain MRI shows mild atrophy and mild chronic microscopic ischemic changes.  Nothing acute is seen.  There is a questionable lacunar infarct involving  the right basal ganglia.  The lumbar spine shows multilevel disc degeneration and facet disease.  Of most concern is L1-L2 where there is posterior retrolisthesis of L1 on L2 causing moderate spinal canal it is disease/stenosis.  This level associated with marked arthritic changes.  The thoracic spine MRI shows syrinx and at two levels of moderate size.  A scout of the cervical spine shows no significant disease but this was only T1.       Tamlyn Sides A. Merlene Laughter, M.D.  Diplomate, Tax adviser of Psychiatry and Neurology ( Neurology). 12/12/2017, 11:57 PM

## 2017-12-13 DIAGNOSIS — M609 Myositis, unspecified: Secondary | ICD-10-CM | POA: Diagnosis present

## 2017-12-13 DIAGNOSIS — G950 Syringomyelia and syringobulbia: Secondary | ICD-10-CM | POA: Diagnosis present

## 2017-12-13 DIAGNOSIS — H409 Unspecified glaucoma: Secondary | ICD-10-CM | POA: Diagnosis present

## 2017-12-13 DIAGNOSIS — E860 Dehydration: Secondary | ICD-10-CM | POA: Diagnosis present

## 2017-12-13 DIAGNOSIS — I482 Chronic atrial fibrillation: Secondary | ICD-10-CM | POA: Diagnosis present

## 2017-12-13 DIAGNOSIS — Z955 Presence of coronary angioplasty implant and graft: Secondary | ICD-10-CM | POA: Diagnosis not present

## 2017-12-13 DIAGNOSIS — M069 Rheumatoid arthritis, unspecified: Secondary | ICD-10-CM | POA: Diagnosis present

## 2017-12-13 DIAGNOSIS — M1990 Unspecified osteoarthritis, unspecified site: Secondary | ICD-10-CM | POA: Diagnosis present

## 2017-12-13 DIAGNOSIS — R600 Localized edema: Secondary | ICD-10-CM | POA: Diagnosis present

## 2017-12-13 DIAGNOSIS — R001 Bradycardia, unspecified: Secondary | ICD-10-CM | POA: Diagnosis present

## 2017-12-13 DIAGNOSIS — R32 Unspecified urinary incontinence: Secondary | ICD-10-CM | POA: Diagnosis present

## 2017-12-13 DIAGNOSIS — R2681 Unsteadiness on feet: Secondary | ICD-10-CM | POA: Diagnosis not present

## 2017-12-13 DIAGNOSIS — I2510 Atherosclerotic heart disease of native coronary artery without angina pectoris: Secondary | ICD-10-CM | POA: Diagnosis present

## 2017-12-13 DIAGNOSIS — I495 Sick sinus syndrome: Secondary | ICD-10-CM | POA: Diagnosis not present

## 2017-12-13 DIAGNOSIS — I10 Essential (primary) hypertension: Secondary | ICD-10-CM | POA: Diagnosis present

## 2017-12-13 DIAGNOSIS — I472 Ventricular tachycardia: Secondary | ICD-10-CM | POA: Diagnosis present

## 2017-12-13 DIAGNOSIS — I444 Left anterior fascicular block: Secondary | ICD-10-CM | POA: Diagnosis present

## 2017-12-13 DIAGNOSIS — Y92003 Bedroom of unspecified non-institutional (private) residence as the place of occurrence of the external cause: Secondary | ICD-10-CM | POA: Diagnosis not present

## 2017-12-13 DIAGNOSIS — I340 Nonrheumatic mitral (valve) insufficiency: Secondary | ICD-10-CM | POA: Diagnosis not present

## 2017-12-13 DIAGNOSIS — I25118 Atherosclerotic heart disease of native coronary artery with other forms of angina pectoris: Secondary | ICD-10-CM | POA: Diagnosis not present

## 2017-12-13 DIAGNOSIS — I499 Cardiac arrhythmia, unspecified: Secondary | ICD-10-CM | POA: Diagnosis not present

## 2017-12-13 DIAGNOSIS — E7800 Pure hypercholesterolemia, unspecified: Secondary | ICD-10-CM | POA: Diagnosis present

## 2017-12-13 DIAGNOSIS — E039 Hypothyroidism, unspecified: Secondary | ICD-10-CM | POA: Diagnosis present

## 2017-12-13 DIAGNOSIS — R531 Weakness: Secondary | ICD-10-CM | POA: Diagnosis present

## 2017-12-13 DIAGNOSIS — H9190 Unspecified hearing loss, unspecified ear: Secondary | ICD-10-CM | POA: Diagnosis present

## 2017-12-13 DIAGNOSIS — R270 Ataxia, unspecified: Secondary | ICD-10-CM | POA: Diagnosis present

## 2017-12-13 DIAGNOSIS — D509 Iron deficiency anemia, unspecified: Secondary | ICD-10-CM | POA: Diagnosis present

## 2017-12-13 DIAGNOSIS — I4891 Unspecified atrial fibrillation: Secondary | ICD-10-CM | POA: Diagnosis not present

## 2017-12-13 DIAGNOSIS — Z66 Do not resuscitate: Secondary | ICD-10-CM | POA: Diagnosis present

## 2017-12-13 DIAGNOSIS — E785 Hyperlipidemia, unspecified: Secondary | ICD-10-CM | POA: Diagnosis present

## 2017-12-13 DIAGNOSIS — L03115 Cellulitis of right lower limb: Secondary | ICD-10-CM | POA: Diagnosis present

## 2017-12-13 DIAGNOSIS — I480 Paroxysmal atrial fibrillation: Secondary | ICD-10-CM | POA: Diagnosis not present

## 2017-12-13 DIAGNOSIS — I4892 Unspecified atrial flutter: Secondary | ICD-10-CM | POA: Diagnosis present

## 2017-12-13 DIAGNOSIS — W010XXA Fall on same level from slipping, tripping and stumbling without subsequent striking against object, initial encounter: Secondary | ICD-10-CM | POA: Diagnosis present

## 2017-12-13 LAB — BASIC METABOLIC PANEL
Anion gap: 10 (ref 5–15)
BUN: 8 mg/dL (ref 6–20)
CO2: 26 mmol/L (ref 22–32)
Calcium: 8.6 mg/dL — ABNORMAL LOW (ref 8.9–10.3)
Chloride: 100 mmol/L — ABNORMAL LOW (ref 101–111)
Creatinine, Ser: 0.76 mg/dL (ref 0.61–1.24)
GFR calc Af Amer: >60 mL/min (ref >60)
Glucose, Bld: 160 mg/dL — ABNORMAL HIGH (ref 65–99)
POTASSIUM: 3.9 mmol/L (ref 3.5–5.1)
SODIUM: 136 mmol/L (ref 135–145)

## 2017-12-13 LAB — HEPARIN LEVEL (UNFRACTIONATED): Heparin Unfractionated: 0.31 IU/mL (ref 0.30–0.70)

## 2017-12-13 LAB — CBC
HEMATOCRIT: 34.9 % — AB (ref 39.0–52.0)
HEMOGLOBIN: 10.8 g/dL — AB (ref 13.0–17.0)
MCH: 31.3 pg (ref 26.0–34.0)
MCHC: 30.9 g/dL (ref 30.0–36.0)
MCV: 101.2 fL — ABNORMAL HIGH (ref 78.0–100.0)
Platelets: 610 10*3/uL — ABNORMAL HIGH (ref 150–400)
RBC: 3.45 MIL/uL — AB (ref 4.22–5.81)
RDW: 14.9 % (ref 11.5–15.5)
WBC: 6.9 10*3/uL (ref 4.0–10.5)

## 2017-12-13 LAB — MAGNESIUM: MAGNESIUM: 1.8 mg/dL (ref 1.7–2.4)

## 2017-12-13 LAB — IGG CSF INDEX
ALBUMIN CSF-MCNC: 23 mg/dL (ref 11–48)
ALBUMIN: 2.2 g/dL — AB (ref 3.5–4.8)
CSF IgG Index: 0.4 (ref 0.0–0.7)
IGG (IMMUNOGLOBIN G), SERUM: 1610 mg/dL — AB (ref 700–1600)
IgG, CSF: 5.9 mg/dL (ref 0.0–8.6)
IgG/Alb Ratio, CSF: 0.26 — ABNORMAL HIGH (ref 0.00–0.25)

## 2017-12-13 NOTE — Progress Notes (Signed)
ANTICOAGULATION CONSULT NOTE  Pharmacy Consult for heparin Indication: atrial fibrillation  No Known Allergies  Patient Measurements: Height: 5\' 10"  (177.8 cm) Weight: 179 lb (81.2 kg) IBW/kg (Calculated) : 73 Heparin Dosing Weight: 81 kg  Vital Signs: Temp: 97.3 F (36.3 C) (12/20 0552) Temp Source: Oral (12/20 0552)  Labs: Recent Labs    12/10/17 0948 12/10/17 1754 12/10/17 2212 12/11/17 0422  12/11/17 1350 12/11/17 2013 12/12/17 0425 12/12/17 1309 12/12/17 2221 12/13/17 0424  HGB 9.9*  --   --   --   --   --   --  9.9*  --   --  10.8*  HCT 31.2*  --   --   --   --   --   --  31.4*  --   --  34.9*  PLT 572*  --   --   --   --   --   --  578*  --   --  610*  APTT  --   --   --   --   --  38* 38*  --   --   --   --   HEPARINUNFRC  --   --   --   --    < > 0.12* 0.12*  --   --  0.17* 0.31  CREATININE 0.77  --   --   --   --   --   --   --   --   --  0.76  CKTOTAL  --   --   --   --   --   --  75  --  79  --   --   CKMB  --   --   --   --   --   --  2.8  --   --   --   --   TROPONINI  --  <0.03 <0.03 <0.03  --   --   --   --   --   --   --    < > = values in this interval not displayed.   Estimated Creatinine Clearance: 81.1 mL/min (by C-G formula based on SCr of 0.76 mg/dL).  Assessment: 76 yo man who was on xarelto pta now to start heparin drip.  Last dose xarelto 12/16 at 10:00.  Heparin level below goal last PM Heparin drip increased to 1400 units/hr last night.  Heparin level therapeutic this AM.  Goal of Therapy:  Heparin level 0.3-0.7 units/ml Monitor platelets by anticoagulation protocol: Yes   Plan:  Continue Heparin drip at 1400 units/hr Check anti Xa level daily Monitor for bleeding complications  Pricilla Larsson 12/13/2017,9:01 AM

## 2017-12-13 NOTE — Progress Notes (Signed)
Night shift floor coverage note.  I was notified by the nursing staff about the patient having occasional Afib with SVR in the 30s with pauses while sleeping. He had one pause that was 3.19. His HR returns to the high 40s to 60s after these episodes. He is on timolol drops at home, but is not ordered on this hospital stay. There are no other negative chronotropic medication on his home meds.  Does this only happen while sleeping?  I will consult cardiology in a.m.  Tennis Must, MD

## 2017-12-13 NOTE — Progress Notes (Signed)
PROGRESS NOTE    [name]  QIO:[id number] DOB: March 30, 1941 DOA: 12/10/2017 PCP: Raylene Everts, MD    Brief Narrative:  [name] is a 76 y.o. male with medical history significant of atrial fibrillation, falls, HTN, RA, HLD, presented to the ED via EMS after a fall at home.    Patient reports he woke up and went into his bathroom and brushed his teeth. Patient reports he had a fall 2-3 weeks ago in which he fell face down on the sidewalk.  His legs started swelling at that time and Dr. Meda Coffee gave him medication to take care of this.  He does not recall the name of that medication but he says it was over the counter.  For about 2 weeks he reports the swelling stayed about the same and he had to use a cane to walk around.  He voices everything seemed fine until this am.  After brushing his teeth he went back into his bedroom to straighten the sheets and at one point he lost his balance and rolled back.  He fell backwards to his butt and attempted to get up.  He could not and so he crawled to his bedside table.  He attempted to use the table as leverage and still could not get up.  He had a life alert button and he pushed it to get help.   Patient reports he has had some incontinence today but then states he has been unable to urinate since this morning.  He denies loss of control of his bowels.  Patient states he lives alone but lives 5 minutes from his daughter who is currently in [place].  When reviewing patient's charts it appears that patient was seen on 11/27/2017 by his primary care physician who noted he was too weak to get up from a seated position as well as his unsteadiness with his gait.  It appears in her assessment and plan I discussed a referral to neurology as well as physical therapy even though he had just recently completed a course of physical therapy.  Dr. Meda Coffee also notes that patient sees a rheumatologist and she was going to call the rheumatologist to gain  insight into what could possibly be going on.  Patient denies that he has been unable to get up from a seated position and that he has been using a cane to walk.  He says that prior to the fall that he told me about previously he had been walking without concerns.  He denies fevers, chills, chest pain, chest pressure, lightheadedness, dizziness, shortness of breath, nausea, vomiting, diarrhea, constipation.  ED Course: Patient was seen by EDP.  Initial vital signs showed a respiratory rate of 14, pulse rate 55, blood pressure 122/59, and oxygen of 100% on room air.  Patient underwent CT of the head as well as MRI of the brain which showed no findings to explain his weakness.  He was unable to ambulate in the emergency department and was found to have right lower extremity erythema with concern for cellulitis.  Patient was started on IV vancomycin and [facility] was asked to admit for cellulitis and further evaluation of patient's ataxia.  Reviewing the bar spine ordered at time of admission neurology at [facility] was called for concerns for possible transverse myelitis.  Recommend thoracic spine MRI with and without contrast to further evaluate as well as a lumbar puncture.  Patient underwent both of these on day after discharge.  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  Further discussion with [facility] neurologist states that patient will not require high-dose steroids as likely this is a chronic slowly evolving process not to explain his current ataxia.  CSF fluid analysis was all within normal limits.  Neurologist at [facility] evaluated patient and stated that ataxia is likely multifactorial.  Did state we could try 2 doses of steroids to see if patient responded given his history of autoimmunity.  Overnight on 12/12/2017 to 12/13/2017 patient experienced few episodes of bradycardia while sleeping.  Discussed curbside with cardiology who recommend outpatient sleep study for  evaluation of obstructive sleep apnea.   Assessment & Plan:   Active Problems:   A-fib (HCC)   Essential hypertension   Hypothyroid   HLD (hyperlipidemia)   Ataxia   Unsteady gait   Cellulitis of right lower extremity   Ataxia and weakness -MRI of lumbar spine showing Patchy abnormal signal in the visible lower spinal cord and conus is nonspecific. Are there symptoms of spinal cord infarct? There is no cord expansion to suggest tumor. The differential diagnosis would include transverse myelitis. Thoracic spine MRI without and with contrast may be valuable to further evaluate spinal cord. Diffuse abnormal paraspinal muscle (bilateral psoas and erector spinae muscle) compatible with nonspecific myositis such as due to mild muscle edema or denervation changes. No intramuscular fluid collection. Perhaps this is related to #1. No acute osseous abnormality. Exaggerated lumbar lordosis with multilevel mild spondylolisthesis and transitional lumbosacral anatomy. Chronic disc and endplate degeneration in the upper lumbar spine, and chronic posterior element degeneration in the lower lumbar spine but no spinal stenosis. Diverticulosis of the large bowel in the lower abdomen and Pelvis. 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 No underlying cord compression spinal stenosis or mass lesion identified - will do two days of steroids but will. Mild thoracic disc and facet degeneration -Patient was not started on high-dose IV steroids as he is not severely or critically ill but will be started on dose of prednisone of 80 mg a day to monitor for signs of improvement -CK and CK-MB not elevated -Neurology following and appreciate their recommendations - 80mg  PO steroids yesterday and today - will likely discharge tomorrow to [facility] for short term physical therapy   Atrial fibrillation - telemetry -Heparin -Given patient's numerous recent falls at home  would hold chronic anticoagulation and defer to physician at skilled nursing facility to determine risks versus benefits at time of discharge from short-term rehab  HTN - continue lisinopril  Lower extremity edema -Patient was only taking Lasix as needed at home and per his daughter had not taken it for months -Continue IV Lasix  Cellulitis -We will need at least 2 more days of Keflex  HLD - continue statin  Anemia - patient iron deficient -Start p.o. iron    DVT prophylaxis: Heparin Code Status: DNR Family Communication: [name] with patient's daughter [name] Disposition Plan: Weekly discharge tomorrow to [facility]   Consultants:   Neurology  PT  OT  Procedures:   None  Antimicrobials:   keflex   Subjective: Patient seen and evaluated.  He is agreeable to go to a short-term rehab facility if his daughter encourages him to.  He is concerned about going home before Christmas.  Asks that his daughter be notified. Objective: Vitals:   12/12/17 2046 12/12/17 2111 12/13/17 0552 12/13/17 1138  BP:  Pulse:      Resp:  18 19   Temp:  98.2 F (36.8 C) (!) 97.3 F (36.3 C)   TempSrc:  Oral Oral   SpO2: 98%  100% 99%  Weight:      Height:        Intake/Output Summary (Last 24 hours) at 12/13/2017 1326 Last data filed at 12/13/2017 0651 Gross per 24 hour  Intake 520.67 ml  Output -  Net 520.67 ml   Filed Weights   12/10/17 0918 12/10/17 1652  Weight: 79.4 kg (175 lb) 81.2 kg (179 lb)    Examination:  General exam: Appears calm and comfortable  Respiratory system: Clear to auscultation. Respiratory effort normal. Cardiovascular system: S1 & S2 heard, irregularly irregular.  No JVD, murmurs, rubs, gallops or clicks.  2+ edema to the knees bilaterally Gastrointestinal system: Abdomen is nondistended, soft and nontender. No organomegaly or masses felt. Normal bowel sounds heard. Central nervous system: Alert and oriented.  Cranial nerves  II through XII are grossly intact Extremities: Increased strength with flexion at the hip bilaterally, 5 out of 5 strength with flexion and extension at knee bilaterally as well as dorsi and plantar flexion at the ankle bilaterally Skin: Proved erythema of the right lower extremity Psychiatry: Appropriate mood and affect, patient asking appropriate questions    Data Reviewed: I have personally reviewed following labs and imaging studies  CBC: Recent Labs  Lab 12/10/17 0948 12/12/17 0425 12/13/17 0424  WBC 10.5 8.6 6.9  NEUTROABS 8.1*  --   --   HGB 9.9* 9.9* 10.8*  HCT 31.2* 31.4* 34.9*  MCV 101.0* 100.6* 101.2*  PLT 572* 578* 818*   Basic Metabolic Panel: Recent Labs  Lab 12/10/17 0948 12/13/17 0424  NA 133* 136  K 4.3 3.9  CL 100* 100*  CO2 28 26  GLUCOSE 115* 160*  BUN 12 8  CREATININE 0.77 0.76  CALCIUM 8.2* 8.6*  MG  --  1.8   GFR: Estimated Creatinine Clearance: 81.1 mL/min (by C-G formula based on SCr of 0.76 mg/dL). Liver Function Tests: No results for input(s): AST, ALT, ALKPHOS, BILITOT, PROT, ALBUMIN in the last 168 hours. No results for input(s): LIPASE, AMYLASE in the last 168 hours. No results for input(s): AMMONIA in the last 168 hours. Coagulation Profile: No results for input(s): INR, PROTIME in the last 168 hours. Cardiac Enzymes: Recent Labs  Lab 12/10/17 1754 12/10/17 2212 12/11/17 0422 12/11/17 2013 12/12/17 1309  CKTOTAL  --   --   --  75 79  CKMB  --   --   --  2.8  --   TROPONINI <0.03 <0.03 <0.03  --   --    BNP (last 3 results) No results for input(s): PROBNP in the last 8760 hours. HbA1C: No results for input(s): HGBA1C in the last 72 hours. CBG: No results for input(s): GLUCAP in the last 168 hours. Lipid Profile: No results for input(s): CHOL, HDL, LDLCALC, TRIG, CHOLHDL, LDLDIRECT in the last 72 hours. Thyroid Function Tests: No results for input(s): TSH, T4TOTAL, FREET4, T3FREE, THYROIDAB in the last 72 hours. Anemia  Panel: No results for input(s): VITAMINB12, FOLATE, FERRITIN, TIBC, IRON, RETICCTPCT in the last 72 hours. Sepsis Labs: Recent Labs  Lab 12/10/17 0948  LATICACIDVEN 1.6    Recent Results (from the past 240 hour(s))  Culture, blood (routine x 2)     Status: None (Preliminary result)   Collection Time: 12/10/17  9:49 AM  Result Value Ref Range Status  Specimen Description BLOOD RIGHT ARM  Final   Special Requests   Final    BOTTLES DRAWN AEROBIC AND ANAEROBIC Blood Culture adequate volume   Culture NO GROWTH 3 DAYS  Final   Report Status PENDING  Incomplete  CSF culture     Status: None (Preliminary result)   Collection Time: 12/11/17  3:40 PM  Result Value Ref Range Status   Specimen Description CSF  Final   Special Requests NONE  Final   Gram Stain   Final    NO ORGANISMS SEEN Performed at [facility]    Culture   Final    NO GROWTH 2 DAYS Performed at [facility], [place] [address] [place], [place] [address]    Report Status PENDING  Incomplete         Radiology Studies: Mr Thoracic Spine W Wo Contrast  Result Date: 12/11/2017 CLINICAL DATA:  Ataxia EXAM: MRI THORACIC WITHOUT AND WITH CONTRAST TECHNIQUE: Multiplanar and multiecho pulse sequences of the thoracic spine were obtained without and with intravenous contrast. CONTRAST:  9mL MULTIHANCE GADOBENATE DIMEGLUMINE 529 MG/ML IV SOLN COMPARISON:  None. FINDINGS: MRI THORACIC SPINE FINDINGS Alignment:  Normal Vertebrae: Mild chronic fracture superior endplate of G25. No acute fracture or mass. Cord: Thoracic cord syrinx. The largest syrinx measures 2.5 mm at T6. Smaller syrinx extends down to T8. Small syrinx again at T12. No cord mass or enhancing lesion identified. Paraspinal and other soft tissues: Negative Disc levels: No significant spinal stenosis. Mild facet degeneration in the mid and lower thoracic spine. No focal disc protrusion. 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 No underlying cord compression spinal stenosis or mass lesion identified Mild thoracic disc and facet degeneration. Electronically Signed   By: Franchot Gallo M.D.   On: 12/11/2017 14:59   Dg Fluoro Guide Lumbar Puncture  Result Date: 12/11/2017 CLINICAL DATA:  Ataxia, cord lesions on thoracic and lumbar MRI examinations. EXAM: DIAGNOSTIC LUMBAR PUNCTURE UNDER FLUOROSCOPIC GUIDANCE FLUOROSCOPY TIME:  Fluoroscopy Time:  0 minutes, 30 seconds Radiation Exposure Index (if provided by the fluoroscopic device): 3.2 mGy Number of Acquired Spot Images: 0 PROCEDURE: I discussed the risks (including hemorrhage, infection, headache, and nerve damage, among others), benefits, and alternatives to fluoroscopically guided lumbar puncture with the patient. We specifically discussed the high technical likelihood of success of the procedure. The patient understood and elected to undergo the procedure. Standard time-out was employed. Following sterile skin prep and local anesthetic administration consisting of 1 percent lidocaine, a 22 gauge spinal needle was advanced without difficulty into the thecal sac at the at the L3-4 level. I elected to use a slight paramedian approach due to the very small amount of interspinous space appreciable on the lumbar MRI. Clear CSF was returned. Opening pressure was not directly measured due to projected difficulty in turning the patient, but did not seem elevated given the sluggish flow of CSF. 11 cc of clear CSF was collected. The needle was subsequently removed and the skin cleansed and bandaged. No immediate complications were observed. IMPRESSION: 1. Technically successful lumbar puncture at the L3-4 level, yielding 11 cc of clear CSF sent to the lab for requested analyses. Electronically Signed   By: Van Clines M.D.   On: 12/11/2017 16:35        Scheduled Meds: . cephALEXin  500 mg Oral Q6H  . furosemide  40 mg Intravenous  Daily  Continuous Infusions: . heparin 1,400 Units/hr (12/13/17 1048)     LOS: 0 days    Time spent:25  minutes    Loretha Stapler, MD [facility] Pager [phone]  If 7PM-7AM, please contact night-coverage [email or web address] Password [email or web address] 12/13/2017, 1:26 PM

## 2017-12-13 NOTE — Progress Notes (Signed)
Patient having more episodes of SVR, HR of 30s per tele. HR quickly returns to 50s-60s. Patient sleeping. Previous episodes included pause of less than 3 seconds. Most recent episode included pause of 3.19 seconds. Olevia Bowens, MD notified.

## 2017-12-13 NOTE — Clinical Social Work Note (Signed)
LCSW following. Pt accepted for rehab at [facility]. They are working on [facility] broker. Pt will likely dc tomorrow. Updated pt, daughter, and RN CM. Will follow up in AM.

## 2017-12-13 NOTE — Progress Notes (Signed)
Patient's BP slightly low this AM, even lower when standing. Patient stated that he did have a little bit of dizziness. Will continue to monitor.

## 2017-12-13 NOTE — Progress Notes (Signed)
Per tele, patient has had a couple episodes of A-fib with SVR, HR in the 30s. HR increases back to 50s-60s. Patient sleeping during these episodes. No distress noted. Schorr, NP notified.

## 2017-12-13 NOTE — Progress Notes (Signed)
Physical Therapy Treatment Patient Details Name: [name] MRN: [id number] DOB: 16-Apr-1941 Today's Date: 12/13/2017    History of Present Illness [name] is a 76 y.o. male with medical history significant of atrial fibrillation, falls, HTN, RA, HLD, presented to the ED via EMS after a fall at home.    Patient reports he woke up and went into his bathroom and brushed his teeth. Patient reports he had a fall 2-3 weeks ago in which he fell face down on the sidewalk.  His legs started swelling at that time and Dr. Meda Coffee gave him medication to take care of this.  He does not recall the name of that medication but he says it was over the counter.  For about 2 weeks he reports the swelling stayed about the same and he had to use a cane to walk around.  He voices everything seemed fine until this am.  After brushing his teeth he went back into his bedroom to straighten the sheets and at one point he lost his balance and rolled back.  He fell backwards to his butt and attempted to get up.  He could not and so he crawled to his bedside table.  He attempted to use the table as leverage and still could not get up.  He had a life alert button and he pushed it to get help.     PT Comments    Patient received verbal cueing to keep feet behind knees, lean forward for sit to stands with fair/good return and improved balance demonstrated.  Patient mostly limited for sit to stands due to limited left knee flexion, can only flex to approximately 95 degrees due to tight end feel at end range.  Patient instructed to at least flex right knee, lean forward and push off RLE for sit to stands with understanding acknowledged.  Patient will benefit from continued physical therapy in hospital and recommended venue below to increase strength, balance, endurance for safe ADLs and gait.   Follow Up Recommendations  SNF; supervision-intermittent     Equipment Recommendations  Rolling walker with 5" wheels     Recommendations for Other Services       Precautions / Restrictions Precautions Precautions: Fall Precaution Comments: 3 falls in past month Restrictions Weight Bearing Restrictions: No    Mobility  Bed Mobility Overal bed mobility: Independent                Transfers Overall transfer level: Needs assistance Equipment used: Straight cane Transfers: Sit to/from Stand;Stand Pivot Transfers Sit to Stand: Supervision Stand pivot transfers: Modified independent (Device/Increase time)       General transfer comment: verbal cues to keep feet behind knees and lean forward for sit to stands with fair/good return demonstrated, 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  Ambulation/Gait Ambulation/Gait assistance: Supervision Ambulation Distance (Feet): 150 Feet Assistive device: Straight cane Gait Pattern/deviations: Step-through pattern;Decreased stride length;Decreased step length - right;Decreased step length - left   Gait velocity interpretation: Below normal speed for age/gender General Gait Details: demonstrates good return for using SPC without loss of balance, 2 point gait pattern using SPC   Stairs            Wheelchair Mobility    Modified Rankin (Stroke Patients Only)       Balance Overall balance assessment: Needs assistance Sitting-balance support: No upper extremity supported;Feet supported Sitting balance-Leahy Scale: Good     Standing balance support: Single extremity supported;During functional activity Standing balance-Leahy  Scale: Fair Standing balance comment: fair/good with SPC                            Cognition Arousal/Alertness: Awake/alert Behavior During Therapy: WFL for tasks assessed/performed Overall Cognitive Status: Within Functional Limits for tasks assessed                                        Exercises General Exercises - Lower Extremity Long Arc Quad:  Seated;AROM;Strengthening;Both;10 reps Hip Flexion/Marching: Seated;AROM;Strengthening;Both;10 reps Toe Raises: Seated;AROM;Strengthening;Both;10 reps Heel Raises: Seated;AROM;Strengthening;10 reps;Both Mini-Sqauts: AROM    General Comments        Pertinent Vitals/Pain Pain Assessment: No/denies pain    Home Living                      Prior Function            PT Goals (current goals can now be found in the care plan section) Acute Rehab PT Goals Patient Stated Goal: return home  PT Goal Formulation: With patient Time For Goal Achievement: 12/15/17 Potential to Achieve Goals: Good Progress towards PT goals: Progressing toward goals    Frequency    Min 3X/week      PT Plan Current plan remains appropriate    Co-evaluation              AM-PAC PT "6 Clicks" Daily Activity  Outcome Measure  Difficulty turning over in bed (including adjusting bedclothes, sheets and blankets)?: None Difficulty moving from lying on back to sitting on the side of the bed? : None Difficulty sitting down on and standing up from a chair with arms (e.g., wheelchair, bedside commode, etc,.)?: A Little Help needed moving to and from a bed to chair (including a wheelchair)?: A Little Help needed walking in hospital room?: A Little Help needed climbing 3-5 steps with a railing? : A Little 6 Click Score: 20    End of Session   Activity Tolerance: Patient tolerated treatment well Patient left: in chair;with call bell/phone within reach Nurse Communication: Mobility status PT Visit Diagnosis: Unsteadiness on feet (R26.81);Other abnormalities of gait and mobility (R26.89);Muscle weakness (generalized) (M62.81)     Time: 1025-8527 PT Time Calculation (min) (ACUTE ONLY): 33 min  Charges:  $Gait Training: 8-22 mins $Therapeutic Exercise: 8-22 mins                    G Codes:       11:24 AM, 2017-12-16 Lonell Grandchild, MPT Physical Therapist with [facility] 336 [phone] office [phone] mobile phone

## 2017-12-14 ENCOUNTER — Inpatient Hospital Stay (HOSPITAL_COMMUNITY): Payer: Medicare PPO

## 2017-12-14 DIAGNOSIS — I499 Cardiac arrhythmia, unspecified: Secondary | ICD-10-CM

## 2017-12-14 DIAGNOSIS — R001 Bradycardia, unspecified: Secondary | ICD-10-CM | POA: Diagnosis present

## 2017-12-14 DIAGNOSIS — I4729 Other ventricular tachycardia: Secondary | ICD-10-CM

## 2017-12-14 DIAGNOSIS — I10 Essential (primary) hypertension: Secondary | ICD-10-CM

## 2017-12-14 DIAGNOSIS — G950 Syringomyelia and syringobulbia: Secondary | ICD-10-CM | POA: Diagnosis present

## 2017-12-14 DIAGNOSIS — I25118 Atherosclerotic heart disease of native coronary artery with other forms of angina pectoris: Secondary | ICD-10-CM

## 2017-12-14 DIAGNOSIS — I482 Chronic atrial fibrillation: Secondary | ICD-10-CM

## 2017-12-14 DIAGNOSIS — I472 Ventricular tachycardia: Secondary | ICD-10-CM

## 2017-12-14 DIAGNOSIS — E785 Hyperlipidemia, unspecified: Secondary | ICD-10-CM

## 2017-12-14 DIAGNOSIS — I480 Paroxysmal atrial fibrillation: Secondary | ICD-10-CM

## 2017-12-14 DIAGNOSIS — R2681 Unsteadiness on feet: Secondary | ICD-10-CM

## 2017-12-14 DIAGNOSIS — R270 Ataxia, unspecified: Secondary | ICD-10-CM

## 2017-12-14 DIAGNOSIS — Z955 Presence of coronary angioplasty implant and graft: Secondary | ICD-10-CM

## 2017-12-14 DIAGNOSIS — I340 Nonrheumatic mitral (valve) insufficiency: Secondary | ICD-10-CM

## 2017-12-14 DIAGNOSIS — I495 Sick sinus syndrome: Secondary | ICD-10-CM

## 2017-12-14 DIAGNOSIS — L03115 Cellulitis of right lower limb: Secondary | ICD-10-CM

## 2017-12-14 LAB — ECHOCARDIOGRAM COMPLETE
CHL CUP MV DEC (S): 176
CHL CUP STROKE VOLUME: 51 mL
E decel time: 176 msec
FS: 12 % — AB (ref 28–44)
HEIGHTINCHES: 70 in
IVS/LV PW RATIO, ED: 1.01
LA ID, A-P, ES: 45 mm
LA diam end sys: 45 mm
LADIAMINDEX: 2.24 cm/m2
LAVOL: 119 mL
LAVOLA4C: 141 mL
LAVOLIN: 59.1 mL/m2
LV PW d: 10.2 mm — AB (ref 0.6–1.1)
LV SIMPSON'S DISK: 58
LV dias vol index: 44 mL/m2
LV dias vol: 89 mL (ref 62–150)
LVOT VTI: 20.6 cm
LVOT area: 4.15 cm2
LVOT peak grad rest: 4 mmHg
LVOT peak vel: 95.1 cm/s
LVOTD: 23 mm
LVOTSV: 85 mL
LVSYSVOL: 37 mL (ref 21–61)
LVSYSVOLIN: 18 mL/m2
MV pk E vel: 103 m/s
MVPG: 4 mmHg
RV TAPSE: 17.7 mm
WEIGHTICAEL: 2864 [oz_av]

## 2017-12-14 LAB — BASIC METABOLIC PANEL
ANION GAP: 10 (ref 5–15)
BUN: 13 mg/dL (ref 6–20)
CHLORIDE: 102 mmol/L (ref 101–111)
CO2: 24 mmol/L (ref 22–32)
Calcium: 8.5 mg/dL — ABNORMAL LOW (ref 8.9–10.3)
Creatinine, Ser: 0.67 mg/dL (ref 0.61–1.24)
Glucose, Bld: 123 mg/dL — ABNORMAL HIGH (ref 65–99)
POTASSIUM: 3.6 mmol/L (ref 3.5–5.1)
SODIUM: 136 mmol/L (ref 135–145)

## 2017-12-14 LAB — CBC
HEMATOCRIT: 30.7 % — AB (ref 39.0–52.0)
HEMOGLOBIN: 9.5 g/dL — AB (ref 13.0–17.0)
MCH: 31.1 pg (ref 26.0–34.0)
MCHC: 30.9 g/dL (ref 30.0–36.0)
MCV: 100.7 fL — ABNORMAL HIGH (ref 78.0–100.0)
Platelets: 551 10*3/uL — ABNORMAL HIGH (ref 150–400)
RBC: 3.05 MIL/uL — AB (ref 4.22–5.81)
RDW: 14.6 % (ref 11.5–15.5)
WBC: 14.8 10*3/uL — AB (ref 4.0–10.5)

## 2017-12-14 LAB — TSH: TSH: 2.791 u[IU]/mL (ref 0.350–4.500)

## 2017-12-14 LAB — HEPARIN LEVEL (UNFRACTIONATED): HEPARIN UNFRACTIONATED: 0.35 [IU]/mL (ref 0.30–0.70)

## 2017-12-14 LAB — MAGNESIUM: MAGNESIUM: 2.7 mg/dL — AB (ref 1.7–2.4)

## 2017-12-14 MED ORDER — MAGNESIUM OXIDE -MG SUPPLEMENT 200 MG PO TABS: 1.0000 | ORAL_TABLET | Freq: Every day | ORAL | 0 refills | Status: DC

## 2017-12-14 MED ORDER — FUROSEMIDE 40 MG PO TABS: 40.0000 mg | ORAL_TABLET | Freq: Every day | ORAL | 0 refills | Status: DC

## 2017-12-14 MED ORDER — FERROUS SULFATE 325 (65 FE) MG PO TABS: 325.0000 mg | ORAL_TABLET | Freq: Two times a day (BID) | ORAL | 0 refills | Status: DC

## 2017-12-14 MED ORDER — PERFLUTREN LIPID MICROSPHERE
1.0000 mL | INTRAVENOUS | Status: AC | PRN
  Administered 2017-12-14 (×2): 2 mL via INTRAVENOUS
  Filled 2017-12-14: qty 10

## 2017-12-14 MED ORDER — MAGNESIUM SULFATE 2 GM/50ML IV SOLN
2.0000 g | Freq: Once | INTRAVENOUS | Status: AC
  Administered 2017-12-14: 2 g via INTRAVENOUS
  Filled 2017-12-14: qty 50

## 2017-12-14 MED ORDER — CEPHALEXIN 500 MG PO CAPS: 500.0000 mg | ORAL_CAPSULE | Freq: Four times a day (QID) | ORAL | 0 refills | Status: AC

## 2017-12-14 MED ORDER — POTASSIUM CHLORIDE CRYS ER 20 MEQ PO TBCR: 40.0000 meq | EXTENDED_RELEASE_TABLET | Freq: Every day | ORAL | 0 refills | Status: DC

## 2017-12-14 NOTE — Consult Note (Signed)
CARDIOLOGY CONSULT NOTE    Patient ID: [name]; [id number]; 08-Apr-1941   Admit date: 12/10/2017 Date of Consult: 12/14/2017  Primary Care Provider: Raylene Everts, MD Primary Cardiologist: Derrick Sable, MD  Patient Profile:   [name] is a 76 y.o. male with a hx of coronary artery disease, remote angioplasty and stenting to unknown artery, atrial fibrillation, on Xarelto, intolerant to beta-blockers due to bradycardia and falls, chronic lower extremity edema, who is being seen today for the evaluation of ventricular tachycardia during sleeping, at the request of [name], Hospitalist Service.   History of Present Illness:   [name] resented to the emergency room secondary to fall at home.  Apparently this is not new, as he has a history of frequent falls, and gait instability ;most recent 2-3 weeks ago where he face down on the sidewalk.  Morning of admission, the patient was making his bed, when he felt himself "fading back", leaned up against a dresser and slid down to the floor.  He was unable to get up on his own.  He crawled to a bedside table but did not have the strength to stand up.  He reports he pushed his [place] alert button.  When EMS came they brought him to ER.  He denies presyncope, dizziness, palpitations, heart racing, chest pain, or shortness of breath.  On arrival to the emergency room blood pressure was 130/69, heart rate 75, O2 sat 99%, he was afebrile.  EKG revealed atrial fib flutter, left anterior fascicular block.  Labs revealed sodium of 133, creatinine 0.77, potassium 4.3, proBNP 258.0.  Was found to be anemic with a hemoglobin of 9.9, hematocrit of 31.2, white blood cells 10.5, platelets 572.  Magnesium 1.8.  MRI revealed chronic right maxillary sinusitis, no evidence of acute infarction hemorrhage or hydrocephalus.  CT scan revealed no acute abnormality.  X-ray was negative for pneumonia CHF, cardiomegaly was noted.  He was found  to have lower extremity edema with right lower extremity erythema, and is now being treated for cellulitis.  He was admitted for further evaluation of ataxia.  He was started on IV vancomycin.  He has been given magnesium.  He has been  taken off of Xarelto, and is currently on IV heparin.  He was noted on telemetry on 12/13/2017 around 1:30 in the afternoon to have a 5 beat run of V. tach, also review of telemetry reveals multiple PVCs, bradycardia, some pauses none greater than 1.5 seconds.  Frequent ventricular ectopy.  Some junctional.  Past Medical History:  Diagnosis Date  . A-fib ([place])   . Arthritis    osteoarthritis  . Basal cell carcinoma ([facility]CC) 08/29/2017  . Cataract   . Frequent falls   . Glaucoma   . High cholesterol   . Hypertension   . MI (myocardial infarction) ([place])   . Rheumatoid arthritis ([place])   . Thyroid disease     Past Surgical History:  Procedure Laterality Date  . CORONARY ANGIOPLASTY WITH STENT PLACEMENT    . JOINT REPLACEMENT     left knee  . REPLACEMENT TOTAL KNEE Left      Home Medications:  Prior to Admission medications   Medication Sig Start Date End Date Taking? Authorizing Provider  acetaminophen (TYLENOL) 500 MG tablet Take 1,000 mg by mouth every 6 (six) hours as needed.   Yes [provider]  folic acid (FOLVITE) 1 MG tablet Take 1 tablet (1 mg total) by mouth daily. 04/30/17  Yes Blanchie Serve  Collie Siad, MD  furosemide (LASIX) 40 MG tablet Take 1 tablet (40 mg total) by mouth daily as needed. 08/16/17 12/10/17 Yes Lendon Colonel, NP  inFLIXimab (REMICADE) 100 MG injection Inject into the vein. 02/24/15  Yes [provider]  levothyroxine (SYNTHROID, LEVOTHROID) 75 MCG tablet Take 1 tablet (75 mcg total) by mouth daily before breakfast. 04/30/17  Yes Derrick Everts, MD  lisinopril (PRINIVIL,ZESTRIL) 2.5 MG tablet Take 1 tablet (2.5 mg total) by mouth daily. 08/16/17 12/10/17 Yes Lendon Colonel, NP  methotrexate 2.5 MG tablet  Take 15 mg by mouth every Sunday. 6 tablets on Sunday   Yes [provider]  polycarbophil (FIBERCON) 625 MG tablet Take 625 mg by mouth at bedtime.   Yes [provider]  potassium chloride SA (K-DUR,KLOR-CON) 20 MEQ tablet Tale Potassium 20 meq daily for 5 days and then daily as NEEDED for leg swelling Patient taking differently: Take 20 mEq by mouth daily as needed. as NEEDED for leg swelling 04/16/17  Yes Herminio Commons, MD  rivaroxaban (XARELTO) 20 MG TABS tablet Take 1 tablet (20 mg total) by mouth daily with supper. Patient taking differently: Take 20 mg by mouth every morning.  04/16/17  Yes Herminio Commons, MD  simvastatin (ZOCOR) 40 MG tablet Take 1 tablet (40 mg total) by mouth daily. 04/30/17  Yes Derrick Everts, MD  timolol (TIMOPTIC) 0.5 % ophthalmic solution INSTILL ONE DROP INTO EACH EYE TWICE DAILY 12/15/13  Yes [provider]  neomycin-polymyxin-hydrocortisone (CORTISPORIN) OTIC solution Place 3 drops into both ears 4 (four) times daily. Patient not taking: Reported on 12/10/2017 08/06/17   Derrick Everts, MD    Inpatient Medications: Scheduled Meds: . cephALEXin  500 mg Oral Q6H  . furosemide  40 mg Intravenous Daily   Continuous Infusions: . heparin 1,400 Units/hr (12/14/17 0513)   PRN Meds: acetaminophen **OR** acetaminophen, ondansetron **OR** ondansetron (ZOFRAN) IV, polyethylene glycol  Allergies:   No Known Allergies  Social History:   Social History   Socioeconomic History  . Marital status: Widowed    Spouse name: Not on file  . Number of children: 1  . Years of education: 25  . Highest education level: Not on file  Social Needs  . Financial resource strain: Not on file  . Food insecurity - worry: Not on file  . Food insecurity - inability: Not on file  . Transportation needs - medical: Not on file  . Transportation needs - non-medical: Not on file  Occupational History  . Occupation: retired    Comment:  Psychologist, counselling and first aid equip  Tobacco Use  . Smoking status: Former Smoker    Years: 15.00    Types: Cigarettes    Last attempt to quit: 12/26/1975    Years since quitting: 41.9  . Smokeless tobacco: Never Used  Substance and Sexual Activity  . Alcohol use: No  . Drug use: No  . Sexual activity: Not Currently  Other Topics Concern  . Not on file  Social History Narrative   Forensic psychologist    Widow   Lives alone   Lives near [name]/daughter    Family History:    Family History  Problem Relation Age of Onset  . Heart attack Father 7  . Early death Father   . Alzheimer's disease Mother 28  . Early death Sister        MVA  . Hyperlipidemia Brother   . Heart disease Brother  ROS:  Please see the history of present illness.  ROS  All other ROS reviewed and negative.     Physical Exam/Data:   Vitals:   12/13/17 0552 12/13/17 1138 12/13/17 1503 12/13/17 2132  BP:   109/62 110/64  Pulse:   66 60  Resp: 19  18 18   Temp: (!) 97.3 F (36.3 C)  98.2 F (36.8 C) 98.4 F (36.9 C)  TempSrc: Oral   Oral  SpO2: 100% 99% 98% 97%  Weight:      Height:        Intake/Output Summary (Last 24 hours) at 12/14/2017 1040 Last data filed at 12/13/2017 2317 Gross per 24 hour  Intake 359.47 ml  Output 150 ml  Net 209.47 ml   Filed Weights   12/10/17 0918 12/10/17 1652  Weight: 175 lb (79.4 kg) 179 lb (81.2 kg)   Body mass index is 25.68 kg/m.  General:  Well nourished, well developed, in no acute distress.  HEENT: normal Lymph: no adenopathy Neck: no JVD Endocrine:  No thryomegaly Vascular: No carotid bruits; FA pulses 2+ bilaterally without bruits  Cardiac:  normal S1, S2; IRRR; no murmur Lungs:  Clear to auscultation bilaterally, no wheezing, rhonchi or rales  Abd: soft, nontender, no hepatomegaly  Ext: no edema Musculoskeletal:  No deformities, BUE and BLE strength normal and equal Skin: warm and dry  Neuro:  CNs 2-12 intact, no focal abnormalities  noted Psych:  Normal affect   EKG:  The EKG was personally reviewed and demonstrates: Atrial fib flutter, heart rate 80 bpm. Telemetry:  Telemetry was personally reviewed and demonstrates: Multiple ventricular ectopy, atrial fib, multifocal PVCs, pauses none greater than 1.5.,  2 episodes of VT, menses 3 beat run and 5 beat run) noted on 01/13/2017.  Relevant CV Studies: Echocardiogram (transcribed from scanned document from [facility] dated 10/2016)  1.  Rhythm atrial flutter with slow ventricular rate 2.  Mildly thickened aortic valve leaflets with trace aortic regurgitation 3.  Trace mitral regurg 4.  Mild tricuspid regurg with top normal pulmonary artery pressure 5.  Moderately dilated left atrium 6.  Normal left ventricular size, wall thickness, wall motion, and systolic function.  Laboratory Data:  Chemistry Recent Labs  Lab 12/10/17 0948 12/13/17 0424 12/14/17 0420  NA 133* 136 136  K 4.3 3.9 3.6  CL 100* 100* 102  CO2 28 26 24   GLUCOSE 115* 160* 123*  BUN 12 8 13   CREATININE 0.77 0.76 0.67  CALCIUM 8.2* 8.6* 8.5*  GFRNONAA >60 >60 >60  GFRAA >60 >60 >60  ANIONGAP 5 10 10     Recent Labs  Lab 12/11/17 1317  ALBUMIN 2.2*   Hematology Recent Labs  Lab 12/12/17 0425 12/13/17 0424 12/14/17 0420  WBC 8.6 6.9 14.8*  RBC 3.12* 3.45* 3.05*  HGB 9.9* 10.8* 9.5*  HCT 31.4* 34.9* 30.7*  MCV 100.6* 101.2* 100.7*  MCH 31.7 31.3 31.1  MCHC 31.5 30.9 30.9  RDW 15.2 14.9 14.6  PLT 578* 610* 551*   Cardiac Enzymes Recent Labs  Lab 12/10/17 1754 12/10/17 2212 12/11/17 0422  TROPONINI <0.03 <0.03 <0.03   No results for input(s): TROPIPOC in the last 168 hours.  BNP Recent Labs  Lab 12/10/17 0957  BNP 258.0*    DDimer No results for input(s): DDIMER in the last 168 hours.  Radiology/Studies:  Dg Chest 2 View  Result Date: 12/10/2017 CLINICAL DATA:  Weakness for 2-4 weeks, worsening. EXAM: CHEST  2 VIEW COMPARISON:  None.  FINDINGS:  There is cardiomegaly without edema. Lungs are clear. No pneumothorax or pleural effusion. Aortic atherosclerosis is noted. No acute bony abnormality. Remote fracture of the distal right clavicle is noted. IMPRESSION: Cardiomegaly without acute disease. Atherosclerosis. Electronically Signed   By: Inge Rise M.D.   On: 12/10/2017 10:59   Ct Head Wo Contrast  Result Date: 12/10/2017 CLINICAL DATA:  The patient suffered an episode of dizziness with a fall this morning. EXAM: CT HEAD WITHOUT CONTRAST TECHNIQUE: Contiguous axial images were obtained from the base of the skull through the vertex without intravenous contrast. COMPARISON:  Head CT scan 05/18/2017. FINDINGS: Brain: There is mild atrophy and some chronic microvascular ischemic change. No evidence of acute abnormality including hemorrhage, infarct, mass lesion, mass effect, midline shift or abnormal extra-axial fluid collection. No hydrocephalus or pneumocephalus. Vascular: Atherosclerosis noted. Skull: Intact. Sinuses/Orbits: There is partial visualization of marked mucosal thickening in the right maxillary sinus with associated wall thickening consistent with chronic change. This finding was present on the prior exam. Other: None. IMPRESSION: No acute abnormality. Mild atrophy and chronic microvascular change. Chronic right maxillary sinus disease. Electronically Signed   By: Inge Rise M.D.   On: 12/10/2017 11:16   Mr Brain Wo Contrast (neuro Protocol)  Result Date: 12/10/2017 CLINICAL DATA:  Ataxia with stroke suspected. EXAM: MRI HEAD WITHOUT CONTRAST TECHNIQUE: Multiplanar, multiecho pulse sequences of the brain and surrounding structures were obtained without intravenous contrast. COMPARISON:  Head CT from earlier today FINDINGS: Brain: No acute infarction, hemorrhage, hydrocephalus, extra-axial collection or mass lesion. Generalized cortical atrophy and mild chronic microvascular ischemic change in the cerebral white matter.  Vascular: Major flow voids are preserved. Skull and upper cervical spine: Negative for marrow lesion Sinuses/Orbits: Chronic right maxillary sinusitis with inspissated secretions and atelectasis. Chronic mastoid opacification that is improved from CT 09/05/2017 IMPRESSION: 1. Senescent changes without acute finding including infarct. 2. Chronic right maxillary sinusitis. 3. Mastoid opacification that is improved from CT 09/05/2017. Electronically Signed   By: Monte Fantasia M.D.   On: 12/10/2017 12:20   Mr Lumbar Spine Wo Contrast  Result Date: 12/11/2017 CLINICAL DATA:  76 year old male with progressive weakness for 2-4 weeks. Fall in April. EXAM: MRI LUMBAR SPINE WITHOUT CONTRAST TECHNIQUE: Multiplanar, multisequence MR imaging of the lumbar spine was performed. No intravenous contrast was administered. COMPARISON:  Lumbar radiographs 04/01/2017. FINDINGS: Segmentation: Transitional anatomy suspected with lumbarized S1 level based on the appearance of the April lumbar radiographs. Full size ribs designated at T12 by this numbering system. Correlation with radiographs is recommended prior to any operative intervention. Alignment: Mild grade 1 anterolisthesis at both L5-S1 and S1-S2 with exaggerated lower lumbar lordosis. Mild retrolisthesis of L2 on L3. Vertebrae: Mild T12 superior endplate deformity with minimal associated superior endplate marrow edema (series 5, image 13). Normal marrow signal elsewhere in the T12 vertebral body. Chronic degenerative appearing endplate marrow signal changes at L2-L3, and also anteriorly at L1-L2. Background bone marrow signal elsewhere within normal limits. No other No marrow edema or evidence of acute osseous abnormality. Intact visible sacrum and SI joints. Conus medullaris and cauda equina: Conus extends to the L1-L2 level. There is subtle patchy T2 and STIR hyperintensity within the lower thoracic spinal cord seen at the T11-T12 level on both sagittal series 3, image  10 and axial series 6, image 1. Superimposed more central linear and intense signal abnormality in in the cord posterior to the T12 vertebral body could be superimposed prominence of the central spinal canal. Persistent  central cord T2 hyperintensity continues to the conus as seen on series 6, image 4. No cord expansion. The cauda equina nerve roots appear normal. Paraspinal and other soft tissues: Generalized bilateral patchy abnormal increased STIR signal in the bilateral posterior erector spinae muscles as seen on series 5, image 18 (on the left) and image 1 (on the right) similar indistinct bilateral medial psoas muscle signal abnormality as seen on series 5, image 17. The muscles do not appear expanded. No intramuscular fluid collection. Negative visualized abdominal viscera. Diverticulosis of the colon in the lower abdomen and pelvis. Disc levels: Age congruent lumbar spine degeneration at most levels. There is mild retrolisthesis at L2-L3 with advanced disc and endplate degeneration eccentric to the left, but no spinal stenosis. There is mild left lateral recess and left foraminal stenosis at that level. There is moderate and severe L4-L5, L5-S1 and S1-S2 facet degeneration. Bilateral degenerative L5-S1 facet joint fluid. These changes contribute to mild bilateral L4 neural foraminal stenosis, and mild L5-S1 lateral recess and foraminal stenosis. No significant spinal stenosis. IMPRESSION: 1. Patchy abnormal signal in the visible lower spinal cord and conus is nonspecific. Are there symptoms of spinal cord infarct? There is no cord expansion to suggest tumor. The differential diagnosis would include transverse myelitis. Thoracic spine MRI without and with contrast may be valuable to further evaluate spinal cord. 2. Diffuse abnormal paraspinal muscle (bilateral psoas and erector spinae muscle) compatible with nonspecific myositis such as due to mild muscle edema or denervation changes. No intramuscular fluid  collection. Perhaps this is related to #1. 3. No acute osseous abnormality. Exaggerated lumbar lordosis with multilevel mild spondylolisthesis and transitional lumbosacral anatomy. Chronic disc and endplate degeneration in the upper lumbar spine, and chronic posterior element degeneration in the lower lumbar spine but no spinal stenosis. 4. Diverticulosis of the large bowel in the lower abdomen and pelvis. Electronically Signed   By: Genevie Ann M.D.   On: 12/11/2017 11:08   Mr Thoracic Spine W Wo Contrast  Result Date: 12/11/2017 CLINICAL DATA:  Ataxia EXAM: MRI THORACIC WITHOUT AND WITH CONTRAST TECHNIQUE: Multiplanar and multiecho pulse sequences of the thoracic spine were obtained without and with intravenous contrast. CONTRAST:  72mL MULTIHANCE GADOBENATE DIMEGLUMINE 529 MG/ML IV SOLN COMPARISON:  None. FINDINGS: MRI THORACIC SPINE FINDINGS Alignment:  Normal Vertebrae: Mild chronic fracture superior endplate of T06. No acute fracture or mass. Cord: Thoracic cord syrinx. The largest syrinx measures 2.5 mm at T6. Smaller syrinx extends down to T8. Small syrinx again at T12. No cord mass or enhancing lesion identified. Paraspinal and other soft tissues: Negative Disc levels: No significant spinal stenosis. Mild facet degeneration in the mid and lower thoracic spine. No focal disc protrusion. 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 No underlying cord compression spinal stenosis or mass lesion identified Mild thoracic disc and facet degeneration. Electronically Signed   By: Franchot Gallo M.D.   On: 12/11/2017 14:59   Dg Fluoro Guide Lumbar Puncture  Result Date: 12/11/2017 CLINICAL DATA:  Ataxia, cord lesions on thoracic and lumbar MRI examinations. EXAM: DIAGNOSTIC LUMBAR PUNCTURE UNDER FLUOROSCOPIC GUIDANCE FLUOROSCOPY TIME:  Fluoroscopy Time:  0 minutes, 30 seconds Radiation Exposure Index (if provided by the fluoroscopic device):  3.2 mGy Number of Acquired Spot Images: 0 PROCEDURE: I discussed the risks (including hemorrhage, infection, headache, and nerve damage, among others), benefits, and alternatives to fluoroscopically guided lumbar puncture with the patient. We specifically  discussed the high technical likelihood of success of the procedure. The patient understood and elected to undergo the procedure. Standard time-out was employed. Following sterile skin prep and local anesthetic administration consisting of 1 percent lidocaine, a 22 gauge spinal needle was advanced without difficulty into the thecal sac at the at the L3-4 level. I elected to use a slight paramedian approach due to the very small amount of interspinous space appreciable on the lumbar MRI. Clear CSF was returned. Opening pressure was not directly measured due to projected difficulty in turning the patient, but did not seem elevated given the sluggish flow of CSF. 11 cc of clear CSF was collected. The needle was subsequently removed and the skin cleansed and bandaged. No immediate complications were observed. IMPRESSION: 1. Technically successful lumbar puncture at the L3-4 level, yielding 11 cc of clear CSF sent to the lab for requested analyses. Electronically Signed   By: Van Clines M.D.   On: 12/11/2017 16:35    Assessment and Plan:   1.  Weakness:  the patient did not have a mechanical or syncopal episode causing fall.  The patient did feel "as if he was fading back", but was conscious the entire time and was able to lean against the dresser and slide to the floor.  Unable to get up on his own.  Was found to be anemic and also have cellulitis, concern that this may be contributing.  He is currently receiving antibiotic therapy.  He denies any bleeding.  2.  Ventricular tachycardia, with frequent ventricular ectopy: Heart rate is labile, with periods of atrial flutter, atrial fib, pauses, 2 episodes of ventricular tachycardia.  Concern for sick sinus  syndrome. He is currently not on any AV nodal blocking agents.  Will discuss need to be evaluated for pacemaker implantation in the setting.  Uncertain if this is contributing to this or causing admission.Check echo for changes in LV fx.   3.  Atrial fib flutter: Remains on Xarelto.  May need to consider discontinuing in the setting of generalized weakness and inability to ambulate or assist himself to standing position, ataxia.  Risks are outweighing benefits at this time.  In light of anemia will discontinue for now. Discontinue heparin.   4.  History of coronary artery disease: History of stenting to unknown artery: Troponin found to be negative x3, no acute ST-T wave abnormalities were noted per EKG.   For questions or updates, please contact [place] Please consult [email or web address] for contact info under Cardiology/STEMI.   Signed, Phill Myron. West Pugh, ANP, AACC  12/14/2017 10:40 AM   The patient was seen and examined, and I agree with the history, physical exam, assessment and plan as documented above, with modifications as noted below. I have also personally reviewed all relevant documentation, old records, labs, and both radiographic and cardiovascular studies. I have also independently interpreted old and new ECG's.  76 year old male known to me from the clinic.  He has a history of coronary artery disease with remote angioplasty and stenting in 2002 and [place], [place].  He also has a history of atrial fibrillation and has been anticoagulated with Xarelto.  He has a history of bradycardia and falls and beta-blocker was stopped in August 2018.  Echocardiogram in May 2017 demonstrated normal left ventricular systolic function and regional wall motion, LVEF 65-70%.  He was admitted with falls and has been undergoing a neurologic workup.  He denies chest pain, palpitations, and shortness of breath.  He did have some  leg edema upon admission and was started on IV Lasix.   For his atrial fibrillation, he was started on IV heparin while hospitalized.  He is also being treated for cellulitis with Keflex.  He was noted to have a 12 beat run of ventricular tachycardia yesterday and an additional 21 beat run of ventricular tachycardia shortly after midnight earlier this morning.  He was asymptomatic.  He denies chest pain, shortness of breath, and palpitations at home.  Troponins were checked on December 17 and December 18 and were found to be normal.  Chest x-ray on 12/10/17 showed no acute disease. Additional neurologic imaging with MRI and CT as detailed above which I reviewed. There was some concern for transverse myelitis.  He has been evaluated by neurology here.  Telemetry demonstrates atrial fibrillation with PVCs, nonsustained ventricular tachycardia as noted above, with no significant pauses.  There are episodes of bradycardia with a heart rate in the high 30 bpm range and low 40 bpm minute range.  Recommendations: I am uncertain if episodic bradycardia is contributing to his generalized weakness and falls.  He denies dizziness.  He does not have any long pauses.  I suspect this is less likely. With regards to ventricular tachycardia, as he is on IV Lasix, I would aim to keep potassium greater than 4.  Magnesium was replaced by the overnight covering hospitalist and is above normal.  I will check an echocardiogram to evaluate for changes in left ventricular systolic function and/or regional wall motion. I may consider an outpatient stress test to evaluate for any ischemic etiology as percutaneous coronary intervention was done 16 years ago.  It is reassuring that he has not been experiencing chest pain and/or exertional dyspnea. I agree that he is no longer a candidate for anticoagulation given his recurrent falls and high risk for bleeding complications.  Derrick Sable, MD, [facility]  12/14/2017 1:06 PM

## 2017-12-14 NOTE — Discharge Summary (Addendum)
Physician Discharge Summary  [name] TGG:[id number] DOB: 1941/07/04 DOA: 12/10/2017  PCP: Raylene Everts, MD  Admit date: 12/10/2017 Discharge date: 12/15/2017  Admitted From: Home Disposition:  SNF ([facility])  Recommendations for Outpatient Follow-up:  1. Follow-up with M.D. at SNF in 1 week 2. Patient will complete a seven-day course of antibiotics on 12/24. 3. Please check BMET ( k and mg in 3 days). Also follow results for B12, TSH and vit D level sent on 12/21.  Home Health:None Equipment/Devices:none  Discharge Condition:Fair CODE STATUS:DO NOT RESUSCITATE Diet recommendation: Heart Healthy    Discharge Diagnoses:  Principal Problem:   Unsteady gait   Active Problems:   A-fib (HCC)   Essential hypertension   Hypothyroid   HLD (hyperlipidemia)   Ataxia   Cellulitis of right lower extremity   Syrinx of spinal cord (HCC)   Sinus bradycardia   NSVT (nonsustained ventricular tachycardia) (HCC)  Brief narrative/history of present illness Please refer to admission H&P for details, in brief, 76 year old male with history of A. Fib on anticoagulation, hypertension, hyperlipidemia presented to the ED after a fall at home. Patient also had a fall about 2-3 weeks ago without loss of consciousness,since within a started noticing swelling of his legs. Patient denied any Bowel or urinary incontinence. Patient was seen by his PCP 2 weeks back and found to be very weak and unsteady. He was planning to be referred to neurology and PT. When seen in the ED his vitals were stable. CT of the head and MRI of the brain was negative for acute findings. He was also found to have right lower leg cellulitis, Started on IV vancomycin and admitted to hospitalist service. Patient had an MRI of the Cervical spine ordered on admission which was concerning for transverse myelitis. [facility] who recommended MRI of the thoracic spine as well as lumbar puncture  with MRI showing a syrinx on the thoracic spine. 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 CSF fluid and ounces was normal. Neurologist at [facility] evaluated and recommended that the ataxia is likely multifactorial. Patient seen by PT and recommends SNF.  Hospital course  Ataxia with generalized weakness MRI of the lumbar spine showing patchy abnormal signal in the Lower spinal cord which is nonspecific. No signs of tumor. 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 Patient received 2 days of Prednisone.CPK normal. -I have ordered labs for vitamin B12 level, TSH and vitamin D levels which should be followed as outpatient. Also Cannot rule out possibility of statin-induced myopathy completely so I will discontinue his statin for now and see for improvement.  Seen by PT and recommended SNF. Neurology consulted appreciated .   Atrial fibrillation Stable on telemetry. Given his Multiple recent falls with increased fall risk, anticoagulation was discontinued. Also not on beta blocker due to sinus bradycardia at night.  Lower extremity edema Switched to scheduled Lasix.  Cellulitis of right leg Treating with seven-day course of Keflex.Appears improved on my exam today.  PVCs with NSVT Low magnesium replenished. 2-D echo done which showed normal LVEF and no wall motion abnormality. Ordered potassium and magnesium supplement (plan to keep K >4 and Mg >2). Needs monitoring as outpatient.   Essential hypertension Stable. Monitor on Lasix.  Sinus bradycardia Patient's heart rate drops to 40s (occasionally high 30s) mainly while  asleep. 2-D echo without wall motion abnormality. Will follow as outpatient and decide on possible stress test. Patient would likely benefit from sleep study as  outpatient. Avoid AV nodal blocking agents.  Iron deficiency anemia Started on iron supplement   Consults: Cardiology Neurology  Family communication: None at bedside  Procedure:  MRI Brain, thoracic and lumbar spine Lumbar puncture CT head 2-D echo  Discharge Instructions   Allergies as of 12/14/2017   No Known Allergies     Medication List    STOP taking these medications   lisinopril 2.5 MG tablet Commonly known as:  PRINIVIL,ZESTRIL   neomycin-polymyxin-hydrocortisone OTIC solution Commonly known as:  CORTISPORIN   rivaroxaban 20 MG Tabs tablet Commonly known as:  XARELTO   simvastatin 40 MG tablet Commonly known as:  ZOCOR     TAKE these medications   acetaminophen 500 MG tablet Commonly known as:  TYLENOL Take 1,000 mg by mouth every 6 (six) hours as needed.   cephALEXin 500 MG capsule Commonly known as:  KEFLEX Take 1 capsule (500 mg total) by mouth every 6 (six) hours for 2 days. Start taking on:  12/15/2017   ferrous sulfate 325 (65 FE) MG tablet Take 1 tablet (325 mg total) by mouth 2 (two) times daily with a meal.   folic acid 1 MG tablet Commonly known as:  FOLVITE Take 1 tablet (1 mg total) by mouth daily.   furosemide 40 MG tablet Commonly known as:  LASIX Take 1 tablet (40 mg total) by mouth daily. What changed:    when to take this  reasons to take this   inFLIXimab 100 MG injection Commonly known as:  REMICADE Inject into the vein.   levothyroxine 75 MCG tablet Commonly known as:  SYNTHROID, LEVOTHROID Take 1 tablet (75 mcg total) by mouth daily before breakfast.   Magnesium Oxide 200 MG Tabs Take 1 tablet (200 mg total) by mouth daily.   methotrexate 2.5 MG tablet Take 15 mg by mouth every Sunday. 6 tablets on Sunday   polycarbophil 625 MG tablet Commonly known as:  FIBERCON Take 625 mg by mouth at bedtime.   potassium chloride SA 20 MEQ tablet Commonly known as:  K-DUR,KLOR-CON Take 2 tablets (40 mEq total) by  mouth daily. T What changed:    how much to take  how to take this  when to take this  additional instructions   timolol 0.5 % ophthalmic solution Commonly known as:  TIMOPTIC INSTILL ONE DROP INTO EACH EYE TWICE DAILY       Contact information for follow-up providers    M.D. at SNF in 1 week Follow up.            Contact information for after-discharge care    [facility] .   Service:  Skilled Nursing Contact information: [address] [place] [phone]                 No Known Allergies    Procedures/Studies: Dg Chest 2 View  Result Date: 12/10/2017 CLINICAL DATA:  Weakness for 2-4 weeks, worsening. EXAM: CHEST  2 VIEW COMPARISON:  None. FINDINGS: There is cardiomegaly without edema. Lungs are clear. No pneumothorax or pleural effusion. Aortic atherosclerosis is noted. No acute bony abnormality. Remote fracture of the distal right clavicle is noted. IMPRESSION: Cardiomegaly without acute disease. Atherosclerosis. Electronically Signed   By: Inge Rise M.D.   On: 12/10/2017 10:59   Ct Head  Wo Contrast  Result Date: 12/10/2017 CLINICAL DATA:  The patient suffered an episode of dizziness with a fall this morning. EXAM: CT HEAD WITHOUT CONTRAST TECHNIQUE: Contiguous axial images were obtained from the base of the skull through the vertex without intravenous contrast. COMPARISON:  Head CT scan 05/18/2017. FINDINGS: Brain: There is mild atrophy and some chronic microvascular ischemic change. No evidence of acute abnormality including hemorrhage, infarct, mass lesion, mass effect, midline shift or abnormal extra-axial fluid collection. No hydrocephalus or pneumocephalus. Vascular: Atherosclerosis noted. Skull: Intact. Sinuses/Orbits: There is partial visualization of marked mucosal thickening in the right maxillary sinus with associated wall thickening consistent with chronic change. This finding  was present on the prior exam. Other: None. IMPRESSION: No acute abnormality. Mild atrophy and chronic microvascular change. Chronic right maxillary sinus disease. Electronically Signed   By: Inge Rise M.D.   On: 12/10/2017 11:16   Mr Brain Wo Contrast (neuro Protocol)  Result Date: 12/10/2017 CLINICAL DATA:  Ataxia with stroke suspected. EXAM: MRI HEAD WITHOUT CONTRAST TECHNIQUE: Multiplanar, multiecho pulse sequences of the brain and surrounding structures were obtained without intravenous contrast. COMPARISON:  Head CT from earlier today FINDINGS: Brain: No acute infarction, hemorrhage, hydrocephalus, extra-axial collection or mass lesion. Generalized cortical atrophy and mild chronic microvascular ischemic change in the cerebral white matter. Vascular: Major flow voids are preserved. Skull and upper cervical spine: Negative for marrow lesion Sinuses/Orbits: Chronic right maxillary sinusitis with inspissated secretions and atelectasis. Chronic mastoid opacification that is improved from CT 09/05/2017 IMPRESSION: 1. Senescent changes without acute finding including infarct. 2. Chronic right maxillary sinusitis. 3. Mastoid opacification that is improved from CT 09/05/2017. Electronically Signed   By: Monte Fantasia M.D.   On: 12/10/2017 12:20   Mr Lumbar Spine Wo Contrast  Result Date: 12/11/2017 CLINICAL DATA:  76 year old male with progressive weakness for 2-4 weeks. Fall in April. EXAM: MRI LUMBAR SPINE WITHOUT CONTRAST TECHNIQUE: Multiplanar, multisequence MR imaging of the lumbar spine was performed. No intravenous contrast was administered. COMPARISON:  Lumbar radiographs 04/01/2017. FINDINGS: Segmentation: Transitional anatomy suspected with lumbarized S1 level based on the appearance of the April lumbar radiographs. Full size ribs designated at T12 by this numbering system. Correlation with radiographs is recommended prior to any operative intervention. Alignment: Mild grade 1  anterolisthesis at both L5-S1 and S1-S2 with exaggerated lower lumbar lordosis. Mild retrolisthesis of L2 on L3. Vertebrae: Mild T12 superior endplate deformity with minimal associated superior endplate marrow edema (series 5, image 13). Normal marrow signal elsewhere in the T12 vertebral body. Chronic degenerative appearing endplate marrow signal changes at L2-L3, and also anteriorly at L1-L2. Background bone marrow signal elsewhere within normal limits. No other No marrow edema or evidence of acute osseous abnormality. Intact visible sacrum and SI joints. Conus medullaris and cauda equina: Conus extends to the L1-L2 level. There is subtle patchy T2 and STIR hyperintensity within the lower thoracic spinal cord seen at the T11-T12 level on both sagittal series 3, image 10 and axial series 6, image 1. Superimposed more central linear and intense signal abnormality in in the cord posterior to the T12 vertebral body could be superimposed prominence of the central spinal canal. Persistent central cord T2 hyperintensity continues to the conus as seen on series 6, image 4. No cord expansion. The cauda equina nerve roots appear normal. Paraspinal and other soft tissues: Generalized bilateral patchy abnormal increased STIR signal in the bilateral posterior erector spinae muscles as seen on series 5, image 18 (on the left) and image  1 (on the right) similar indistinct bilateral medial psoas muscle signal abnormality as seen on series 5, image 17. The muscles do not appear expanded. No intramuscular fluid collection. Negative visualized abdominal viscera. Diverticulosis of the colon in the lower abdomen and pelvis. Disc levels: Age congruent lumbar spine degeneration at most levels. There is mild retrolisthesis at L2-L3 with advanced disc and endplate degeneration eccentric to the left, but no spinal stenosis. There is mild left lateral recess and left foraminal stenosis at that level. There is moderate and severe L4-L5,  L5-S1 and S1-S2 facet degeneration. Bilateral degenerative L5-S1 facet joint fluid. These changes contribute to mild bilateral L4 neural foraminal stenosis, and mild L5-S1 lateral recess and foraminal stenosis. No significant spinal stenosis. IMPRESSION: 1. Patchy abnormal signal in the visible lower spinal cord and conus is nonspecific. Are there symptoms of spinal cord infarct? There is no cord expansion to suggest tumor. The differential diagnosis would include transverse myelitis. Thoracic spine MRI without and with contrast may be valuable to further evaluate spinal cord. 2. Diffuse abnormal paraspinal muscle (bilateral psoas and erector spinae muscle) compatible with nonspecific myositis such as due to mild muscle edema or denervation changes. No intramuscular fluid collection. Perhaps this is related to #1. 3. No acute osseous abnormality. Exaggerated lumbar lordosis with multilevel mild spondylolisthesis and transitional lumbosacral anatomy. Chronic disc and endplate degeneration in the upper lumbar spine, and chronic posterior element degeneration in the lower lumbar spine but no spinal stenosis. 4. Diverticulosis of the large bowel in the lower abdomen and pelvis. Electronically Signed   By: Genevie Ann M.D.   On: 12/11/2017 11:08   Mr Thoracic Spine W Wo Contrast  Result Date: 12/11/2017 CLINICAL DATA:  Ataxia EXAM: MRI THORACIC WITHOUT AND WITH CONTRAST TECHNIQUE: Multiplanar and multiecho pulse sequences of the thoracic spine were obtained without and with intravenous contrast. CONTRAST:  61mL MULTIHANCE GADOBENATE DIMEGLUMINE 529 MG/ML IV SOLN COMPARISON:  None. FINDINGS: MRI THORACIC SPINE FINDINGS Alignment:  Normal Vertebrae: Mild chronic fracture superior endplate of H70. No acute fracture or mass. Cord: Thoracic cord syrinx. The largest syrinx measures 2.5 mm at T6. Smaller syrinx extends down to T8. Small syrinx again at T12. No cord mass or enhancing lesion identified. Paraspinal and other soft  tissues: Negative Disc levels: No significant spinal stenosis. Mild facet degeneration in the mid and lower thoracic spine. No focal disc protrusion. 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 No underlying cord compression spinal stenosis or mass lesion identified Mild thoracic disc and facet degeneration. Electronically Signed   By: Franchot Gallo M.D.   On: 12/11/2017 14:59   Dg Hip Unilat W Or Wo Pelvis 2-3 Views Right  Result Date: 11/19/2017 CLINICAL DATA:  [place] on leg EXAM: DG HIP (WITH OR WITHOUT PELVIS) 2-3V RIGHT COMPARISON:  None. FINDINGS: No fracture or dislocation. Pubic symphysis and rami are intact. Vascular calcifications. Mild degenerative changes. IMPRESSION: No acute osseous abnormality Electronically Signed   By: Donavan Foil M.D.   On: 11/19/2017 01:01   Dg Femur Min 2 Views Left  Result Date: 11/19/2017 CLINICAL DATA:  Fall EXAM: LEFT FEMUR 2 VIEWS COMPARISON:  None. FINDINGS: No fracture or dislocation. Status post left knee arthroplasty with normal alignment. Vascular calcifications. IMPRESSION: Status post left knee replacement. No definite acute osseous abnormality. Electronically Signed   By: Donavan Foil M.D.   On: 11/19/2017 01:03   Dg Fluoro Guide Lumbar Puncture  Result Date: 12/11/2017 CLINICAL DATA:  Ataxia, cord lesions on thoracic and lumbar MRI examinations. EXAM: DIAGNOSTIC LUMBAR PUNCTURE UNDER FLUOROSCOPIC GUIDANCE FLUOROSCOPY TIME:  Fluoroscopy Time:  0 minutes, 30 seconds Radiation Exposure Index (if provided by the fluoroscopic device): 3.2 mGy Number of Acquired Spot Images: 0 PROCEDURE: I discussed the risks (including hemorrhage, infection, headache, and nerve damage, among others), benefits, and alternatives to fluoroscopically guided lumbar puncture with the patient. We specifically discussed the high technical likelihood of success of the procedure. The patient understood and  elected to undergo the procedure. Standard time-out was employed. Following sterile skin prep and local anesthetic administration consisting of 1 percent lidocaine, a 22 gauge spinal needle was advanced without difficulty into the thecal sac at the at the L3-4 level. I elected to use a slight paramedian approach due to the very small amount of interspinous space appreciable on the lumbar MRI. Clear CSF was returned. Opening pressure was not directly measured due to projected difficulty in turning the patient, but did not seem elevated given the sluggish flow of CSF. 11 cc of clear CSF was collected. The needle was subsequently removed and the skin cleansed and bandaged. No immediate complications were observed. IMPRESSION: 1. Technically successful lumbar puncture at the L3-4 level, yielding 11 cc of clear CSF sent to the lab for requested analyses. Electronically Signed   By: Van Clines M.D.   On: 12/11/2017 16:35    2-D echo Study Conclusions  - Procedure narrative: Transthoracic echocardiography. Image   quality was fair. The study was technically difficult, as a   result of poor sound wave transmission. Intravenous contrast   (Definity) was administered. - Left ventricle: The cavity size was normal. Wall thickness was at   the upper limits of normal. Systolic function was normal. The   estimated ejection fraction was in the range of 55% to 60%. Wall   motion was normal; there were no regional wall motion   abnormalities. The study was not technically sufficient to allow   evaluation of LV diastolic dysfunction due to atrial   fibrillation. - Aortic valve: Moderately calcified annulus. Trileaflet; mildly   calcified leaflets. There was no stenosis. - Mitral valve: There was mild to moderate regurgitation. - Left atrium: The atrium was severely dilated. - Right atrium: The atrium was severely dilated. - Atrial septum: No defect or patent foramen ovale was identified. - Systemic  veins: Dilated IVC with normal respiratory variation.   Estimated CVP 8 mmHg.   Subjective: Reports feeling better today. Denies shortness of breath.Noted to have few runs of V. Tach on the monitor  Discharge Exam: Vitals:   12/13/17 2132 12/14/17 1300  BP: 110/64 (!) 115/50  Pulse: 60 98  Resp: 18 18  Temp: 98.4 F (36.9 C) 98.9 F (37.2 C)  SpO2: 97% 94%   Vitals:   12/13/17 1138 12/13/17 1503 12/13/17 2132 12/14/17 1300  BP:  109/62 110/64 (!) 115/50  Pulse:  66 60 98  Resp:  18 18 18   Temp:  98.2 F (36.8 C) 98.4 F (36.9 C) 98.9 F (37.2 C)  TempSrc:   Oral Oral  SpO2: 99% 98% 97% 94%  Weight:      Height:        General: Elderly male not in distress HEENT: Pallor present, moist mucosa, supple neck Chest: Clear bilaterally CVS: S1 and S2 irregular, no murmurs rub or gallop GI: Soft, nondistended, nontender, Bowel sounds present Musculoskeletal: Warm, trace pedal edema bilaterally. Right lower leg cellulitis  appears to have improved CNS: Alert and oriented      The results of significant diagnostics from this hospitalization (including imaging, microbiology, ancillary and laboratory) are listed below for reference.     Microbiology: Recent Results (from the past 240 hour(s))  Culture, blood (routine x 2)     Status: None (Preliminary result)   Collection Time: 12/10/17  9:49 AM  Result Value Ref Range Status   Specimen Description BLOOD RIGHT ARM  Final   Special Requests   Final    BOTTLES DRAWN AEROBIC AND ANAEROBIC Blood Culture adequate volume   Culture NO GROWTH 4 DAYS  Final   Report Status PENDING  Incomplete  CSF culture     Status: None (Preliminary result)   Collection Time: 12/11/17  3:40 PM  Result Value Ref Range Status   Specimen Description CSF  Final   Special Requests NONE  Final   Gram Stain   Final    NO ORGANISMS SEEN Performed at [facility]    Culture   Final    NO GROWTH 3 DAYS Performed at [facility], [place] [address], [place],  [address]    Report Status PENDING  Incomplete     Labs: BNP (last 3 results) Recent Labs    12/10/17 0957  BNP 629.5*   Basic Metabolic Panel: Recent Labs  Lab 12/10/17 0948 12/13/17 0424 12/14/17 0420  NA 133* 136 136  K 4.3 3.9 3.6  CL 100* 100* 102  CO2 28 26 24   GLUCOSE 115* 160* 123*  BUN 12 8 13   CREATININE 0.77 0.76 0.67  CALCIUM 8.2* 8.6* 8.5*  MG  --  1.8 2.7*   Liver Function Tests: Recent Labs  Lab 12/11/17 1317  ALBUMIN 2.2*   No results for input(s): LIPASE, AMYLASE in the last 168 hours. No results for input(s): AMMONIA in the last 168 hours. CBC: Recent Labs  Lab 12/10/17 0948 12/12/17 0425 12/13/17 0424 12/14/17 0420  WBC 10.5 8.6 6.9 14.8*  NEUTROABS 8.1*  --   --   --   HGB 9.9* 9.9* 10.8* 9.5*  HCT 31.2* 31.4* 34.9* 30.7*  MCV 101.0* 100.6* 101.2* 100.7*  PLT 572* 578* 610* 551*   Cardiac Enzymes: Recent Labs  Lab 12/10/17 1754 12/10/17 2212 12/11/17 0422 12/11/17 2013 12/12/17 1309  CKTOTAL  --   --   --  75 79  CKMB  --   --   --  2.8  --   TROPONINI <0.03 <0.03 <0.03  --   --    BNP: Invalid input(s): POCBNP CBG: No results for input(s): GLUCAP in the last 168 hours. D-Dimer No results for input(s): DDIMER in the last 72 hours. Hgb A1c No results for input(s): HGBA1C in the last 72 hours. Lipid Profile No results for input(s): CHOL, HDL, LDLCALC, TRIG, CHOLHDL, LDLDIRECT in the last 72 hours. Thyroid function studies No results for input(s): TSH, T4TOTAL, T3FREE, THYROIDAB in the last 72 hours.  Invalid input(s): FREET3 Anemia work up No results for input(s): VITAMINB12, FOLATE, FERRITIN, TIBC, IRON, RETICCTPCT in the last 72 hours. Urinalysis    Component Value Date/Time   COLORURINE AMBER (A) 12/10/2017 1638   APPEARANCEUR HAZY (A) 12/10/2017 1638   LABSPEC 1.019 12/10/2017 1638   PHURINE 5.0 12/10/2017 [place] 12/10/2017 1638   HGBUR NEGATIVE 12/10/2017  [place] 12/10/2017 [place] 12/10/2017 [place] 12/10/2017 1638  NITRITE NEGATIVE 12/10/2017 [place] 12/10/2017 1638   Sepsis Labs Invalid input(s): PROCALCITONIN,  WBC,  LACTICIDVEN Microbiology Recent Results (from the past 240 hour(s))  Culture, blood (routine x 2)     Status: None (Preliminary result)   Collection Time: 12/10/17  9:49 AM  Result Value Ref Range Status   Specimen Description BLOOD RIGHT ARM  Final   Special Requests   Final    BOTTLES DRAWN AEROBIC AND ANAEROBIC Blood Culture adequate volume   Culture NO GROWTH 4 DAYS  Final   Report Status PENDING  Incomplete  CSF culture     Status: None (Preliminary result)   Collection Time: 12/11/17  3:40 PM  Result Value Ref Range Status   Specimen Description CSF  Final   Special Requests NONE  Final   Gram Stain   Final    NO ORGANISMS SEEN Performed at [facility]    Culture   Final    NO GROWTH 3 DAYS Performed at [facility], [place] [address], [place], [place] [address]    Report Status PENDING  Incomplete     Time coordinating discharge: Over 30 minutes  SIGNED:   Louellen Molder, MD  [facility] 12/14/2017, 4:07 PM Pager   If 7PM-7AM, please contact night-coverage [email or web address] Password [email or web address]

## 2017-12-14 NOTE — Care Management Important Message (Signed)
Important Message  Patient Details  Name: [name] MRN: [id number] Date of Birth: [birth year]   Medicare Important Message Given:  Yes    Sherald Barge, RN 12/14/2017, 11:02 AM

## 2017-12-14 NOTE — Progress Notes (Signed)
Pt had a 21 beat run of vtach. Message sent to MD.

## 2017-12-14 NOTE — Progress Notes (Signed)
*  PRELIMINARY RESULTS* Echocardiogram 2D Echocardiogram has been performed with Definity.  Samuel Germany 12/14/2017, 5:19 PM

## 2017-12-14 NOTE — Progress Notes (Signed)
ANTICOAGULATION CONSULT NOTE  Pharmacy Consult for heparin Indication: atrial fibrillation  No Known Allergies  Patient Measurements: Height: 5\' 10"  (177.8 cm) Weight: 179 lb (81.2 kg) IBW/kg (Calculated) : 73 Heparin Dosing Weight: 81 kg  Vital Signs: Temp: 98.4 F (36.9 C) (12/20 2132) Temp Source: Oral (12/20 2132) BP: 110/64 (12/20 2132) Pulse Rate: 60 (12/20 2132)  Labs: Recent Labs    12/11/17 1350 12/11/17 2013  12/12/17 0425 12/12/17 1309 12/12/17 2221 12/13/17 0424 12/14/17 0420  HGB  --   --    < > 9.9*  --   --  10.8* 9.5*  HCT  --   --   --  31.4*  --   --  34.9* 30.7*  PLT  --   --   --  578*  --   --  610* 551*  APTT 38* 38*  --   --   --   --   --   --   HEPARINUNFRC 0.12* 0.12*  --   --   --  0.17* 0.31 0.35  CREATININE  --   --   --   --   --   --  0.76 0.67  CKTOTAL  --  75  --   --  79  --   --   --   CKMB  --  2.8  --   --   --   --   --   --    < > = values in this interval not displayed.   Estimated Creatinine Clearance: 81.1 mL/min (by C-G formula based on SCr of 0.67 mg/dL).  Assessment: 76 yo man who was on xarelto pta now to start heparin drip.  Last dose xarelto 12/16 at 10:00. Heparin level therapeutic this AM.  Goal of Therapy:  Heparin level 0.3-0.7 units/ml Monitor platelets by anticoagulation protocol: Yes   Plan:  Continue Heparin drip at 1400 units/hr Check anti Xa level daily Monitor for bleeding complications  Excell Seltzer Poteet 12/14/2017,9:03 AM

## 2017-12-14 NOTE — Progress Notes (Signed)
Physical Therapy Treatment Patient Details Name: [name] MRN: [id number] DOB: 03/13/1941 Today's Date: 12/14/2017    History of Present Illness [name] is a 76 y.o. male with medical history significant of atrial fibrillation, falls, HTN, RA, HLD, presented to the ED via EMS after a fall at home. Patient reports he had a fall 2-3 weeks ago in which he fell face down on the sidewalk.  His legs started swelling at that time and Dr. Meda Burgess gave him medication to take care of this.  The patient had a 21 beat run of V. tach while sleeping.  The chart was briefly reviewed.  History of atrial fibrillation with a heart rate fluctuating between 60-66 bpm.  He is on IV furosemide 40 mg IV daily. Magnesium level was 1.8 mg/dL and potassium 3.9 mmol/L this morning.  I will supplement magnesium and recheck potassium level with morning labs. 76 yo man who was on xarelto pta now to start heparin drip.  Last dose xarelto 12/16 at 10:00. Heparin level therapeutic this AM.     PT Comments    Patient tolerated treatment well. Did exhibit loss of balance x2 when ambulating with [facility] and conversing with therapist. Patient ambulated 250 feet with SPC and supervision. PT reviewed staggered feet and "nose over toes" strategy to perform sit to/from stand strategy to accommodate for left knee decreased flexion. Patient continues to benefit from physical therapy in current setting and will continue to benefit in next venue for continued increase in strength, balance, endurance and functional, safe mobility and transfers.    Follow Up Recommendations  Home health PT;Supervision - Intermittent     Equipment Recommendations  Rolling walker with 5" wheels    Recommendations for Other Services       Precautions / Restrictions Precautions Precautions: Fall Precaution Comments: 3 falls in past month Restrictions Weight Bearing Restrictions: No    Mobility  Bed Mobility Overal bed mobility:  Independent                Transfers Overall transfer level: Needs assistance Equipment used: Straight cane(personal in room) Transfers: Sit to/from Stand;Stand Pivot Transfers Sit to Stand: Supervision(required 3 attempts and verbal cues for staggering feet to accommodate left knee decreased flexion)            Ambulation/Gait Ambulation/Gait assistance: Supervision Ambulation Distance (Feet): 250 Feet Assistive device: Straight cane Gait Pattern/deviations: Step-through pattern;Decreased stride length;Decreased step length - right;Decreased step length - left   Gait velocity interpretation: Below normal speed for age/gender General Gait Details: demonstrates good return for using SPC without loss of balance (LOB) when facing forward; did have 2 LOBs when attempting to walker and have conversation with therapist, 2 point gait pattern using [facility]   Stairs            Wheelchair Mobility    Modified Rankin (Stroke Patients Only)       Balance Overall balance assessment: Needs assistance Sitting-balance support: No upper extremity supported;Feet supported Sitting balance-Leahy Scale: Good     Standing balance support: Single extremity supported;During functional activity Standing balance-Leahy Scale: Fair Standing balance comment: fair/good with SPC                            Cognition Arousal/Alertness: Awake/alert Behavior During Therapy: WFL for tasks assessed/performed Overall Cognitive Status: Within Functional Limits for tasks assessed  Exercises General Exercises - Lower Extremity Ankle Circles/Pumps: Seated;AROM;Strengthening;Both;10 reps Long Arc Quad: Seated;AROM;Strengthening;Both;10 reps Hip Flexion/Marching: Seated;AROM;Strengthening;Both;10 reps Shoulder Exercises Shoulder Flexion: Seated;AROM;Strengthening;Both;10 reps    General Comments        Pertinent Vitals/Pain  Pain Assessment: No/denies pain    Home Living                      Prior Function            PT Goals (current goals can now be found in the care plan section) Acute Rehab PT Goals Patient Stated Goal: return home; he stated his daughter told him he may have to stay somewhere else before he is able to go home. PT Goal Formulation: With patient Time For Goal Achievement: 12/15/17 Potential to Achieve Goals: Good Progress towards PT goals: Progressing toward goals    Frequency    Min 3X/week      PT Plan Current plan remains appropriate    Co-evaluation              AM-PAC PT "6 Clicks" Daily Activity  Outcome Measure  Difficulty turning over in bed (including adjusting bedclothes, sheets and blankets)?: None Difficulty moving from lying on back to sitting on the side of the bed? : None Difficulty sitting down on and standing up from a chair with arms (e.g., wheelchair, bedside commode, etc,.)?: A Little Help needed moving to and from a bed to chair (including a wheelchair)?: A Little Help needed walking in hospital room?: A Little Help needed climbing 3-5 steps with a railing? : A Little 6 Click Score: 20    End of Session   Activity Tolerance: Patient tolerated treatment well Patient left: in chair;with call bell/phone within reach Nurse Communication: Mobility status PT Visit Diagnosis: Unsteadiness on feet (R26.81);Other abnormalities of gait and mobility (R26.89);Muscle weakness (generalized) (M62.81)     Time: 1610-9604 PT Time Calculation (min) (ACUTE ONLY): 30 min  Charges:  $Gait Training: 8-22 mins $Therapeutic Activity: 8-22 mins                    G Codes:  Functional Assessment Tool Used: AM-PAC 6 Clicks Basic Mobility Functional Limitation: Mobility: Walking and moving around Mobility: Walking and Moving Around Current Status (V4098): At least 20 percent but less than 40 percent impaired, limited or restricted Mobility: Walking and  Moving Around Goal Status [phone]): At least 20 percent but less than 40 percent impaired, limited or restricted Mobility: Walking and Moving Around Discharge Status [phone]): At least 20 percent but less than 40 percent impaired, limited or restricted    [name]. Hartnett-Rands, MS, PT Per [place] [phone]

## 2017-12-14 NOTE — Plan of Care (Signed)
Pt alert and oriented x4. Able to make needs known. No complaint of pain or distress at this time. Fall mat in place. Telemetry box 33 Afib. Call light within reach. Bed in lowest position will continue to monitor.

## 2017-12-14 NOTE — Progress Notes (Signed)
Night shift floor coverage note.  The patient had a 21 beat run of V. tach while sleeping.  The chart was briefly reviewed.  History of atrial fibrillation with a heart rate fluctuating between 60-66 bpm.  He is on IV furosemide 40 mg IV daily. Magnesium level was 1.8 mg/dL and potassium 3.9 mmol/L this morning.  I will supplement magnesium and recheck potassium level with morning labs.  Tennis Must, MD

## 2017-12-15 ENCOUNTER — Inpatient Hospital Stay
Admission: RE | Admit: 2017-12-15 | Discharge: 2017-12-30 | Disposition: A | Discharge status: Home / Self Care | Payer: Medicare PPO | Source: Ambulatory Visit | Attending: Internal Medicine | Admitting: Internal Medicine

## 2017-12-15 DIAGNOSIS — G950 Syringomyelia and syringobulbia: Principal | ICD-10-CM

## 2017-12-15 DIAGNOSIS — R001 Bradycardia, unspecified: Secondary | ICD-10-CM

## 2017-12-15 DIAGNOSIS — I4891 Unspecified atrial fibrillation: Secondary | ICD-10-CM

## 2017-12-15 LAB — CBC
HCT: 35.3 % — ABNORMAL LOW (ref 39.0–52.0)
HEMOGLOBIN: 11 g/dL — AB (ref 13.0–17.0)
MCH: 31.6 pg (ref 26.0–34.0)
MCHC: 31.2 g/dL (ref 30.0–36.0)
MCV: 101.4 fL — ABNORMAL HIGH (ref 78.0–100.0)
Platelets: 582 10*3/uL — ABNORMAL HIGH (ref 150–400)
RBC: 3.48 MIL/uL — ABNORMAL LOW (ref 4.22–5.81)
RDW: 15.3 % (ref 11.5–15.5)
WBC: 12 10*3/uL — ABNORMAL HIGH (ref 4.0–10.5)

## 2017-12-15 LAB — CULTURE, BLOOD (ROUTINE X 2)
CULTURE: NO GROWTH
SPECIAL REQUESTS: ADEQUATE

## 2017-12-15 LAB — CSF CULTURE W GRAM STAIN
Culture: NO GROWTH
Gram Stain: NONE SEEN

## 2017-12-15 LAB — HEPARIN LEVEL (UNFRACTIONATED)

## 2017-12-15 LAB — VITAMIN D 25 HYDROXY (VIT D DEFICIENCY, FRACTURES): Vit D, 25-Hydroxy: 34.8 ng/mL (ref 30.0–100.0)

## 2017-12-15 LAB — CSF CULTURE

## 2017-12-15 LAB — VITAMIN B12: Vitamin B-12: 301 pg/mL (ref 180–914)

## 2017-12-15 NOTE — Progress Notes (Signed)
Pt having episodes of bradycardia and vtach per tele, pt asymptomatic, MD made aware. Will continue to monitor.

## 2017-12-15 NOTE — Progress Notes (Signed)
Called report to [facility], [facility] nurse. Removed Pt IVs, sites intact.

## 2017-12-15 NOTE — Progress Notes (Signed)
Pt d/c'd to the [facility] with all personal belongings.

## 2017-12-15 NOTE — Progress Notes (Signed)
Patient will Discharge To: [facility] Anticipated DC Date: 12-15-17 Family Notified: yes, [place] [phone] Transport By: Patsi Sears per Ander Purpura, RN   Per MD patient ready for DC to [facility]. RN, patient, patient's family, and facility notified of DC. Assessment, Fl2/Pasrr, and Discharge Summary sent to facility. RN given number for report [phone] Renee). DC packet on chart. Ambulance transport requested for patient.   CSW signing off.  Reed Breech LCSWA [phone]

## 2017-12-15 NOTE — Progress Notes (Signed)
PROGRESS NOTE  [name] FWY:[id number] DOB: [birth year] DOA: 12/10/2017 PCP: Raylene Everts, MD  Brief narrative/history of present illness Please refer to admission H&P for details, in brief, 76 year old male with history of A. Fib on anticoagulation, hypertension, hyperlipidemia presented to the ED after a fall at home. Patient also had a fall about 2-3 weeks ago without loss of consciousness,since within a started noticing swelling of his legs. Patient denied any Bowel or urinary incontinence. Patient was seen by his PCP 2 weeks back and found to be very weak and unsteady. He was planning to be referred to neurology and PT. When seen in the ED his vitals were stable. CT of the head and MRI of the brain was negative for acute findings. He was also found to have right lower leg cellulitis, Started on IV vancomycin and admitted to hospitalist service. Patient had an MRI of the Cervical spine ordered on admission which was concerning for transverse myelitis. 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 CSF fluid and ounces was normal. Neurologist at [facility] evaluated and recommended that the ataxia is likely multifactorial. Patient seen by PT and recommends SNF.  Hospital course  Ataxia with generalized weakness MRI of the lumbar spine showing patchy abnormal signal in the Lower spinal cord which is nonspecific. No signs of tumor. 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 Patient received 2 days of Prednisone.CPK normal. -ordered labs for vitamin B12 level, TSH and vitamin D levels which should be followed as outpatient. Also  Cannot rule out possibility of statin-induced myopathy completely so I will discontinue his statin for now and monitor for improvement. -B12 level pending at time of d/c -TSH and VitD normal -subjective improvement during the hospitalization  Seen by PT and recommended SNF. Neurology consulted appreciated .   Atrial fibrillation Stable on telemetry. Given his Multiple recent falls with increased fall risk, anticoagulation was discontinued. Also not on beta blocker due to sinus bradycardia at night.  Lower extremity edema Switched to scheduled Lasix po.  Cellulitis of right leg Treating with seven-day course of Keflex.Appears improved on my exam today.  PVCs with NSVT Low magnesium replenished. 2-D echo done which showed normal LVEF and no wall motion abnormality. Ordered potassium and magnesium supplement (plan to keep K >4 and Mg >2). Needs monitoring as outpatient. -for 24 hours prior to d/c no Vtach or pauses -no chronotropic incompetence   Essential hypertension Stable. Monitor on Lasix.  Sinus bradycardia Patient's heart rate drops to 40s (occasionally high 30s) mainly while asleep. 2-D echo without wall motion abnormality. Will follow as outpatient and decide on possible stress test. Patient would likely benefit from sleep study as outpatient. Avoid AV nodal blocking agents.  Iron deficiency anemia Started on iron supplement   Consults: Cardiology Neurology      Disposition Plan:   D/C SNF Family Communication:  No Family at bedside  Consultants:  Neurology, cardiology  Code Status: DNR  DVT Prophylaxis:  SCDs   Procedures: As Listed in Progress Note Above      Subjective: Patient states that he is feeling stronger since being admitted to the hospital.  But his gait is still unsteady.  He denies any chest pain, shortness breath, syncope, nausea, vomiting, diarrhea, abdominal pain, dysuria.  Objective: Vitals:   12/13/17 1503 12/13/17  2132 12/14/17 1300 12/14/17 2100  BP: 109/62 110/64 (!) 115/50 110/64  Pulse: 66 60 98 60  Resp: 18 18 18 18   Temp: 98.2 F (36.8 C) 98.4 F (36.9 C) 98.9 F (37.2 C) 98.2 F (36.8 C)  TempSrc:  Oral Oral Oral  SpO2: 98% 97% 94% 100%  Weight:      Height:        Intake/Output Summary (Last 24 hours) at 12/15/2017 0839 Last data filed at 12/14/2017 2300 Gross per 24 hour  Intake -  Output 300 ml  Net -300 ml   Weight change:  Exam:   General:  Pt is alert, follows commands appropriately, not in acute distress  HEENT: No icterus, No thrush, No neck mass, [place]/AT  Cardiovascular: RRR, S1/S2, no rubs, no gallops  Respiratory: CTA bilaterally, no wheezing, no crackles, no rhonchi  Abdomen: Soft/+BS, non tender, non distended, no guarding  Extremities: No edema, No lymphangitis, No petechiae, No rashes, no synovitis   Data Reviewed: I have personally reviewed following labs and imaging studies Basic Metabolic Panel: Recent Labs  Lab 12/10/17 0948 12/13/17 0424 12/14/17 0420  NA 133* 136 136  K 4.3 3.9 3.6  CL 100* 100* 102  CO2 28 26 24   GLUCOSE 115* 160* 123*  BUN 12 8 13   CREATININE 0.77 0.76 0.67  CALCIUM 8.2* 8.6* 8.5*  MG  --  1.8 2.7*   Liver Function Tests: Recent Labs  Lab 12/11/17 1317  ALBUMIN 2.2*   No results for input(s): LIPASE, AMYLASE in the last 168 hours. No results for input(s): AMMONIA in the last 168 hours. Coagulation Profile: No results for input(s): INR, PROTIME in the last 168 hours. CBC: Recent Labs  Lab 12/10/17 0948 12/12/17 0425 12/13/17 0424 12/14/17 0420 12/15/17 0618  WBC 10.5 8.6 6.9 14.8* 12.0*  NEUTROABS 8.1*  --   --   --   --   HGB 9.9* 9.9* 10.8* 9.5* 11.0*  HCT 31.2* 31.4* 34.9* 30.7* 35.3*  MCV 101.0* 100.6* 101.2* 100.7* 101.4*  PLT 572* 578* 610* 551* 582*   Cardiac Enzymes: Recent Labs  Lab 12/10/17 1754 12/10/17 2212 12/11/17 0422 12/11/17 2013 12/12/17 1309  CKTOTAL  --   --   --  75 79    CKMB  --   --   --  2.8  --   TROPONINI <0.03 <0.03 <0.03  --   --    BNP: Invalid input(s): POCBNP CBG: No results for input(s): GLUCAP in the last 168 hours. HbA1C: No results for input(s): HGBA1C in the last 72 hours. Urine analysis:    Component Value Date/Time   COLORURINE AMBER (A) 12/10/2017 1638   APPEARANCEUR HAZY (A) 12/10/2017 1638   LABSPEC 1.019 12/10/2017 1638   PHURINE 5.0 12/10/2017 1638   GLUCOSEU NEGATIVE 12/10/2017 1638   HGBUR NEGATIVE 12/10/2017 [place] 12/10/2017 [place] 12/10/2017 1638   PROTEINUR NEGATIVE 12/10/2017 1638   NITRITE NEGATIVE 12/10/2017 1638   LEUKOCYTESUR NEGATIVE 12/10/2017 1638   Sepsis Labs: @LABRCNTIP (procalcitonin:4,lacticidven:4) ) Recent Results (from the past 240 hour(s))  Culture, blood (routine x 2)     Status: None   Collection Time: 12/10/17  9:49 AM  Result Value Ref Range Status   Specimen Description BLOOD RIGHT ARM  Final   Special Requests  Final    BOTTLES DRAWN AEROBIC AND ANAEROBIC Blood Culture adequate volume   Culture NO GROWTH 5 DAYS  Final   Report Status 12/15/2017 FINAL  Final  CSF culture     Status: None (Preliminary result)   Collection Time: 12/11/17  3:40 PM  Result Value Ref Range Status   Specimen Description CSF  Final   Special Requests NONE  Final   Gram Stain   Final    NO ORGANISMS SEEN Performed at [facility]    Culture   Final    NO GROWTH 3 DAYS Performed at [facility], [place] [address], [place], [place] [address]    Report Status PENDING  Incomplete     Scheduled Meds: . cephALEXin  500 mg Oral Q6H  . furosemide  40 mg Intravenous Daily   Continuous Infusions:  Procedures/Studies: Dg Chest 2 View  Result Date: 12/10/2017 CLINICAL DATA:  Weakness for 2-4 weeks, worsening. EXAM: CHEST  2 VIEW COMPARISON:  None. FINDINGS: There is cardiomegaly without edema. Lungs are clear. No pneumothorax or pleural effusion. Aortic  atherosclerosis is noted. No acute bony abnormality. Remote fracture of the distal right clavicle is noted. IMPRESSION: Cardiomegaly without acute disease. Atherosclerosis. Electronically Signed   By: Inge Rise M.D.   On: 12/10/2017 10:59   Ct Head Wo Contrast  Result Date: 12/10/2017 CLINICAL DATA:  The patient suffered an episode of dizziness with a fall this morning. EXAM: CT HEAD WITHOUT CONTRAST TECHNIQUE: Contiguous axial images were obtained from the base of the skull through the vertex without intravenous contrast. COMPARISON:  Head CT scan 05/18/2017. FINDINGS: Brain: There is mild atrophy and some chronic microvascular ischemic change. No evidence of acute abnormality including hemorrhage, infarct, mass lesion, mass effect, midline shift or abnormal extra-axial fluid collection. No hydrocephalus or pneumocephalus. Vascular: Atherosclerosis noted. Skull: Intact. Sinuses/Orbits: There is partial visualization of marked mucosal thickening in the right maxillary sinus with associated wall thickening consistent with chronic change. This finding was present on the prior exam. Other: None. IMPRESSION: No acute abnormality. Mild atrophy and chronic microvascular change. Chronic right maxillary sinus disease. Electronically Signed   By: Inge Rise M.D.   On: 12/10/2017 11:16   Mr Brain Wo Contrast (neuro Protocol)  Result Date: 12/10/2017 CLINICAL DATA:  Ataxia with stroke suspected. EXAM: MRI HEAD WITHOUT CONTRAST TECHNIQUE: Multiplanar, multiecho pulse sequences of the brain and surrounding structures were obtained without intravenous contrast. COMPARISON:  Head CT from earlier today FINDINGS: Brain: No acute infarction, hemorrhage, hydrocephalus, extra-axial collection or mass lesion. Generalized cortical atrophy and mild chronic microvascular ischemic change in the cerebral white matter. Vascular: Major flow voids are preserved. Skull and upper cervical spine: Negative for marrow lesion  Sinuses/Orbits: Chronic right maxillary sinusitis with inspissated secretions and atelectasis. Chronic mastoid opacification that is improved from CT 09/05/2017 IMPRESSION: 1. Senescent changes without acute finding including infarct. 2. Chronic right maxillary sinusitis. 3. Mastoid opacification that is improved from CT 09/05/2017. Electronically Signed   By: Monte Fantasia M.D.   On: 12/10/2017 12:20   Mr Lumbar Spine Wo Contrast  Result Date: 12/11/2017 CLINICAL DATA:  76 year old male with progressive weakness for 2-4 weeks. Fall in April. EXAM: MRI LUMBAR SPINE WITHOUT CONTRAST TECHNIQUE: Multiplanar, multisequence MR imaging of the lumbar spine was performed. No intravenous contrast was administered. COMPARISON:  Lumbar radiographs 04/01/2017. FINDINGS: Segmentation: Transitional anatomy suspected with lumbarized S1 level based on the appearance of the April lumbar radiographs. Full size ribs designated at  T12 by this numbering system. Correlation with radiographs is recommended prior to any operative intervention. Alignment: Mild grade 1 anterolisthesis at both L5-S1 and S1-S2 with exaggerated lower lumbar lordosis. Mild retrolisthesis of L2 on L3. Vertebrae: Mild T12 superior endplate deformity with minimal associated superior endplate marrow edema (series 5, image 13). Normal marrow signal elsewhere in the T12 vertebral body. Chronic degenerative appearing endplate marrow signal changes at L2-L3, and also anteriorly at L1-L2. Background bone marrow signal elsewhere within normal limits. No other No marrow edema or evidence of acute osseous abnormality. Intact visible sacrum and SI joints. Conus medullaris and cauda equina: Conus extends to the L1-L2 level. There is subtle patchy T2 and STIR hyperintensity within the lower thoracic spinal cord seen at the T11-T12 level on both sagittal series 3, image 10 and axial series 6, image 1. Superimposed more central linear and intense signal abnormality in in  the cord posterior to the T12 vertebral body could be superimposed prominence of the central spinal canal. Persistent central cord T2 hyperintensity continues to the conus as seen on series 6, image 4. No cord expansion. The cauda equina nerve roots appear normal. Paraspinal and other soft tissues: Generalized bilateral patchy abnormal increased STIR signal in the bilateral posterior erector spinae muscles as seen on series 5, image 18 (on the left) and image 1 (on the right) similar indistinct bilateral medial psoas muscle signal abnormality as seen on series 5, image 17. The muscles do not appear expanded. No intramuscular fluid collection. Negative visualized abdominal viscera. Diverticulosis of the colon in the lower abdomen and pelvis. Disc levels: Age congruent lumbar spine degeneration at most levels. There is mild retrolisthesis at L2-L3 with advanced disc and endplate degeneration eccentric to the left, but no spinal stenosis. There is mild left lateral recess and left foraminal stenosis at that level. There is moderate and severe L4-L5, L5-S1 and S1-S2 facet degeneration. Bilateral degenerative L5-S1 facet joint fluid. These changes contribute to mild bilateral L4 neural foraminal stenosis, and mild L5-S1 lateral recess and foraminal stenosis. No significant spinal stenosis. IMPRESSION: 1. Patchy abnormal signal in the visible lower spinal cord and conus is nonspecific. Are there symptoms of spinal cord infarct? There is no cord expansion to suggest tumor. The differential diagnosis would include transverse myelitis. Thoracic spine MRI without and with contrast may be valuable to further evaluate spinal cord. 2. Diffuse abnormal paraspinal muscle (bilateral psoas and erector spinae muscle) compatible with nonspecific myositis such as due to mild muscle edema or denervation changes. No intramuscular fluid collection. Perhaps this is related to #1. 3. No acute osseous abnormality. Exaggerated lumbar lordosis  with multilevel mild spondylolisthesis and transitional lumbosacral anatomy. Chronic disc and endplate degeneration in the upper lumbar spine, and chronic posterior element degeneration in the lower lumbar spine but no spinal stenosis. 4. Diverticulosis of the large bowel in the lower abdomen and pelvis. Electronically Signed   By: Genevie Ann M.D.   On: 12/11/2017 11:08   Mr Thoracic Spine W Wo Contrast  Result Date: 12/11/2017 CLINICAL DATA:  Ataxia EXAM: MRI THORACIC WITHOUT AND WITH CONTRAST TECHNIQUE: Multiplanar and multiecho pulse sequences of the thoracic spine were obtained without and with intravenous contrast. CONTRAST:  10mL MULTIHANCE GADOBENATE DIMEGLUMINE 529 MG/ML IV SOLN COMPARISON:  None. FINDINGS: MRI THORACIC SPINE FINDINGS Alignment:  Normal Vertebrae: Mild chronic fracture superior endplate of Z61. No acute fracture or mass. Cord: Thoracic cord syrinx. The largest syrinx measures 2.5 mm at T6. Smaller syrinx extends down to T8.  Small syrinx again at T12. No cord mass or enhancing lesion identified. Paraspinal and other soft tissues: Negative Disc levels: No significant spinal stenosis. Mild facet degeneration in the mid and lower thoracic spine. No focal disc protrusion. 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 No underlying cord compression spinal stenosis or mass lesion identified Mild thoracic disc and facet degeneration. Electronically Signed   By: Franchot Gallo M.D.   On: 12/11/2017 14:59   Dg Hip Unilat W Or Wo Pelvis 2-3 Views Right  Result Date: 11/19/2017 CLINICAL DATA:  [place] on leg EXAM: DG HIP (WITH OR WITHOUT PELVIS) 2-3V RIGHT COMPARISON:  None. FINDINGS: No fracture or dislocation. Pubic symphysis and rami are intact. Vascular calcifications. Mild degenerative changes. IMPRESSION: No acute osseous abnormality Electronically Signed   By: Donavan Foil M.D.   On: 11/19/2017 01:01   Dg Femur Min 2 Views  Left  Result Date: 11/19/2017 CLINICAL DATA:  Fall EXAM: LEFT FEMUR 2 VIEWS COMPARISON:  None. FINDINGS: No fracture or dislocation. Status post left knee arthroplasty with normal alignment. Vascular calcifications. IMPRESSION: Status post left knee replacement. No definite acute osseous abnormality. Electronically Signed   By: Donavan Foil M.D.   On: 11/19/2017 01:03   Dg Fluoro Guide Lumbar Puncture  Result Date: 12/11/2017 CLINICAL DATA:  Ataxia, cord lesions on thoracic and lumbar MRI examinations. EXAM: DIAGNOSTIC LUMBAR PUNCTURE UNDER FLUOROSCOPIC GUIDANCE FLUOROSCOPY TIME:  Fluoroscopy Time:  0 minutes, 30 seconds Radiation Exposure Index (if provided by the fluoroscopic device): 3.2 mGy Number of Acquired Spot Images: 0 PROCEDURE: I discussed the risks (including hemorrhage, infection, headache, and nerve damage, among others), benefits, and alternatives to fluoroscopically guided lumbar puncture with the patient. We specifically discussed the high technical likelihood of success of the procedure. The patient understood and elected to undergo the procedure. Standard time-out was employed. Following sterile skin prep and local anesthetic administration consisting of 1 percent lidocaine, a 22 gauge spinal needle was advanced without difficulty into the thecal sac at the at the L3-4 level. I elected to use a slight paramedian approach due to the very small amount of interspinous space appreciable on the lumbar MRI. Clear CSF was returned. Opening pressure was not directly measured due to projected difficulty in turning the patient, but did not seem elevated given the sluggish flow of CSF. 11 cc of clear CSF was collected. The needle was subsequently removed and the skin cleansed and bandaged. No immediate complications were observed. IMPRESSION: 1. Technically successful lumbar puncture at the L3-4 level, yielding 11 cc of clear CSF sent to the lab for requested analyses. Electronically Signed   By:  Van Clines M.D.   On: 12/11/2017 16:35    Orson Eva, DO  [facility] Pager [phone]  If 7PM-7AM, please contact night-coverage [email or web address] Password [facility] 12/15/2017, 8:39 AM   LOS: 2 days

## 2017-12-16 ENCOUNTER — Encounter: Payer: Self-pay | Admitting: Internal Medicine

## 2017-12-16 ENCOUNTER — Encounter (HOSPITAL_COMMUNITY)
Admission: RE | Admit: 2017-12-16 | Discharge: 2017-12-16 | Disposition: A | Discharge status: Home / Self Care | Payer: Medicare PPO | Source: Skilled Nursing Facility | Attending: Internal Medicine | Admitting: Internal Medicine

## 2017-12-16 ENCOUNTER — Non-Acute Institutional Stay (SKILLED_NURSING_FACILITY): Payer: Medicare PPO | Admitting: Internal Medicine

## 2017-12-16 DIAGNOSIS — R609 Edema, unspecified: Secondary | ICD-10-CM

## 2017-12-16 DIAGNOSIS — R2681 Unsteadiness on feet: Secondary | ICD-10-CM | POA: Diagnosis not present

## 2017-12-16 DIAGNOSIS — L03115 Cellulitis of right lower limb: Secondary | ICD-10-CM | POA: Diagnosis not present

## 2017-12-16 DIAGNOSIS — I4891 Unspecified atrial fibrillation: Secondary | ICD-10-CM

## 2017-12-16 DIAGNOSIS — I10 Essential (primary) hypertension: Secondary | ICD-10-CM | POA: Diagnosis not present

## 2017-12-16 LAB — BASIC METABOLIC PANEL
Anion gap: 9 (ref 5–15)
BUN: 15 mg/dL (ref 6–20)
CALCIUM: 8.3 mg/dL — AB (ref 8.9–10.3)
CO2: 29 mmol/L (ref 22–32)
Chloride: 99 mmol/L — ABNORMAL LOW (ref 101–111)
Creatinine, Ser: 0.77 mg/dL (ref 0.61–1.24)
GFR calc Af Amer: >60 mL/min (ref >60)
Glucose, Bld: 95 mg/dL (ref 65–99)
Potassium: 3.5 mmol/L (ref 3.5–5.1)
SODIUM: 137 mmol/L (ref 135–145)

## 2017-12-16 LAB — CBC WITH DIFFERENTIAL/PLATELET
Basophils Absolute: 0.1 10*3/uL (ref 0.0–0.1)
Basophils Relative: 1 %
EOS ABS: 0 10*3/uL (ref 0.0–0.7)
EOS PCT: 0 %
HCT: 32.6 % — ABNORMAL LOW (ref 39.0–52.0)
Hemoglobin: 10.3 g/dL — ABNORMAL LOW (ref 13.0–17.0)
Lymphocytes Relative: 28 %
Lymphs Abs: 3.3 10*3/uL (ref 0.7–4.0)
MCH: 31.7 pg (ref 26.0–34.0)
MCHC: 31.6 g/dL (ref 30.0–36.0)
MCV: 100.3 fL — AB (ref 78.0–100.0)
MONO ABS: 1.7 10*3/uL (ref 0.1–1.0)
Monocytes Relative: 15 %
Neutro Abs: 6.5 10*3/uL (ref 1.7–7.7)
Neutrophils Relative %: 56 %
PLATELETS: 505 10*3/uL — AB (ref 150–400)
RBC: 3.25 MIL/uL — ABNORMAL LOW (ref 4.22–5.81)
RDW: 15 % (ref 11.5–15.5)
WBC: 11.7 10*3/uL — ABNORMAL HIGH (ref 4.0–10.5)

## 2017-12-16 LAB — MAGNESIUM: Magnesium: 1.7 mg/dL (ref 1.7–2.4)

## 2017-12-16 NOTE — Progress Notes (Signed)
This is an acute visit.  Level care skilled.  Facility is [facility].  Chief complaint acute visit status post hospitalization for history of weakness with falls.  History of present illness.  Patient is a 76 year old male with a history of atrial fibrillation as well as hypertension hyperlipidemia hypothyroidism.  Presented to the emergency department after a fall at home.  Apparently patient has had frequent falls recently.  Was found to be quite weak on a previous visit to his primary care provider.  CT of the head and MRI of the brain was negative for acute findings-but he was found to have a right lower leg cellulitis was started on IV vancomycin and admitted.  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  Cerebrospinal fluid was normal.  It was thought that the ataxia was likely multifactorial.  Recommendation was forced physical therapy at skilled nursing.  Regards atrial fibrillation apparently this was stable during his stay anticoagulation was not recommended secondary to his history of falls he is not on beta-blocker because of sinus bradycardia at night.  Also has a history of lower extremity edema and continues on Lasix.  Regards to cellulitis of the right leg this was treated with a 7-day course of Keflex and apparently improved.  Patient also had low magnesium that was replenished apparently did have some history of PVCs with an SVT.  Recommendation was to keep potassium greater than 4 magnesium greater than 2.  In regards to hypertension this was stable only on Lasix.  Again he does have some bradycardia and appears while asleep consideration for possible stress test as an outpatient and  possibly a sleep study.  He also had iron deficiency anemia and was started on iron  Today he has no acute complaints vital signs appear to be stable he says his main goal is to get home as soon as possible--after getting stronger  Past Medical History:  Diagnosis Date  . A-fib ([place])   . Arthritis    osteoarthritis  . Basal cell carcinoma (BCC) 08/29/2017  . Cataract   . Frequent falls   . Glaucoma   . High cholesterol   . Hypertension   . MI (myocardial infarction) ([place])   . Rheumatoid arthritis ([place])   . Thyroid disease          Past Surgical History:  Procedure Laterality Date  . CORONARY ANGIOPLASTY WITH STENT PLACEMENT    . JOINT REPLACEMENT     left knee  . REPLACEMENT TOTAL KNEE Left      reports that he quit smoking about 41 years ago. His smoking use included cigarettes. He quit after 15.00 years of use. he has never used smokeless tobacco. He reports that he does not drink alcohol or use drugs.  No Known Allergies       Family History  Problem Relation Age of Onset  . Heart attack Father 74  . Early death Father   . Alzheimer's disease Mother 70  . Early death Sister        MVA  . Hyperlipidemia Brother   . Heart disease Brother     MEDICATIONS   acetaminophen 500 MG tablet Commonly known as:  TYLENOL Take 1,000 mg by mouth every 6 (six) hours as needed.  cephALEXin 500 MG capsule Commonly known as:  KEFLEX Take 1 capsule (500 mg total) by mouth every 6 (six) hours for 2 days. Start taking on:  12/15/2017   ferrous sulfate 325 (65 FE) MG tablet Take 1 tablet (325 mg total) by mouth 2 (two) times daily with a meal.   folic acid 1 MG tablet Commonly known as:  FOLVITE Take 1 tablet (1 mg total) by mouth daily.   furosemide 40 MG tablet Commonly known as:  LASIX Take 1 tablet (40 mg total) by mouth daily. What changed:    when to take this  reasons to take this   I    levothyroxine 75 MCG  tablet Commonly known as:  SYNTHROID, LEVOTHROID Take 1 tablet (75 mcg total) by mouth daily before breakfast.   Magnesium Oxide 200 MG Tabs Take 1 tablet (200 mg total) by mouth daily.   methotrexate 2.5 MG tablet Take 15 mg by mouth every Sunday. 6 tablets on Sunday   polycarbophil 625 MG tablet Commonly known as:  FIBERCON Take 625 mg by mouth at bedtime.   potassium chloride SA 20 MEQ tablet Commonly known as:  K-DUR,KLOR-CON Take 2 tablets (40 mEq total) by mouth daily. T What changed:    how much to take  how to take this  when to take this  additional instructions   timolol 0.5 % ophthalmic solution Commonly known as:  TIMOPTIC INSTILL ONE DROP INTO EACH EYE TWICE DAILY         Review of systems.  In general is not complaining of any fever chills .  Skin does not complain of rashes or itching has some mild residual erythema lower extremities.  Head ears eyes nose mouth and throat does not complain of visual changes or sore throat.  Respiratory is not complaining of any shortness of breath or cough.  Cardiac denies chest pain does have some lower extremity edema more so on his right versus left.  GI does not complain of any abdominal discomfort nausea vomiting diarrhea constipation in fact was eating a cheese sandwich when I came in the room.  GU is not complaining of dysuria.  Musculoskeletal does not complain of joint pain does have leg weakness.  Neurologic does have weakness of his lower extremities but is not complaining of any headache dizziness or syncope or numbness.  Psych does not complain of depression or anxiety appears to be in good spirits motivated to go home   Physical exam.  Temperature is 98.4 pulse is 60 respirations 18 blood pressure is 108/70.  In general this is a pleasant elderly male in no distress well-nourished sitting comfortably in his wheelchair.  His skin is warm and dry he does have scattered seborrheic  keratosis most prominently on his face.  Eyes he has prescription lenses visual acuity appears grossly intact.  Oropharynx is clear mucous membranes moist he does have numerous extractions.  Chest is clear to auscultation there is no labored breathing.  Heart is irregular irregular rate and rhythm without murmur gallop or rub I would say he has 2+ edema on the right 1+ the left- continues to have some residual erythema question venous stasis changes lower legs bilaterally.--Pedal pulses are somewhat difficult to palpate because of the edema  Erythema is relatively cool to touch.  Abdomen is soft nontender with positive bowel sounds.  Musculoskeletal is able to move all extremities x4 does have some lower extremity weakness upper extremity strength appears to be intact-moves all extremities at  baseline.  Psych he is alert and oriented pleasant and appropriate.  Labs.  December 16, 2017.  Magnesium is 1.7  Sodium 137 potassium 3.5 BUN 15 creatinine 0.77  WBC 11.7-hemoglobin 10.3- platelets 505  Dec-- 21 2018.  Vitamin D level 34.8.  TSH-2.791.  Vitamin B12 301.  Magnesium level 2.7  .                Assessment and plan.  1.  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  2.  Atrial fibrillation not a candidate for aggressive anticoagulation secondary to fall risk- not a beta-blocker candidate secondary to bradycardia at night-nonetheless at this point appears to be rate controlled.  3.  History of right leg cellulitis - this appears to be resolving he is completing a course of Keflex he does have some increased edema on the right versus the left Will order a venous Doppler rule out  DVT  4.  History of PVCs with an SVT recommendation is to keep magnesium greater than 2 and potassium greater than 4.--Magnesium is 1.7 on lab done today will increase magnesium to 400 mg daily and recheck this on Wednesday, December 26.  Also note potassium is 3.5 will increase dose up to 40 mEq in the morning 20 mEq at night and recheck this as well on Wednesday, December 26  #5 hypertension-he is only on Lasix--at this point blood pressures appear to be stable and with systolics in the lower 662H 110-105 range--  he does not need additional blood pressure medication certainly at this point.  6.  History of sinus bradycardia heart rate dropping in the 40s sometimes high 30s while asleep- consider possible stress test as an outpatient clinically appears to be stable.--  7.  History of iron deficiency anemia he has been started on iron--hemoglobin was 10.3 today--will recheck this as well on Wednesday hemoglobin was 9.5---2 days ago however lab yesterday was 57- I suspect there is some lab variation here will see what Wednesdays result tells [place]   #8 hyperlipidemia again statins DC'd secondary to concerns of possible statin induced myopathy contributing to weakness.--Lipid panel in May 2018 showed an LDL of 48  #9 history of lower extremity edema he is on Lasix 40 mg a day will keep an eye on electrolytes--as well as renal function creatinine today was 0.77 BUN of 15 which appears stable potassium was borderline low at 3.5.  #10 history of rheumatoid arthritis continues on methotrexate q. Weekly  #11 history of hypothyroidism he is on Synthroid--TSH is within normal limits   Again he will need extensive physical therapy and will increase his magnesium and potassium at least temporarily and update labs the day after Christmas on December 26.  Also will obtain a venous Doppler rule out DVT on the right leg.  [id number] note greater than 40 minutes spent assessing patient-reviewing his  chart-reviewing his labs-and coordinating and formulating a plan of care for numerous diagnoses-of note greater than 50% of time spent coordinating plan of care         5.-

## 2017-12-19 ENCOUNTER — Non-Acute Institutional Stay (SKILLED_NURSING_FACILITY): Payer: Medicare PPO | Admitting: Internal Medicine

## 2017-12-19 ENCOUNTER — Encounter (HOSPITAL_COMMUNITY)
Admission: RE | Admit: 2017-12-19 | Discharge: 2017-12-19 | Disposition: A | Discharge status: Home / Self Care | Payer: Medicare PPO | Source: Skilled Nursing Facility | Attending: *Deleted | Admitting: *Deleted

## 2017-12-19 ENCOUNTER — Telehealth: Payer: Self-pay | Admitting: Family Medicine

## 2017-12-19 ENCOUNTER — Encounter: Payer: Self-pay | Admitting: Internal Medicine

## 2017-12-19 DIAGNOSIS — R270 Ataxia, unspecified: Secondary | ICD-10-CM

## 2017-12-19 DIAGNOSIS — G479 Sleep disorder, unspecified: Secondary | ICD-10-CM | POA: Diagnosis not present

## 2017-12-19 DIAGNOSIS — I4891 Unspecified atrial fibrillation: Secondary | ICD-10-CM

## 2017-12-19 DIAGNOSIS — R001 Bradycardia, unspecified: Secondary | ICD-10-CM

## 2017-12-19 DIAGNOSIS — I4729 Other ventricular tachycardia: Secondary | ICD-10-CM

## 2017-12-19 DIAGNOSIS — I472 Ventricular tachycardia: Secondary | ICD-10-CM

## 2017-12-19 DIAGNOSIS — E039 Hypothyroidism, unspecified: Secondary | ICD-10-CM

## 2017-12-19 LAB — BASIC METABOLIC PANEL
Anion gap: 10 (ref 5–15)
BUN: 14 mg/dL (ref 6–20)
CALCIUM: 8.6 mg/dL — AB (ref 8.9–10.3)
CHLORIDE: 96 mmol/L — AB (ref 101–111)
CO2: 27 mmol/L (ref 22–32)
CREATININE: 0.85 mg/dL (ref 0.61–1.24)
Glucose, Bld: 117 mg/dL — ABNORMAL HIGH (ref 65–99)
Potassium: 4.1 mmol/L (ref 3.5–5.1)
SODIUM: 133 mmol/L — AB (ref 135–145)

## 2017-12-19 LAB — CBC WITH DIFFERENTIAL/PLATELET
BASOS PCT: 0 %
Basophils Absolute: 0 10*3/uL (ref 0.0–0.1)
EOS ABS: 0.1 10*3/uL (ref 0.0–0.7)
EOS PCT: 1 %
HCT: 38.1 % — ABNORMAL LOW (ref 39.0–52.0)
Hemoglobin: 12 g/dL — ABNORMAL LOW (ref 13.0–17.0)
Lymphocytes Relative: 22 %
Lymphs Abs: 2.1 10*3/uL (ref 0.7–4.0)
MCH: 31.8 pg (ref 26.0–34.0)
MCHC: 31.5 g/dL (ref 30.0–36.0)
MCV: 101.1 fL — ABNORMAL HIGH (ref 78.0–100.0)
MONOS PCT: 7 %
Monocytes Absolute: 0.7 10*3/uL (ref 0.1–1.0)
NEUTROS PCT: 70 %
Neutro Abs: 6.7 10*3/uL (ref 1.7–7.7)
PLATELETS: 422 10*3/uL — AB (ref 150–400)
RBC: 3.77 MIL/uL — ABNORMAL LOW (ref 4.22–5.81)
RDW: 14.5 % (ref 11.5–15.5)
WBC: 9.7 10*3/uL (ref 4.0–10.5)

## 2017-12-19 LAB — FOLATE: Folate: 39 ng/mL (ref >5.9)

## 2017-12-19 LAB — MAGNESIUM: MAGNESIUM: 1.8 mg/dL (ref 1.7–2.4)

## 2017-12-19 LAB — VITAMIN B12: VITAMIN B 12: 292 pg/mL (ref 180–914)

## 2017-12-19 NOTE — Assessment & Plan Note (Addendum)
PT/OT at SNF Supplemental vitamin B12  one thousand milligrams daily could be considered as outpatient

## 2017-12-19 NOTE — Assessment & Plan Note (Signed)
12/19/17 rate controlled

## 2017-12-19 NOTE — Telephone Encounter (Signed)
Pt is in the [facility], cxld appt for thurs. And the dr has sent Dr Meda Coffee notes on the patient, Daughter does not need a call back, just wanted to advise

## 2017-12-19 NOTE — Assessment & Plan Note (Signed)
12/19/17 patient describes chronic cold intolerance; current TSH therapeutic

## 2017-12-19 NOTE — Assessment & Plan Note (Signed)
Dr. Meda Coffee to contact his ophthalmologist in [place] ( he can not remember specialist's name) to see if beta blocker should be changed to a different agent for his glaucoma. 30 day event monitor recommended if falls continue post discharge from SNF

## 2017-12-19 NOTE — Assessment & Plan Note (Signed)
Trial of melatonin [place]  precautions & recommendations discussed.

## 2017-12-19 NOTE — Patient Instructions (Addendum)
See assessment and plan under each diagnosis in the problem list and acutely for this visit. Diagnoses and plan discussed with his daughter Bishop Limbo. She feels he is depressed, this should be deferred to Dr. Meda Coffee, his PCP. She is also concerned about his driving. Neurologic outpatient follow-up as scheduled in February after PT/OT would be appropriate. He was seen by neuro hospitalist while at [facility]

## 2017-12-19 NOTE — Progress Notes (Signed)
This is a comprehensive admission note to [facility] performed on this date less than 30 days from date of admission. Included are preadmission medical/surgical history;reconciled medication list; family history; social history and comprehensive review of systems.  Corrections and additions to the records were documented . Comprehensive physical exam was also performed. Additionally a clinical summary was entered for each active diagnosis pertinent to this admission in the Problem List to enhance continuity of care. Code Status: DNR  [name] , [name] , MD  HPI: The patient was hospitalized 12/17-12/22/18 after falling at home, a recurrent event. He apparently had fallen 2-3 weeks prior to admission. Since that initial fall he had noticed swelling of the legs. Approximately 2 weeks prior to admission his PCP found the patient to be weak and unsteady, referral to neurology and PT was planned. In the ED 12/17 CT of the head and MRI the brain were negative for acute changes. An incidental finding with cellulitis of the right lower extremity. IV vancomycin was initiated and the patient admitted. MRI of the cervical spine was worrisome for possible transverse myelitis. Neuro hospitalist at [facility] was consulted. 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 Possible statin induced myopathy was of concern & the statin was discontinued. CK was normal. Neurology felt this was chronic and the patient did not need long-term, high-dose steroid therapy. CSF fluid analysis was normal. Ataxia was felt to be multifactorial.  A. fib was stable on telemetry; with recurrent falls and anticoagulation was discontinued. Beta blocker was not continued because of bradycardia at night with heart rates in the high 30s to 40s. Outpatient sleep study was to be considered. AV nodal blocking agents were to be avoided. His topical ophthalmologic beta blocker was continued. Apparently this is  prescribed by ophthalmologist in [place], [place]. He cannot remember that specialist's name Lower extremity edema was treated with scheduled Lasix. A seven-day course of Keflex was prescribed for the cellulitis with clinical improvement. EKG did reveal PVCs with NSVT. Magnesium was repleted. Labs predischarge revealed K+ 3.6, magnesium 2.7 calcium 8.5, hemoglobin 9.5, and hematocrit 30.7. MCV was 100.7 indicating macrocytosis. B12 level was low normal at 301. Vitamin D level was low normal PT recommended placement of SNF; TSH was therapeutic.  The patient is on magnesium oxide 200 mg daily. He was also on potassium 40 mEq in the morning and 20 mEq in the evening to 12/23 when it was changed to 20 mEq twice a day. Acetaminophen dose is 1000 mg every 6 hours, this could potentially result in a total dose of 4000 mg daily  Past medical and surgical history: Includes hypothyroidism, rheumatoid arthritis, history of myocardial infarction, glaucoma, and basal cell carcinoma. Surgeries include TKA on the left and coronary angioplasty with stent placement.  Social history: The patient quit smoking in 1977; he smoked for 15 years. He does not drink alcohol.  Family history: Reviewed  Review of systems: He denies any cardiac or neurologic prodrome prior to the 2 falls described above. He describes suboptimal sleep pattern. This is described as waking up feeling cold intermittently. He denies paroxysmal nocturnal dyspnea despite the edema. He describes chronic cold intolerance. He denies any symptoms of sleep apnea. This sleep disruption is also in the context of having lost his wife. He expresses some anxiety about having to stay in the nursing facility.  Constitutional: No fever,significant weight change Eyes: No redness, discharge, pain, vision change ENT/mouth: No nasal congestion,  purulent discharge,  earache,change in hearing ,sore throat  Cardiovascular: No chest pain,  palpitations,paroxysmal nocturnal dyspnea, claudication, edema  Respiratory: No cough, sputum production,hemoptysis, DOE , significant snoring,apnea  Gastrointestinal: No heartburn,dysphagia,abdominal pain, nausea / vomiting,rectal bleeding, melena,change in bowels Genitourinary: No dysuria,hematuria, pyuria,  incontinence, nocturia Musculoskeletal: No joint stiffness, joint swelling, weakness,pain Dermatologic: No rash, pruritus, change in appearance of skin Neurologic: No dizziness,headache,syncope, seizures, numbness , tingling Psychiatric: No significant depression,anorexia Endocrine: No change in hair/skin/ nails, excessive thirst, excessive hunger, excessive urination  Hematologic/lymphatic: No significant bruising, lymphadenopathy,abnormal bleeding Allergy/immunology: No itchy/ watery eyes, significant sneezing, urticaria, angioedema  Physical exam:  Pertinent or positive findings: Affect is flat. Slight ptosis present on the right. There is deformity of the right external ear. He has only 2 mandibular teeth remaining. He is otherwise edentulous. Heart rhythm and rate are irregular but rate is controlled. Breath sounds are decreased .He has 1+ pitting edema. There is drying and slight exfoliation over the lower legs distally. Some stasis dermatitis changes suggested more so on the right. He has a large keratosis over the right temple. Rhomberg testing reveals slight unsteadiness upon standing. His gait is slow and broad-based. General appearance:Adequately nourished; no acute distress , increased work of breathing is present.   Lymphatic: No lymphadenopathy about the head, neck, axilla . Eyes: No conjunctival inflammation or lid edema is present. There is no scleral icterus. Nose:  External nasal examination shows no deformity or inflammation. Nasal mucosa are pink and moist without lesions ,exudates Oral exam: lips and gums are healthy appearing.There is no oropharyngeal erythema or  exudate . Neck:  No thyromegaly, masses, tenderness noted.    Heart:  No gallop, murmur, click, rub .  Lungs:without wheezes, rhonchi,rales , rubs. Abdomen:Bowel sounds are normal. Abdomen is soft and nontender with no organomegaly, hernias,masses. GU: deferred  Extremities:  No cyanosis, clubbing  Neurologic exam : Strength equal  in upper & lower extremities Deep tendon reflexes are equal Skin: Warm & dry w/o tenting. No significant rash.  See clinical summary under each active problem in the Problem List with associated updated therapeutic plan

## 2017-12-19 NOTE — Assessment & Plan Note (Signed)
Monitor magnesium and potassium

## 2017-12-20 NOTE — Telephone Encounter (Signed)
I am aware  

## 2017-12-21 ENCOUNTER — Ambulatory Visit: Payer: Medicare PPO | Admitting: Family Medicine

## 2017-12-24 ENCOUNTER — Non-Acute Institutional Stay (SKILLED_NURSING_FACILITY): Payer: Medicare PPO | Admitting: Internal Medicine

## 2017-12-24 ENCOUNTER — Encounter: Payer: Self-pay | Admitting: Internal Medicine

## 2017-12-24 DIAGNOSIS — R609 Edema, unspecified: Secondary | ICD-10-CM

## 2017-12-24 DIAGNOSIS — L03115 Cellulitis of right lower limb: Secondary | ICD-10-CM | POA: Diagnosis not present

## 2017-12-24 DIAGNOSIS — I4891 Unspecified atrial fibrillation: Secondary | ICD-10-CM | POA: Diagnosis not present

## 2017-12-24 DIAGNOSIS — D509 Iron deficiency anemia, unspecified: Secondary | ICD-10-CM

## 2017-12-24 NOTE — Progress Notes (Signed)
Location:   [facility] Room Number: 129/P Place of Service:  SNF [phone]) Provider:  Connye Burkitt, MD  Patient Care Team: Raylene Everts, MD as PCP - General (Family Medicine) Gala Romney Cristopher Estimable, MD as Consulting Physician (Gastroenterology) Hennie Duos, MD as Consulting Physician (Rheumatology) Herminio Commons, MD as Attending Physician (Cardiology) Sanjuana Kava, MD as Consulting Physician (Orthopedic Surgery) Allyn Kenner, MD as Consulting Physician (Dermatology)  Extended Emergency Contact Information Primary Emergency Contact: [place] Phone: [phone] Relation: Daughter  Code Status:  Full Code Goals of care: Advanced Directive information Advanced Directives 12/24/2017  Does Patient Have a Medical Advance Directive? Yes  Type of Advance Directive (No Data)  Does patient want to make changes to medical advance directive? No - Patient declined  Copy of [place] in Chart? No - copy requested  Would patient like information on creating a medical advance directive? No - Patient declined     + Chief complaint-acute visit follow-up possible increased lower extremity erythema-edema--follow-up multiple other medical issues  HPI:  Pt is a 76 y.o. male seen today for an acute visit for possibly some increased erythema of his lower extremities-he is here for strengthening after hospitalization-he was also found to have a lower extremity cellulitis on his right leg and received vancomycin originally and was transitioned to Keflex which he completed approximately a week ago apparently there is some thought the erythema has increased somewhat   this appears to be more bilateral.  Staff thought he may have some increased edema- he did have an echo done in the hospital which showed a vigorous ejection fraction of 55-65% of the left ventricle-diastolic function could not be assessed  secondary to his history of atrial fibrillation.  He is not complaining of any chest pain or shortness of breath fever or chills.  He is on Lasix 40 mg a day with potassium supplementation with a history of chronic lower extremity edema--venous stasis  Venous Doppler was done shortly after admission which  showed a superficial clot in the greater saphenous veinon righthe did receive a 3-day course of Mobic  Other medical issues appear to be stable he does have a history of atrial fibrillation not on aggressive anticoagulation secondary to falls-he is not on a beta-blocker because of bradycardia in the past.  He also has a history of PVCs with recommendation to keep magnesium greater than 2--magnesium was 1.8 on lab done December 26 he has been started on magnesium  He also has a history of iron deficiency anemia he is on iron hemoglobin appears to improve at 12.0 on lab done December 16 we will recheck this as well.  Currently he is resting comfortably in his chair has no complaints vital signs are stable he is afebrile       Past Medical History:  Diagnosis Date  . A-fib ([place])   . Arthritis    osteoarthritis  . Basal cell carcinoma (BCC) 08/29/2017  . Cataract   . Frequent falls   . Glaucoma   . High cholesterol   . Hypertension   . MI (myocardial infarction) ([place])   . Rheumatoid arthritis ([place])   . Thyroid disease    Past Surgical History:  Procedure Laterality Date  . CORONARY ANGIOPLASTY WITH STENT PLACEMENT    . JOINT REPLACEMENT     left knee  . REPLACEMENT TOTAL KNEE Left     Allergies  Allergen Reactions  .  Metoprolol     Prescribed for A. Fib 12/17-12/22/18 nocturnal heart rates in the high 30s and 40s, beta blocker discontinued    Outpatient Encounter Medications as of 12/24/2017  Medication Sig  . acetaminophen (TYLENOL) 325 MG tablet Take 650 mg by mouth every 6 (six) hours as needed.  . cyanocobalamin 1000 MCG tablet Take 1,000 mcg by mouth daily.  .  ferrous sulfate 325 (65 FE) MG tablet Take 1 tablet (325 mg total) by mouth 2 (two) times daily with a meal.  . folic acid (FOLVITE) 1 MG tablet Take 1 tablet (1 mg total) by mouth daily.  . furosemide (LASIX) 40 MG tablet Take 1 tablet (40 mg total) by mouth daily.  Marland Kitchen levothyroxine (SYNTHROID, LEVOTHROID) 75 MCG tablet Take 1 tablet (75 mcg total) by mouth daily before breakfast.  . Magnesium 200 MG TABS Take 400 mg by mouth daily.  . Melatonin 3 MG TABS Take 3 mg by mouth at bedtime.  . methotrexate 2.5 MG tablet Take 15 mg by mouth every Sunday. 6 tablets on Sunday  . NON FORMULARY [employer] Sinus Rinse Complete ( sod chlor-bicarb-squeez bottle) packet with rinse device; amt: one packet via nasal; once a day  . polycarbophil (FIBERCON) 625 MG tablet Take 625 mg by mouth at bedtime.  . potassium chloride SA (K-DUR,KLOR-CON) 20 MEQ tablet Take 2 tablets (40 mEq total) by mouth daily. T  . potassium chloride SA (K-DUR,KLOR-CON) 20 MEQ tablet Take 20 mEq by mouth every evening.  . timolol (TIMOPTIC) 0.5 % ophthalmic solution INSTILL ONE DROP INTO EACH EYE TWICE DAILY  . triamcinolone (NASACORT ALLERGY 24HR) 55 MCG/ACT AERO nasal inhaler Place 1 spray into the nose daily.  . [DISCONTINUED] acetaminophen (TYLENOL) 500 MG tablet Take 1,000 mg by mouth every 6 (six) hours as needed.  . [DISCONTINUED] Magnesium Oxide 200 MG TABS Take 1 tablet (200 mg total) by mouth daily. (Patient taking differently: Take 2 tablets by mouth daily. )  . [DISCONTINUED] meloxicam (MOBIC) 7.5 MG tablet Take 7.5 mg by mouth daily.   No facility-administered encounter medications on file as of 12/24/2017.     Review of Systems   General is not complaining of any fever chills last listed weight was 166 pounds we will need to update this.  Skin does not really complain of any diaphoresis has some erythematous changes to lower legs.  Head ears eyes nose mouth and throat has prescription lenses is not complaining of any  visual changes or sore throat.  Respiratory is not complaining of any shortness of breath or cough.  Cardiac does not complain of chest pain has lower extremity edema there is some chronicity to this.  GI is not complaining of any nausea vomiting diarrhea constipation abdominal discomfort.  Musculoskeletal currently not complaining of any joint pain he does have some lower extremity weakness but apparently this has improved some.  Neurologic does not complain of dizziness headache but continues to have some weakness does not complain of syncope.  Psych does not complaining of feeling anxious or depressed initially when he came here apparently was hoping to be discharged rapidly but appears to be more comfortable now.    Immunization History  Administered Date(s) Administered  . Influenza Whole 08/07/2016  . Influenza,inj,Quad PF,6+ Mos 09/21/2017  . Pneumococcal Conjugate-13 11/27/2017  . Tdap 05/18/2017  . Zoster 12/10/2014   Pertinent  Health Maintenance Due  Topic Date Due  . PNA vac Low Risk Adult (2 of 2 - PPSV23) 11/27/2018  .  INFLUENZA VACCINE  Completed   Fall Risk  11/27/2017 07/10/2017 04/30/2017  Falls in the past year? Yes Yes Yes  Number falls in past yr: 2 or more 2 or more 2 or more  Injury with Fall? Yes Yes Yes  Risk Factor Category  High Fall Risk - -  Risk for fall due to : Impaired balance/gait History of fall(s) -  Follow up - Education provided -   Functional Status Survey:    Temperature is 97.9 pulse 65 respirations 18 blood pressure 129/76 Physical Exam   In general this is a pleasant elderly male in no distress sitting comfortably in his chair.  His skin is warm and dry he does hav moderate erythema of his lower legs bilaterally this does not extend to the foot or to the knee it is slightly warm to touche not really acutely tender he classifies  his legs is a little sore.  His oropharynx is clear mucous membranes moist.  Eyes he has prescription  lenses visual acuity appears grossly intact.  Chest is clear to auscultation there is no labored breathing.  Heart is regular rate and rhythm with occasional irregular beats without murmur gallop or rub I would say he has 1-2+ lower extremity edema bilaterally possibly slightly increased from initial exam--.  Pedal pulses are palpable slightly reduced on the left versus the right.  Abdomen is soft nontender with positive bowel sounds.  Musculoskeletal is able to move all extremities at baseline with some lower extremity weakness which is baseline ambulating in a wheelchair today.  Neurologic is grossly intact no lateralizing findingsSpeech is clear although it had he has been slowly.  Psych he appears grossly alert and oriented Labs reviewed: Recent Labs    12/14/17 0420 12/16/17 0315 12/19/17 1014 12/19/17 1051  NA 136 137 133*  --   K 3.6 3.5 4.1  --   CL 102 99* 96*  --   CO2 24 29 27   --   GLUCOSE 123* 95 117*  --   BUN 13 15 14   --   CREATININE 0.67 0.77 0.85  --   CALCIUM 8.5* 8.3* 8.6*  --   MG 2.7* 1.7  --  1.8   Recent Labs    04/30/17 1623 12/11/17 1317  AST 17  --   ALT 10  --   ALKPHOS 52  --   BILITOT 0.5  --   PROT 6.9  --   ALBUMIN 3.4* 2.2*   Recent Labs    12/10/17 0948  12/15/17 0618 12/16/17 0315 12/19/17 1014  WBC 10.5   < > 12.0* 11.7* 9.7  NEUTROABS 8.1*  --   --  6.5 6.7  HGB 9.9*   < > 11.0* 10.3* 12.0*  HCT 31.2*   < > 35.3* 32.6* 38.1*  MCV 101.0*   < > 101.4* 100.3* 101.1*  PLT 572*   < > 582* 505* 422*   < > = values in this interval not displayed.   Lab Results  Component Value Date   TSH 2.791 12/14/2017   No results found for: HGBA1C Lab Results  Component Value Date   CHOL 94 04/30/2017   HDL 31 (L) 04/30/2017   LDLCALC 48 04/30/2017   TRIG 73 04/30/2017   CHOLHDL 3.0 04/30/2017    Significant Diagnostic Results in last 30 days:  Dg Chest 2 View  Result Date: 12/10/2017 CLINICAL DATA:  Weakness for 2-4 weeks,  worsening. EXAM: CHEST  2 VIEW COMPARISON:  None. FINDINGS:  There is cardiomegaly without edema. Lungs are clear. No pneumothorax or pleural effusion. Aortic atherosclerosis is noted. No acute bony abnormality. Remote fracture of the distal right clavicle is noted. IMPRESSION: Cardiomegaly without acute disease. Atherosclerosis. Electronically Signed   By: Inge Rise M.D.   On: 12/10/2017 10:59   Ct Head Wo Contrast  Result Date: 12/10/2017 CLINICAL DATA:  The patient suffered an episode of dizziness with a fall this morning. EXAM: CT HEAD WITHOUT CONTRAST TECHNIQUE: Contiguous axial images were obtained from the base of the skull through the vertex without intravenous contrast. COMPARISON:  Head CT scan 05/18/2017. FINDINGS: Brain: There is mild atrophy and some chronic microvascular ischemic change. No evidence of acute abnormality including hemorrhage, infarct, mass lesion, mass effect, midline shift or abnormal extra-axial fluid collection. No hydrocephalus or pneumocephalus. Vascular: Atherosclerosis noted. Skull: Intact. Sinuses/Orbits: There is partial visualization of marked mucosal thickening in the right maxillary sinus with associated wall thickening consistent with chronic change. This finding was present on the prior exam. Other: None. IMPRESSION: No acute abnormality. Mild atrophy and chronic microvascular change. Chronic right maxillary sinus disease. Electronically Signed   By: Inge Rise M.D.   On: 12/10/2017 11:16   Mr Brain Wo Contrast (neuro Protocol)  Result Date: 12/10/2017 CLINICAL DATA:  Ataxia with stroke suspected. EXAM: MRI HEAD WITHOUT CONTRAST TECHNIQUE: Multiplanar, multiecho pulse sequences of the brain and surrounding structures were obtained without intravenous contrast. COMPARISON:  Head CT from earlier today FINDINGS: Brain: No acute infarction, hemorrhage, hydrocephalus, extra-axial collection or mass lesion. Generalized cortical atrophy and mild chronic  microvascular ischemic change in the cerebral white matter. Vascular: Major flow voids are preserved. Skull and upper cervical spine: Negative for marrow lesion Sinuses/Orbits: Chronic right maxillary sinusitis with inspissated secretions and atelectasis. Chronic mastoid opacification that is improved from CT 09/05/2017 IMPRESSION: 1. Senescent changes without acute finding including infarct. 2. Chronic right maxillary sinusitis. 3. Mastoid opacification that is improved from CT 09/05/2017. Electronically Signed   By: Monte Fantasia M.D.   On: 12/10/2017 12:20   Mr Lumbar Spine Wo Contrast  Result Date: 12/11/2017 CLINICAL DATA:  76 year old male with progressive weakness for 2-4 weeks. Fall in April. EXAM: MRI LUMBAR SPINE WITHOUT CONTRAST TECHNIQUE: Multiplanar, multisequence MR imaging of the lumbar spine was performed. No intravenous contrast was administered. COMPARISON:  Lumbar radiographs 04/01/2017. FINDINGS: Segmentation: Transitional anatomy suspected with lumbarized S1 level based on the appearance of the April lumbar radiographs. Full size ribs designated at T12 by this numbering system. Correlation with radiographs is recommended prior to any operative intervention. Alignment: Mild grade 1 anterolisthesis at both L5-S1 and S1-S2 with exaggerated lower lumbar lordosis. Mild retrolisthesis of L2 on L3. Vertebrae: Mild T12 superior endplate deformity with minimal associated superior endplate marrow edema (series 5, image 13). Normal marrow signal elsewhere in the T12 vertebral body. Chronic degenerative appearing endplate marrow signal changes at L2-L3, and also anteriorly at L1-L2. Background bone marrow signal elsewhere within normal limits. No other No marrow edema or evidence of acute osseous abnormality. Intact visible sacrum and SI joints. Conus medullaris and cauda equina: Conus extends to the L1-L2 level. There is subtle patchy T2 and STIR hyperintensity within the lower thoracic spinal cord  seen at the T11-T12 level on both sagittal series 3, image 10 and axial series 6, image 1. Superimposed more central linear and intense signal abnormality in in the cord posterior to the T12 vertebral body could be superimposed prominence of the central spinal canal. Persistent central cord  T2 hyperintensity continues to the conus as seen on series 6, image 4. No cord expansion. The cauda equina nerve roots appear normal. Paraspinal and other soft tissues: Generalized bilateral patchy abnormal increased STIR signal in the bilateral posterior erector spinae muscles as seen on series 5, image 18 (on the left) and image 1 (on the right) similar indistinct bilateral medial psoas muscle signal abnormality as seen on series 5, image 17. The muscles do not appear expanded. No intramuscular fluid collection. Negative visualized abdominal viscera. Diverticulosis of the colon in the lower abdomen and pelvis. Disc levels: Age congruent lumbar spine degeneration at most levels. There is mild retrolisthesis at L2-L3 with advanced disc and endplate degeneration eccentric to the left, but no spinal stenosis. There is mild left lateral recess and left foraminal stenosis at that level. There is moderate and severe L4-L5, L5-S1 and S1-S2 facet degeneration. Bilateral degenerative L5-S1 facet joint fluid. These changes contribute to mild bilateral L4 neural foraminal stenosis, and mild L5-S1 lateral recess and foraminal stenosis. No significant spinal stenosis. IMPRESSION: 1. Patchy abnormal signal in the visible lower spinal cord and conus is nonspecific. Are there symptoms of spinal cord infarct? There is no cord expansion to suggest tumor. The differential diagnosis would include transverse myelitis. Thoracic spine MRI without and with contrast may be valuable to further evaluate spinal cord. 2. Diffuse abnormal paraspinal muscle (bilateral psoas and erector spinae muscle) compatible with nonspecific myositis such as due to mild  muscle edema or denervation changes. No intramuscular fluid collection. Perhaps this is related to #1. 3. No acute osseous abnormality. Exaggerated lumbar lordosis with multilevel mild spondylolisthesis and transitional lumbosacral anatomy. Chronic disc and endplate degeneration in the upper lumbar spine, and chronic posterior element degeneration in the lower lumbar spine but no spinal stenosis. 4. Diverticulosis of the large bowel in the lower abdomen and pelvis. Electronically Signed   By: Genevie Ann M.D.   On: 12/11/2017 11:08   Mr Thoracic Spine W Wo Contrast  Result Date: 12/11/2017 CLINICAL DATA:  Ataxia EXAM: MRI THORACIC WITHOUT AND WITH CONTRAST TECHNIQUE: Multiplanar and multiecho pulse sequences of the thoracic spine were obtained without and with intravenous contrast. CONTRAST:  28mL MULTIHANCE GADOBENATE DIMEGLUMINE 529 MG/ML IV SOLN COMPARISON:  None. FINDINGS: MRI THORACIC SPINE FINDINGS Alignment:  Normal Vertebrae: Mild chronic fracture superior endplate of Z02. No acute fracture or mass. Cord: Thoracic cord syrinx. The largest syrinx measures 2.5 mm at T6. Smaller syrinx extends down to T8. Small syrinx again at T12. No cord mass or enhancing lesion identified. Paraspinal and other soft tissues: Negative Disc levels: No significant spinal stenosis. Mild facet degeneration in the mid and lower thoracic spine. No focal disc protrusion. 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 No underlying cord compression spinal stenosis or mass lesion identified Mild thoracic disc and facet degeneration. Electronically Signed   By: Franchot Gallo M.D.   On: 12/11/2017 14:59   Dg Fluoro Guide Lumbar Puncture  Result Date: 12/11/2017 CLINICAL DATA:  Ataxia, cord lesions on thoracic and lumbar MRI examinations. EXAM: DIAGNOSTIC LUMBAR PUNCTURE UNDER FLUOROSCOPIC GUIDANCE FLUOROSCOPY TIME:  Fluoroscopy Time:  0 minutes, 30 seconds  Radiation Exposure Index (if provided by the fluoroscopic device): 3.2 mGy Number of Acquired Spot Images: 0 PROCEDURE: I discussed the risks (including hemorrhage, infection, headache, and nerve damage, among others), benefits, and alternatives to fluoroscopically guided lumbar puncture with the patient. We specifically discussed the  high technical likelihood of success of the procedure. The patient understood and elected to undergo the procedure. Standard time-out was employed. Following sterile skin prep and local anesthetic administration consisting of 1 percent lidocaine, a 22 gauge spinal needle was advanced without difficulty into the thecal sac at the at the L3-4 level. I elected to use a slight paramedian approach due to the very small amount of interspinous space appreciable on the lumbar MRI. Clear CSF was returned. Opening pressure was not directly measured due to projected difficulty in turning the patient, but did not seem elevated given the sluggish flow of CSF. 11 cc of clear CSF was collected. The needle was subsequently removed and the skin cleansed and bandaged. No immediate complications were observed. IMPRESSION: 1. Technically successful lumbar puncture at the L3-4 level, yielding 11 cc of clear CSF sent to the lab for requested analyses. Electronically Signed   By: Van Clines M.D.   On: 12/11/2017 16:35    Assessment/Plan  #1 possibly increased lower extremity erythema-this does not look acutely changed but there is some increased erythema I would say compared to previous exam will empirically start Keflex for 5 days and monitor for any progression or changes.  Also will update a CBC with differential to look for any white count monitor vital signs monitoror any fever or chills clinically he appears to be stable   2.  In regards to lower extremity edema will order weights tomorrow and Monday Wednesday Friday notify provider if gain greater than 3 pounds-cardiac echo in  hospital showed normal ejection fraction diastolic dysfunction was difficult to assess secondary to atrial fibrillation- we will check a BNP tomorrow as well.   Dopplers were done shortly after admission on December 24 which did not show any venous clot it did show a superficial clot in the greater saphenous veinon the right  3.  Electrolyte issues-magnesium is.   l.   Slightly below recommendation to keep magnesium at 2.0--he is now on magnesium and we will recheck a magnesium level  Also noterecommendation is to keep potassium above 4 last potassium was 4.1 will recheck this as well.   4.-History of anemia thought to be iron deficiency he is on iron hemoglobin appears to be improving up to 12 we will recheck this as noted above  #5 atrial fibrillation this continues to be rate controlled --not on a beta-blocker because of bradycardia- he is not on aggressive anticoagulation because of fall risk Recent pulses have ranged from the 60s to the 80s  #6 hypertension this continues to be stable despite only being on Lasix- recent blood pressures 129/76- 113/67- 127/70-143/80   [id number]-of note greater than 35 minutes spent assessing patient discussing his status with nursing staff-reviewing his chart reviewing his labs and various investigational studies including Doppler report and echocardiogram results- and coordinating and formulating plan of care for numerous diagnoses- of note  greater than 50% of time spent coordinating plan of care

## 2017-12-25 ENCOUNTER — Encounter (HOSPITAL_COMMUNITY)
Admission: AD | Admit: 2017-12-25 | Discharge: 2017-12-25 | Disposition: A | Discharge status: Home / Self Care | Payer: Medicare PPO | Source: Skilled Nursing Facility | Attending: Internal Medicine | Admitting: Internal Medicine

## 2017-12-25 DIAGNOSIS — I10 Essential (primary) hypertension: Secondary | ICD-10-CM | POA: Insufficient documentation

## 2017-12-25 LAB — BASIC METABOLIC PANEL
Anion gap: 9 (ref 5–15)
BUN: 11 mg/dL (ref 6–20)
CHLORIDE: 101 mmol/L (ref 101–111)
CO2: 27 mmol/L (ref 22–32)
Calcium: 8.6 mg/dL — ABNORMAL LOW (ref 8.9–10.3)
Creatinine, Ser: 0.89 mg/dL (ref 0.61–1.24)
GFR calc Af Amer: >60 mL/min (ref >60)
GFR calc non Af Amer: >60 mL/min (ref >60)
GLUCOSE: 99 mg/dL (ref 65–99)
POTASSIUM: 5 mmol/L (ref 3.5–5.1)
Sodium: 137 mmol/L (ref 135–145)

## 2017-12-25 LAB — CBC WITH DIFFERENTIAL/PLATELET
Basophils Absolute: 0 10*3/uL (ref 0.0–0.1)
Basophils Relative: 1 %
EOS PCT: 2 %
Eosinophils Absolute: 0.1 10*3/uL (ref 0.0–0.7)
HCT: 36 % — ABNORMAL LOW (ref 39.0–52.0)
Hemoglobin: 11.3 g/dL — ABNORMAL LOW (ref 13.0–17.0)
LYMPHS ABS: 3 10*3/uL (ref 0.7–4.0)
LYMPHS PCT: 38 %
MCH: 31.4 pg (ref 26.0–34.0)
MCHC: 31.4 g/dL (ref 30.0–36.0)
MCV: 100 fL (ref 78.0–100.0)
MONO ABS: 0.6 10*3/uL (ref 0.1–1.0)
Monocytes Relative: 8 %
Neutro Abs: 4.2 10*3/uL (ref 1.7–7.7)
Neutrophils Relative %: 51 %
PLATELETS: 227 10*3/uL (ref 150–400)
RBC: 3.6 MIL/uL — AB (ref 4.22–5.81)
RDW: 15 % (ref 11.5–15.5)
WBC: 8 10*3/uL (ref 4.0–10.5)

## 2017-12-25 LAB — BRAIN NATRIURETIC PEPTIDE: B Natriuretic Peptide: 158 pg/mL — ABNORMAL HIGH (ref 0.0–100.0)

## 2017-12-25 LAB — MAGNESIUM: Magnesium: 2 mg/dL (ref 1.7–2.4)

## 2017-12-27 ENCOUNTER — Other Ambulatory Visit (HOSPITAL_COMMUNITY)
Admission: RE | Admit: 2017-12-27 | Discharge: 2017-12-27 | Disposition: A | Discharge status: Home / Self Care | Payer: Medicare PPO | Source: Skilled Nursing Facility | Attending: Internal Medicine | Admitting: Internal Medicine

## 2017-12-27 ENCOUNTER — Encounter: Payer: Self-pay | Admitting: Internal Medicine

## 2017-12-27 ENCOUNTER — Non-Acute Institutional Stay (SKILLED_NURSING_FACILITY): Payer: Medicare PPO | Admitting: Internal Medicine

## 2017-12-27 DIAGNOSIS — I4891 Unspecified atrial fibrillation: Secondary | ICD-10-CM | POA: Diagnosis not present

## 2017-12-27 DIAGNOSIS — L03115 Cellulitis of right lower limb: Secondary | ICD-10-CM | POA: Diagnosis not present

## 2017-12-27 DIAGNOSIS — I10 Essential (primary) hypertension: Secondary | ICD-10-CM | POA: Insufficient documentation

## 2017-12-27 DIAGNOSIS — M05741 Rheumatoid arthritis with rheumatoid factor of right hand without organ or systems involvement: Secondary | ICD-10-CM | POA: Insufficient documentation

## 2017-12-27 DIAGNOSIS — E039 Hypothyroidism, unspecified: Secondary | ICD-10-CM | POA: Diagnosis not present

## 2017-12-27 DIAGNOSIS — E785 Hyperlipidemia, unspecified: Secondary | ICD-10-CM | POA: Insufficient documentation

## 2017-12-27 DIAGNOSIS — R2681 Unsteadiness on feet: Secondary | ICD-10-CM

## 2017-12-27 LAB — BASIC METABOLIC PANEL WITH GFR
Anion gap: 11 (ref 5–15)
BUN: 12 mg/dL (ref 6–20)
CO2: 25 mmol/L (ref 22–32)
Calcium: 8.8 mg/dL — ABNORMAL LOW (ref 8.9–10.3)
Chloride: 100 mmol/L — ABNORMAL LOW (ref 101–111)
Creatinine, Ser: 0.84 mg/dL (ref 0.61–1.24)
GFR calc Af Amer: >60 mL/min (ref >60)
GFR calc non Af Amer: >60 mL/min (ref >60)
Glucose, Bld: 118 mg/dL — ABNORMAL HIGH (ref 65–99)
Potassium: 4 mmol/L (ref 3.5–5.1)
Sodium: 136 mmol/L (ref 135–145)

## 2017-12-27 NOTE — Progress Notes (Signed)
Location:   [facility] Room Number: 129/P Place of Service:  SNF (31)  Provider: Granville Lewis  PCP: Raylene Everts, MD Patient Care Team: Raylene Everts, MD as PCP - General (Family Medicine) Gala Romney Cristopher Estimable, MD as Consulting Physician (Gastroenterology) Hennie Duos, MD as Consulting Physician (Rheumatology) Herminio Commons, MD as Attending Physician (Cardiology) Sanjuana Kava, MD as Consulting Physician (Orthopedic Surgery) Allyn Kenner, MD as Consulting Physician (Dermatology)  Extended Emergency Contact Information Primary Emergency Contact: [place] Phone: [phone] Relation: Daughter  Code Status: Full Code Goals of care:  Advanced Directive information Advanced Directives 12/27/2017  Does Patient Have a Medical Advance Directive? Yes  Type of Advance Directive (No Data)  Does patient want to make changes to medical advance directive? No - Patient declined  Copy of [place] in Chart? No - copy requested  Would patient like information on creating a medical advance directive? No - Patient declined     Allergies  Allergen Reactions  . Metoprolol     Prescribed for A. Fib 12/17-12/22/18 nocturnal heart rates in the high 30s and 40s, beta blocker discontinued    Chief Complaint  Patient presents with  . Discharge Note    Discharge Visit    HPI:  77 y.o. male seen today for discharge from facility later this week. He was admitted here for rehab secondary to weakness with falls.  Before his hospitalization he had frequent falls and thought to have increased weakness as well as swelling of his legs.  CT of the head and MRI of the brain were negative for any acute changes-there was an incidental finding of right lower extremity cellulitis he did receive IV vancomycin and was discharged on p.o. Keflex which she completed-when I saw him several days ago was thought possibly  he had some increased erythema again and he is completing another course of Keflex this erythema appears to be stabilized I suspect residual venous stasis largely remains  During his hospitalization he did have an MRI of the cervical spine that was worrisome for possible transverse myelitis.  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  Neurology did not think he needed a long-term high-dose steroid therapy.  His weakness and ataxia was thought multifactorial.  He does have a history A. fib but he is not on any aggressive anticoagulation because of fall risk not on a beta-blocker because of bradycardia.  Nonetheless rate appears to be controlled.  He does have an ophthalmological beta-blocker  this will warrant follow-up by his ophthalmologist.  He does continue with lower extremity edema Dopplers have been negative for any DVT his BNP was relatively unremarkable 158 he does continue on low-dose Lasix appears to be tolerating this well and edema appears to be at baseline  I do note per hospital discharge summary recommendation was to keep his magnesium at 2 or above and potassium around 4 or above- he is on magnesium supplementation magnesium level was 2.0 on most recent lab this will warrant repeat once he is discharged by home health and primary care provider notified of results--f potassium was 4.0 on lab done today this will also warrant follow-up next week by home health   Currently he is sitting in his chair comfortably he is able to ambulate but would benefit from PT and OT with continued weakness  Per occupational therapy patient has some cognitive deficits and recommendation is strongly that patient have 24-hour supervision--my understanding his family is working on this-previously patient had lived alone-he tells me that he will be with family much of the time--again  family is working on this apparently prior to discharge.        Past Medical History:  Diagnosis Date  . A-fib ([place])   . Arthritis    osteoarthritis  . Basal cell carcinoma (BCC) 08/29/2017  . Cataract   . Frequent falls   . Glaucoma   . High cholesterol   . Hypertension   . MI (myocardial infarction) ([place])   . Rheumatoid arthritis ([place])   . Thyroid disease     Past Surgical History:  Procedure Laterality Date  . CORONARY ANGIOPLASTY WITH STENT PLACEMENT    . JOINT REPLACEMENT     left knee  . REPLACEMENT TOTAL KNEE Left       reports that he quit smoking about 42 years ago. His smoking use included cigarettes. He quit after 15.00 years of use. he has never used smokeless tobacco. He reports that he does not drink alcohol or use drugs. Social History   Socioeconomic History  . Marital status: Widowed    Spouse name: Not on file  . Number of children: 1  . Years of education: 27  . Highest education level: Not on file  Social Needs  . Financial resource strain: Not on file  . Food insecurity - worry: Not on file  . Food insecurity - inability: Not on file  . Transportation needs - medical: Not on file  . Transportation needs - non-medical: Not on file  Occupational History  . Occupation: retired    Comment: Psychologist, counselling and first aid equip  Tobacco Use  . Smoking status: Former Smoker    Years: 15.00    Types: Cigarettes    Last attempt to quit: 12/26/1975    Years since quitting: 42.0  . Smokeless tobacco: Never Used  Substance and Sexual Activity  . Alcohol use: No  . Drug use: No  . Sexual activity: Not Currently  Other Topics Concern  . Not on file  Social History Narrative   Forensic psychologist    Widow   Lives alone   Lives near [name]/daughter   Functional Status Survey:    Allergies  Allergen Reactions  . Metoprolol     Prescribed for A. Fib 12/17-12/22/18 nocturnal heart rates in the high 30s and 40s, beta blocker discontinued     Pertinent  Health Maintenance Due  Topic Date Due  . PNA vac Low Risk Adult (2 of 2 - PPSV23) 11/27/2018  . INFLUENZA VACCINE  Completed    Medications: Outpatient Encounter Medications as of 12/27/2017  Medication Sig  . acetaminophen (TYLENOL) 325 MG tablet Take 650 mg by mouth every 6 (six) hours as needed.  . cephALEXin (KEFLEX) 500 MG capsule Take 500 mg by mouth every 6 (six) hours. Take for 5 days from 12/25/2017-12/29/2017  . cyanocobalamin 1000 MCG tablet Take 1,000 mcg by mouth daily.  . ferrous sulfate 325 (65 FE) MG tablet Take 1 tablet (325 mg total) by mouth 2 (two) times daily with a meal.  . folic acid (FOLVITE) 1 MG tablet Take 1 tablet (1 mg total) by mouth daily.  . furosemide (LASIX) 40 MG tablet Take 1 tablet (40 mg total) by mouth daily.  Marland Kitchen levothyroxine (SYNTHROID, LEVOTHROID) 75 MCG tablet Take 1 tablet (75 mcg total) by  mouth daily before breakfast.  . Magnesium 200 MG TABS Take 400 mg by mouth daily.  . Melatonin 3 MG TABS Take 3 mg by mouth at bedtime.  . methotrexate 2.5 MG tablet Take 15 mg by mouth every Sunday. 6 tablets on Sunday  . NON FORMULARY Neilmed Sinus Rinse Complete ( sod chlor-bicarb-squeez bottle) packet with rinse device; amt: one packet via nasal; once a day  . polycarbophil (FIBERCON) 625 MG tablet Take 625 mg by mouth at bedtime.  . potassium chloride SA (K-DUR,KLOR-CON) 20 MEQ tablet Take 2 tablets (40 mEq total) by mouth daily. T  . potassium chloride SA (K-DUR,KLOR-CON) 20 MEQ tablet Take 20 mEq by mouth every evening.  . Probiotic Product (RISA-BID PROBIOTIC PO) Take 1 tablet by mouth twice a day for 7 days from 12/24/2017-12/31/2017  . timolol (TIMOPTIC) 0.5 % ophthalmic solution INSTILL ONE DROP INTO EACH EYE TWICE DAILY  . triamcinolone (NASACORT ALLERGY 24HR) 55 MCG/ACT AERO nasal inhaler Place 1 spray into the nose daily.   No facility-administered encounter medications on file as of 12/27/2017.      Review of Systems   In general  he says he feels well looking forward to going home.  Skin does not complain of rashes or itching he does have numerous solar-induced changes of seborrheic keratosis. Also has some erythema at this point suspect this is more venous stasis changes to his lower extremities bilaterally  Head ears eyes nose mouth and throat is not really complaining of visual changes or sore throat.  Respiratory denies shortness of breath or increased cough.  Cardiac is not complaining of any chest pain he has chronic lower extremity edema more on the right versus the left but this is not new Dopplers have been negative for DVT.  GI is not complaining of abdominal discomfort nausea vomiting diarrhea constipation.  GU denies dysuria.  Musculoskeletal continues to have weakness but is not complaining of joint pain is able to ambulate without assistance but is somewhat unsteady.  Neurologic is grossly intact he does have some lower extremity weakness does not complain of dizziness headache or syncope or numbness.  Psych not complaining of depression or anxiety apparently was somewhat anxious about staying in a nursing home initially but this appears resolved now that it appears he is going home  He does have some cognitive deficits as noted above  There were no vitals filed for this visit.  Physical Exam   Temperature is 97.5 pulse 77 respirations 19 blood pressure 127/71 O2 saturation is 94% on room air.   In general this is a pleasant elderly male in no distress.  His skin is warm and dry he does have some scattered seborrheic keratosis and solar-induced changes.  Continues to have some pale erythema at this point suspect venous stasis changes to r lower extremities bilaterally--this is not really warm to touch or tender he is completing another course of Keflex there has been no extension of the erythema I suspect the remaining erythema is chronic and venous stasis related  Oropharynx is clear mucous  membranes moist.  He is largely edentulous  Eyes has prescription lenses sclera and conjunctive are clear visual acuity appears grossly intact.  Chest is clear to auscultation there is no labored breathing  Heart is irregular irregular rate and rhythm without murmur gallop or rub has I would say 1+ lower extremity bilaterally a bit more on the right versus the left pedal pulses are palpable but reduced secondary to edema I suspect.  Abdomen is soft nontender with positive bowel sounds  Musculoskeletal is able to stand without assistance and ambulate however he is somewhat unsteady and would benefit from PT and OT.  Neurologic is grossly intact no lateralizing finding his speech is clear.  Psych he is oriented to self and place but does have some cognitive deficits was not really able to tell me the street he lives on--after about 15 minutes he did later approach me in the hallway told me the street name.  As noted by Occupational Therapy he does have some cognitive deficits and recommendation is for 24-hour surveillance at home.    Labs reviewed: Basic Metabolic Panel: Recent Labs    12/16/17 0315 12/19/17 1014 12/19/17 1051 12/25/17 0430 12/27/17 0830  NA 137 133*  --  137 136  K 3.5 4.1  --  5.0 4.0  CL 99* 96*  --  101 100*  CO2 29 27  --  27 25  GLUCOSE 95 117*  --  99 118*  BUN 15 14  --  11 12  CREATININE 0.77 0.85  --  0.89 0.84  CALCIUM 8.3* 8.6*  --  8.6* 8.8*  MG 1.7  --  1.8 2.0  --    Liver Function Tests: Recent Labs    04/30/17 1623 12/11/17 1317  AST 17  --   ALT 10  --   ALKPHOS 52  --   BILITOT 0.5  --   PROT 6.9  --   ALBUMIN 3.4* 2.2*   No results for input(s): LIPASE, AMYLASE in the last 8760 hours. No results for input(s): AMMONIA in the last 8760 hours. CBC: Recent Labs    12/16/17 0315 12/19/17 1014 12/25/17 0430  WBC 11.7* 9.7 8.0  NEUTROABS 6.5 6.7 4.2  HGB 10.3* 12.0* 11.3*  HCT 32.6* 38.1* 36.0*  MCV 100.3* 101.1* 100.0  PLT  505* 422* 227   Cardiac Enzymes: Recent Labs    12/10/17 1754 12/10/17 2212 12/11/17 0422 12/11/17 2013 12/12/17 1309  CKTOTAL  --   --   --  75 79  CKMB  --   --   --  2.8  --   TROPONINI <0.03 <0.03 <0.03  --   --    BNP: Invalid input(s): POCBNP CBG: No results for input(s): GLUCAP in the last 8760 hours.  Procedures and Imaging Studies During Stay: Dg Chest 2 View  Result Date: 12/10/2017 CLINICAL DATA:  Weakness for 2-4 weeks, worsening. EXAM: CHEST  2 VIEW COMPARISON:  None. FINDINGS: There is cardiomegaly without edema. Lungs are clear. No pneumothorax or pleural effusion. Aortic atherosclerosis is noted. No acute bony abnormality. Remote fracture of the distal right clavicle is noted. IMPRESSION: Cardiomegaly without acute disease. Atherosclerosis. Electronically Signed   By: Inge Rise M.D.   On: 12/10/2017 10:59   Ct Head Wo Contrast  Result Date: 12/10/2017 CLINICAL DATA:  The patient suffered an episode of dizziness with a fall this morning. EXAM: CT HEAD WITHOUT CONTRAST TECHNIQUE: Contiguous axial images were obtained from the base of the skull through the vertex without intravenous contrast. COMPARISON:  Head CT scan 05/18/2017. FINDINGS: Brain: There is mild atrophy and some chronic microvascular ischemic change. No evidence of acute abnormality including hemorrhage, infarct, mass lesion, mass effect, midline shift or abnormal extra-axial fluid collection. No hydrocephalus or pneumocephalus. Vascular: Atherosclerosis noted. Skull: Intact. Sinuses/Orbits: There is partial visualization of marked mucosal thickening in the right maxillary sinus with associated wall thickening consistent with chronic change. This finding  was present on the prior exam. Other: None. IMPRESSION: No acute abnormality. Mild atrophy and chronic microvascular change. Chronic right maxillary sinus disease. Electronically Signed   By: Inge Rise M.D.   On: 12/10/2017 11:16   Mr Brain Wo  Contrast (neuro Protocol)  Result Date: 12/10/2017 CLINICAL DATA:  Ataxia with stroke suspected. EXAM: MRI HEAD WITHOUT CONTRAST TECHNIQUE: Multiplanar, multiecho pulse sequences of the brain and surrounding structures were obtained without intravenous contrast. COMPARISON:  Head CT from earlier today FINDINGS: Brain: No acute infarction, hemorrhage, hydrocephalus, extra-axial collection or mass lesion. Generalized cortical atrophy and mild chronic microvascular ischemic change in the cerebral white matter. Vascular: Major flow voids are preserved. Skull and upper cervical spine: Negative for marrow lesion Sinuses/Orbits: Chronic right maxillary sinusitis with inspissated secretions and atelectasis. Chronic mastoid opacification that is improved from CT 09/05/2017 IMPRESSION: 1. Senescent changes without acute finding including infarct. 2. Chronic right maxillary sinusitis. 3. Mastoid opacification that is improved from CT 09/05/2017. Electronically Signed   By: Monte Fantasia M.D.   On: 12/10/2017 12:20   Mr Lumbar Spine Wo Contrast  Result Date: 12/11/2017 CLINICAL DATA:  77 year old male with progressive weakness for 2-4 weeks. Fall in April. EXAM: MRI LUMBAR SPINE WITHOUT CONTRAST TECHNIQUE: Multiplanar, multisequence MR imaging of the lumbar spine was performed. No intravenous contrast was administered. COMPARISON:  Lumbar radiographs 04/01/2017. FINDINGS: Segmentation: Transitional anatomy suspected with lumbarized S1 level based on the appearance of the April lumbar radiographs. Full size ribs designated at T12 by this numbering system. Correlation with radiographs is recommended prior to any operative intervention. Alignment: Mild grade 1 anterolisthesis at both L5-S1 and S1-S2 with exaggerated lower lumbar lordosis. Mild retrolisthesis of L2 on L3. Vertebrae: Mild T12 superior endplate deformity with minimal associated superior endplate marrow edema (series 5, image 13). Normal marrow signal  elsewhere in the T12 vertebral body. Chronic degenerative appearing endplate marrow signal changes at L2-L3, and also anteriorly at L1-L2. Background bone marrow signal elsewhere within normal limits. No other No marrow edema or evidence of acute osseous abnormality. Intact visible sacrum and SI joints. Conus medullaris and cauda equina: Conus extends to the L1-L2 level. There is subtle patchy T2 and STIR hyperintensity within the lower thoracic spinal cord seen at the T11-T12 level on both sagittal series 3, image 10 and axial series 6, image 1. Superimposed more central linear and intense signal abnormality in in the cord posterior to the T12 vertebral body could be superimposed prominence of the central spinal canal. Persistent central cord T2 hyperintensity continues to the conus as seen on series 6, image 4. No cord expansion. The cauda equina nerve roots appear normal. Paraspinal and other soft tissues: Generalized bilateral patchy abnormal increased STIR signal in the bilateral posterior erector spinae muscles as seen on series 5, image 18 (on the left) and image 1 (on the right) similar indistinct bilateral medial psoas muscle signal abnormality as seen on series 5, image 17. The muscles do not appear expanded. No intramuscular fluid collection. Negative visualized abdominal viscera. Diverticulosis of the colon in the lower abdomen and pelvis. Disc levels: Age congruent lumbar spine degeneration at most levels. There is mild retrolisthesis at L2-L3 with advanced disc and endplate degeneration eccentric to the left, but no spinal stenosis. There is mild left lateral recess and left foraminal stenosis at that level. There is moderate and severe L4-L5, L5-S1 and S1-S2 facet degeneration. Bilateral degenerative L5-S1 facet joint fluid. These changes contribute to mild bilateral L4 neural foraminal stenosis, and  mild L5-S1 lateral recess and foraminal stenosis. No significant spinal stenosis. IMPRESSION: 1.  Patchy abnormal signal in the visible lower spinal cord and conus is nonspecific. Are there symptoms of spinal cord infarct? There is no cord expansion to suggest tumor. The differential diagnosis would include transverse myelitis. Thoracic spine MRI without and with contrast may be valuable to further evaluate spinal cord. 2. Diffuse abnormal paraspinal muscle (bilateral psoas and erector spinae muscle) compatible with nonspecific myositis such as due to mild muscle edema or denervation changes. No intramuscular fluid collection. Perhaps this is related to #1. 3. No acute osseous abnormality. Exaggerated lumbar lordosis with multilevel mild spondylolisthesis and transitional lumbosacral anatomy. Chronic disc and endplate degeneration in the upper lumbar spine, and chronic posterior element degeneration in the lower lumbar spine but no spinal stenosis. 4. Diverticulosis of the large bowel in the lower abdomen and pelvis. Electronically Signed   By: Genevie Ann M.D.   On: 12/11/2017 11:08   Mr Thoracic Spine W Wo Contrast  Result Date: 12/11/2017 CLINICAL DATA:  Ataxia EXAM: MRI THORACIC WITHOUT AND WITH CONTRAST TECHNIQUE: Multiplanar and multiecho pulse sequences of the thoracic spine were obtained without and with intravenous contrast. CONTRAST:  71mL MULTIHANCE GADOBENATE DIMEGLUMINE 529 MG/ML IV SOLN COMPARISON:  None. FINDINGS: MRI THORACIC SPINE FINDINGS Alignment:  Normal Vertebrae: Mild chronic fracture superior endplate of E33. No acute fracture or mass. Cord: Thoracic cord syrinx. The largest syrinx measures 2.5 mm at T6. Smaller syrinx extends down to T8. Small syrinx again at T12. No cord mass or enhancing lesion identified. Paraspinal and other soft tissues: Negative Disc levels: No significant spinal stenosis. Mild facet degeneration in the mid and lower thoracic spine. No focal disc protrusion. 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 No underlying cord compression spinal stenosis or mass lesion identified Mild thoracic disc and facet degeneration. Electronically Signed   By: Franchot Gallo M.D.   On: 12/11/2017 14:59   Dg Fluoro Guide Lumbar Puncture  Result Date: 12/11/2017 CLINICAL DATA:  Ataxia, cord lesions on thoracic and lumbar MRI examinations. EXAM: DIAGNOSTIC LUMBAR PUNCTURE UNDER FLUOROSCOPIC GUIDANCE FLUOROSCOPY TIME:  Fluoroscopy Time:  0 minutes, 30 seconds Radiation Exposure Index (if provided by the fluoroscopic device): 3.2 mGy Number of Acquired Spot Images: 0 PROCEDURE: I discussed the risks (including hemorrhage, infection, headache, and nerve damage, among others), benefits, and alternatives to fluoroscopically guided lumbar puncture with the patient. We specifically discussed the high technical likelihood of success of the procedure. The patient understood and elected to undergo the procedure. Standard time-out was employed. Following sterile skin prep and local anesthetic administration consisting of 1 percent lidocaine, a 22 gauge spinal needle was advanced without difficulty into the thecal sac at the at the L3-4 level. I elected to use a slight paramedian approach due to the very small amount of interspinous space appreciable on the lumbar MRI. Clear CSF was returned. Opening pressure was not directly measured due to projected difficulty in turning the patient, but did not seem elevated given the sluggish flow of CSF. 11 cc of clear CSF was collected. The needle was subsequently removed and the skin cleansed and bandaged. No immediate complications were observed. IMPRESSION: 1. Technically successful lumbar puncture at the L3-4 level, yielding 11 cc of clear CSF sent to the lab for requested analyses. Electronically Signed   By: Van Clines M.D.   On: 12/11/2017 16:35    Assessment/Plan:   #  1-weakness-he has made some progress again he is no longer on statin secondary to concerns of some statin  myopathy.  During his hospitalization he did have an MRI of the cervical spine that was worrisome for possible transverse myelitis.  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  Neurology did not think he needed a long-term high-dose steroid therapy.  He would benefit from continued PT and OT at home.  2.  History of lower extremity cellulitis is not completed a couple courses of Keflex I suspect the remaining erythema is largely venous stasis related is not really warm or tender and has not extended any over the last few days-we did order labs white count is not elevated he has been afebrile-this will warrant monitoring however once he is discharged.  3.  History of venous stasis edema and again he is on Lasix BNP was fairly unremarkable at 158 Dopplers were negative for any DVT this appears relatively stable.  4.  History of atrial fibrillation not a good candidate for anticoagulation because of fall risk not on a beta-blocker because of bradycardia nonetheless rate appears to be controlled this appears to be stable.  5.  Hypertension this appears stable he is only on Lasix recent blood pressure is 127/71-113/67-highest one I see is 143/82.  6.  Anemia he is on iron hemoglobin appears stable at 11.3.  7.  Recommendation to keep magnesium level tomorrow above per hospital apparently did have some history of PVCs in the hospital magnesium level is now 2 he is on supplementation this will warrant an updated lab next week with primary care provider notified of results-home health can draw this next week.  8.  History of hypothyroidism TSH was normal at 2.791 he is on Synthroid.  9.  History of B12 deficiency is on supplementation this normal has been low normal as well at 292-will warrant follow-up with primary care provider.  10.-  History of rheumatoid arthritis this has been  long-term he is on methotrexate this appears stable.  11.  History of hyperlipidemia he is now off statin secondary to myopathy will warrant follow-up by primary care provider.  12.  History of cognitive deficits again recommendation by occupational therapy is for 24-hour supervision family apparently is working on this-.  Clinically he appears to be stable will have home health draw magnesium level and an updated BMP next week and notify primary care provider of results-- he will need continued PT OT as well as home health for nursing support of his multiple medical issues  CPT- 99316-of note greater than 30 minutes spent on this discharge summary- greater than 50% of time spent coordinating plan of care for numerous diagnoses

## 2017-12-31 ENCOUNTER — Encounter (HOSPITAL_COMMUNITY)
Admission: RE | Admit: 2017-12-31 | Discharge: 2017-12-31 | Disposition: A | Discharge status: Home / Self Care | Payer: Medicare PPO | Source: Skilled Nursing Facility | Attending: *Deleted | Admitting: *Deleted

## 2017-12-31 ENCOUNTER — Telehealth: Payer: Self-pay | Admitting: Family Medicine

## 2017-12-31 NOTE — Telephone Encounter (Signed)
Spoke to [place], they are going to see him weekly, x 9 weeks.

## 2017-12-31 NOTE — Telephone Encounter (Signed)
Maria from [employer] requesting verbal order for nursing to monitor edema and bradycardia for 9 weeks.  Says they drew blood work and he is also taking magnesium 250 mg tablets at home. Cb# [phone]

## 2018-01-01 ENCOUNTER — Encounter: Payer: Self-pay | Admitting: Family Medicine

## 2018-01-01 ENCOUNTER — Ambulatory Visit (INDEPENDENT_AMBULATORY_CARE_PROVIDER_SITE_OTHER): Payer: Medicare PPO | Admitting: Family Medicine

## 2018-01-01 ENCOUNTER — Other Ambulatory Visit: Payer: Self-pay

## 2018-01-01 VITALS — BP 140/60 | HR 50 | Temp 98.7°F | Resp 16 | Ht 70.0 in | Wt 170.2 lb

## 2018-01-01 DIAGNOSIS — R296 Repeated falls: Secondary | ICD-10-CM | POA: Diagnosis not present

## 2018-01-01 DIAGNOSIS — Z09 Encounter for follow-up examination after completed treatment for conditions other than malignant neoplasm: Secondary | ICD-10-CM

## 2018-01-01 DIAGNOSIS — H9193 Unspecified hearing loss, bilateral: Secondary | ICD-10-CM | POA: Diagnosis not present

## 2018-01-01 DIAGNOSIS — M6281 Muscle weakness (generalized): Secondary | ICD-10-CM

## 2018-01-01 DIAGNOSIS — R2681 Unsteadiness on feet: Secondary | ICD-10-CM

## 2018-01-01 NOTE — Patient Instructions (Signed)
1.  Talked to the [place] about getting new hearing aid batteries 2.  wear them 3.  Have Magda Paganini call me with the name of your eye doctor in [place] 4.  He lost 10 pounds, you need to increase the protein and watch the nutrition in your diet 5.  Exercise every day 6.  Need blood work today 7.  Elevate your legs as much as possible 8.  Avoid salt in your diet, and salty foods.  Read labels 9.  See me in 1 month

## 2018-01-01 NOTE — Progress Notes (Signed)
Chief Complaint  Patient presents with  . Follow-up   Patient is here for a transitional visit.  He was hospitalized on 12/15/2017 for a fall.  He was admitted because of his gait instability, weakness, and for evaluation of neurologic versus muscular disease.  After several days he was discharged to the nursing home for rehabilitation.  He was discharged from the nursing home on Sunday, 12/30/17.  He has been home 2 days.  He is here to discuss his hospital stay, changes in medicines, plan. He is doing well since home.  He has family looking in on him daily.  He is accompanied by his grandson today.  His grandson has been going every day before school and making sure he has breakfast.  He also has a daughter checking in on him daily. There is question at the nursing home whether he had cognitive impairment.  It was recommended that he be considered for 24-hour care.  To my assessment, he does not have cognitive impairment and he scored a normal 30 on his MMSE.  He does have a speech delay and hesitation, he does have hearing impairment, he appears at times not understand.  He does not have any concern about living independently.  He did have extensive physical therapy and he is doing his exercises twice a day every day since going home. He lost 10 pounds during his hospitalization.  His albumin was low.  I discussed with the and his grandson the importance of good nutrition, protein, and a balanced diet. He has pedal edema.  He is on Lasix.  I also discussed with him limiting salt.  He should not add salt to his food.  They need to read labels.  Most prepared foods and canned foods have too much salt in them.  We discussed elevation of feet and compression stockings. Patient did have spells of sinus bradycardia while hospitalized.  He was evaluated by cardiology.  It is recommended that he have a 30-day event monitor if he continues to fall post discharge. He was evaluated by neurology.  His weakness  and falls was found to be multifactorial.  He does not appear to have a neurologic condition such as Parkinson's, and does not appear to have had a stroke. He had cellulitis in his ankles.  He required a couple courses of antibiotics.  We discussed what signs to watch for for early infection.  Patient Active Problem List   Diagnosis Date Noted  . Sleep disorder 12/19/2017  . 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 Sinus bradycardia 12/14/2017  . NSVT (nonsustained ventricular tachycardia) ([place]) 12/14/2017  . Ataxia 12/10/2017  . Unsteady gait 12/10/2017  . Cellulitis of right lower extremity 12/10/2017  . Basal cell carcinoma (BCC) 08/29/2017  . Actinic keratosis 08/20/2017  . Essential hypertension 04/30/2017  . Hypothyroid 04/30/2017  . HLD (hyperlipidemia) 04/30/2017  . CAD in native artery 04/30/2017  . 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 Pedal edema 04/30/2017  . Glaucoma 04/30/2017  . Abdominal aortic atherosclerosis ([place]) 04/30/2017  . 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 Asymptomatic gallstones 04/30/2017  . Benign prostatic hyperplasia 04/30/2017  . Diverticulosis 04/30/2017  . Esophagitis 04/30/2017  . Angular cheilitis 04/30/2017  . A-fib ([place]) 04/11/2017  . Rheumatoid arthritis ([place]) 04/11/2017    Outpatient Encounter Medications as of 01/01/2018  Medication Sig  . acetaminophen (TYLENOL) 325 MG tablet Take 650 mg by mouth every 6 (six) hours as needed.  Marland Kitchen  cephALEXin (KEFLEX) 500 MG capsule Take 500 mg by mouth every 6 (six) hours. Take for 5 days from 12/25/2017-12/29/2017  . cyanocobalamin 1000 MCG tablet Take 1,000 mcg by mouth daily.  . ferrous sulfate 325 (65 FE) MG tablet Take 1 tablet (325 mg total) by mouth 2 (two) times daily with a meal.  . folic acid (FOLVITE) 1 MG tablet Take 1 tablet (1 mg total) by mouth daily.  . furosemide (LASIX) 40 MG tablet Take 1 tablet (40 mg total) by mouth daily.  Marland Kitchen levothyroxine (SYNTHROID,  LEVOTHROID) 75 MCG tablet Take 1 tablet (75 mcg total) by mouth daily before breakfast.  . Magnesium 200 MG TABS Take 400 mg by mouth daily.  . Melatonin 3 MG TABS Take 3 mg by mouth at bedtime.  . methotrexate 2.5 MG tablet Take 15 mg by mouth every Sunday. 6 tablets on Sunday  . NON FORMULARY Neilmed Sinus Rinse Complete ( sod chlor-bicarb-squeez bottle) packet with rinse device; amt: one packet via nasal; once a day  . polycarbophil (FIBERCON) 625 MG tablet Take 625 mg by mouth at bedtime.  . potassium chloride SA (K-DUR,KLOR-CON) 20 MEQ tablet Take 2 tablets (40 mEq total) by mouth daily. T  . potassium chloride SA (K-DUR,KLOR-CON) 20 MEQ tablet Take 20 mEq by mouth every evening.  . Probiotic Product (RISA-BID PROBIOTIC PO) Take 1 tablet by mouth twice a day for 7 days from 12/24/2017-12/31/2017  . timolol (TIMOPTIC) 0.5 % ophthalmic solution INSTILL ONE DROP INTO EACH EYE TWICE DAILY  . triamcinolone (NASACORT ALLERGY 24HR) 55 MCG/ACT AERO nasal inhaler Place 1 spray into the nose daily.   No facility-administered encounter medications on file as of 01/01/2018.     Allergies  Allergen Reactions  . Metoprolol     Prescribed for A. Fib 12/17-12/22/18 nocturnal heart rates in the high 30s and 40s, beta blocker discontinued    Review of Systems  Constitutional: Positive for unexpected weight change. Negative for activity change, appetite change and fatigue.       10  pounds  HENT: Positive for hearing loss. Negative for congestion and dental problem.   Eyes: Negative for photophobia and visual disturbance.  Respiratory: Negative for cough and shortness of breath.   Cardiovascular: Negative for chest pain, palpitations and leg swelling.  Genitourinary: Negative for difficulty urinating and frequency.  Musculoskeletal: Positive for arthralgias and gait problem. Negative for back pain.       No falls since going home  Neurological: Positive for weakness. Negative for dizziness, seizures,  syncope and light-headedness.  Psychiatric/Behavioral: Negative for confusion, decreased concentration, dysphoric mood and sleep disturbance.    BP 140/60 (BP Location: Left Arm, Patient Position: Sitting, Cuff Size: Normal)   Pulse (!) 50   Temp 98.7 F (37.1 C) (Temporal)   Resp 16   Ht 5\' 10"  (1.778 m)   Wt 170 lb 4 oz (77.2 kg)   SpO2 98%   BMI 24.43 kg/m   Physical Exam  Constitutional: He is oriented to person, place, and time. He appears well-developed and well-nourished. No distress.  Pleasant elderly gentleman.  Uses walker.  Small steps.  slight struggle to arise from chair  HENT:  Head: Normocephalic and atraumatic.  Mouth/Throat: Oropharynx is clear and moist.  Angular chelitis Many missing teeth  Eyes: Conjunctivae are normal. Pupils are equal, round, and reactive to light.  Neck: Normal range of motion.  Cardiovascular: Normal rate and normal heart sounds. An irregularly irregular rhythm present.  Pulmonary/Chest: Effort normal and breath  sounds normal. He has no rales.  Abdominal: Soft. Bowel sounds are normal.  Musculoskeletal:  Arthritic changes of hands.  Well healed arthroplasty scar R knee.   Lymphadenopathy:    He has no cervical adenopathy.  Neurological: He is alert and oriented to person, place, and time.  Decreased general strength  Psychiatric: He has a normal mood and affect. His behavior is normal.  Hesitant speech.     ASSESSMENT/PLAN:  1. Gait instability Physical therapy accomplished.  Patient doing home exercises.  He is now using a walker  2. Hospital discharge follow-up Chart reviewed.  Specialists reviewed.  Testing and lab tests reviewed.  Discussed with patient and family.  3. Falls frequently None since discharge  4. Muscle weakness-general Multifactorial - Magnesium - BASIC METABOLIC PANEL WITH GFR  5. Bilateral hearing loss, unspecified hearing loss type I explained to the patient that he needs to be wearing his hearing  aids.  He does have a speech delay and sometimes appears not to understand what is being said.  This is not cognitive but I think it is hearing.  He states he will talk to his daughter about getting new batteries.   Patient Instructions  1.  Talked to the [employer] about getting new hearing aid batteries 2.  wear them 3.  Have Magda Paganini call me with the name of your eye doctor in [place] 4.  He lost 10 pounds, you need to increase the protein and watch the nutrition in your diet 5.  Exercise every day 6.  Need blood work today 7.  Elevate your legs as much as possible 8.  Avoid salt in your diet, and salty foods.  Read labels 9.  See me in 1 month   Raylene Everts, MD

## 2018-01-02 ENCOUNTER — Telehealth: Payer: Self-pay

## 2018-01-02 DIAGNOSIS — I10 Essential (primary) hypertension: Secondary | ICD-10-CM

## 2018-01-02 NOTE — Telephone Encounter (Signed)
Lab orders placed.  

## 2018-01-07 ENCOUNTER — Telehealth: Payer: Self-pay | Admitting: Family Medicine

## 2018-01-07 NOTE — Telephone Encounter (Signed)
Called [facility], aware.

## 2018-01-07 NOTE — Telephone Encounter (Signed)
Yes, authorized.

## 2018-01-07 NOTE — Telephone Encounter (Signed)
Wendelyn Breslow, Occupational Therapist with [facility], left message on nurse line regarding patient. She states she is calling to get verbal authorization to treat patient in home health occupational therapy twice a week for 3 weeks. Callback# [phone] Okay to leave message.

## 2018-01-17 ENCOUNTER — Telehealth: Payer: Self-pay | Admitting: Family Medicine

## 2018-01-17 NOTE — Telephone Encounter (Signed)
°  My chart message :   Dr. Meda Coffee, [place] Face daughter here. Dad has an appointment on 01-29-18 at 1:40 and 02-01-18 at 3 pm. Does he need both? If not, can we cancel the 2-5 19?    Dad went to [facility] this summer. [phone]    He said they did not take blood today. Does he need to come back?    Thank you!  Bishop Limbo  [phone]

## 2018-01-18 ENCOUNTER — Ambulatory Visit: Payer: Medicare PPO | Admitting: Cardiovascular Disease

## 2018-01-22 DIAGNOSIS — I4891 Unspecified atrial fibrillation: Secondary | ICD-10-CM | POA: Diagnosis not present

## 2018-01-22 DIAGNOSIS — G950 Syringomyelia and syringobulbia: Secondary | ICD-10-CM | POA: Diagnosis not present

## 2018-01-22 DIAGNOSIS — I872 Venous insufficiency (chronic) (peripheral): Secondary | ICD-10-CM | POA: Diagnosis not present

## 2018-01-22 DIAGNOSIS — R270 Ataxia, unspecified: Secondary | ICD-10-CM | POA: Diagnosis not present

## 2018-01-23 ENCOUNTER — Encounter: Payer: Self-pay | Admitting: Cardiovascular Disease

## 2018-01-23 ENCOUNTER — Telehealth: Payer: Self-pay | Admitting: Family Medicine

## 2018-01-23 ENCOUNTER — Ambulatory Visit: Payer: Medicare PPO | Admitting: Cardiovascular Disease

## 2018-01-23 VITALS — BP 134/74 | HR 78 | Ht 70.0 in | Wt 168.0 lb

## 2018-01-23 DIAGNOSIS — I4729 Other ventricular tachycardia: Secondary | ICD-10-CM

## 2018-01-23 DIAGNOSIS — Z955 Presence of coronary angioplasty implant and graft: Secondary | ICD-10-CM | POA: Diagnosis not present

## 2018-01-23 DIAGNOSIS — I482 Chronic atrial fibrillation: Secondary | ICD-10-CM

## 2018-01-23 DIAGNOSIS — I10 Essential (primary) hypertension: Secondary | ICD-10-CM | POA: Diagnosis not present

## 2018-01-23 DIAGNOSIS — I25118 Atherosclerotic heart disease of native coronary artery with other forms of angina pectoris: Secondary | ICD-10-CM | POA: Diagnosis not present

## 2018-01-23 DIAGNOSIS — Z9289 Personal history of other medical treatment: Secondary | ICD-10-CM | POA: Diagnosis not present

## 2018-01-23 DIAGNOSIS — I472 Ventricular tachycardia: Secondary | ICD-10-CM

## 2018-01-23 DIAGNOSIS — I4821 Permanent atrial fibrillation: Secondary | ICD-10-CM

## 2018-01-23 NOTE — Progress Notes (Signed)
SUBJECTIVE: The patient presents for follow-up of coronary artery disease.  He has a history of coronary artery disease with remote angioplasty and stenting in 2002 in [place], [place]. He also has a history of atrial fibrillation and near-syncope. He is anticoagulated with Xarelto.  He has a history of bradycardia and falls and beta-blocker was stopped in August 2018.    He was hospitalized with weakness in December 2018.  He had multiple runs of nonsustained ventricular tachycardia.  Troponins were normal.  He did not have any chest pain or dyspnea.  Obtain an echocardiogram on 12/14/17 which demonstrated normal left ventricular systolic function, LVEF 13-24%, normal regional wall motion, mild to moderate mitral regurgitation, and severe biatrial enlargement.  He received physical therapy for gait instability.  He is here with his grandson, Wilfred Lacy, who is been helping him with his exercises at home.  Patient denies chest pain, dizziness, weakness, palpitations, shortness of breath, orthopnea, and syncope.  He said his appetite has been gradually improving.  He is feeling well.  He said prior to undergoing angioplasty and stenting in 2002, he had been watching a [employer] basketball game and they were losing which made him upset.  He remembers experiencing dizziness and vomiting.  He has not had any such symptoms since being discharged from the hospital.    Review of Systems: As per "subjective", otherwise negative.  Allergies  Allergen Reactions  . Metoprolol     Prescribed for A. Fib 12/17-12/22/18 nocturnal heart rates in the high 30s and 40s, beta blocker discontinued    Current Outpatient Medications  Medication Sig Dispense Refill  . acetaminophen (TYLENOL) 325 MG tablet Take 650 mg by mouth every 6 (six) hours as needed.    . cyanocobalamin 1000 MCG tablet Take 1,000 mcg by mouth daily.    . ferrous sulfate 325 (65 FE) MG tablet Take 1 tablet (325 mg total) by mouth 2  (two) times daily with a meal. 60 tablet 0  . folic acid (FOLVITE) 1 MG tablet Take 1 tablet (1 mg total) by mouth daily. 90 tablet 3  . furosemide (LASIX) 40 MG tablet Take 1 tablet (40 mg total) by mouth daily. 30 tablet 0  . levothyroxine (SYNTHROID, LEVOTHROID) 75 MCG tablet Take 1 tablet (75 mcg total) by mouth daily before breakfast. 90 tablet 3  . Magnesium 200 MG TABS Take 400 mg by mouth daily.    . Melatonin 3 MG TABS Take 3 mg by mouth at bedtime.    . methotrexate 2.5 MG tablet Take 15 mg by mouth every Sunday. 6 tablets on Sunday    . NON FORMULARY Neilmed Sinus Rinse Complete ( sod chlor-bicarb-squeez bottle) packet with rinse device; amt: one packet via nasal; once a day    . polycarbophil (FIBERCON) 625 MG tablet Take 625 mg by mouth at bedtime.    . potassium chloride SA (K-DUR,KLOR-CON) 20 MEQ tablet Take 2 tablets (40 mEq total) by mouth daily. T 15 tablet 0  . potassium chloride SA (K-DUR,KLOR-CON) 20 MEQ tablet Take 20 mEq by mouth every evening.    . Probiotic Product (RISA-BID PROBIOTIC PO) Take 1 tablet by mouth twice a day for 7 days from 12/24/2017-12/31/2017    . timolol (TIMOPTIC) 0.5 % ophthalmic solution INSTILL ONE DROP INTO EACH EYE TWICE DAILY    . triamcinolone (NASACORT ALLERGY 24HR) 55 MCG/ACT AERO nasal inhaler Place 1 spray into the nose daily.     No current facility-administered medications for this  visit.     Past Medical History:  Diagnosis Date  . A-fib ([place])   . Arthritis    osteoarthritis  . Basal cell carcinoma (BCC) 08/29/2017  . Cataract   . Frequent falls   . Glaucoma   . High cholesterol   . Hypertension   . MI (myocardial infarction) ([place])   . Rheumatoid arthritis ([place])   . Thyroid disease     Past Surgical History:  Procedure Laterality Date  . CORONARY ANGIOPLASTY WITH STENT PLACEMENT    . JOINT REPLACEMENT     left knee  . REPLACEMENT TOTAL KNEE Left     Social History   Socioeconomic History  . Marital status: Widowed     Spouse name: Not on file  . Number of children: 1  . Years of education: 10  . Highest education level: Not on file  Social Needs  . Financial resource strain: Not on file  . Food insecurity - worry: Not on file  . Food insecurity - inability: Not on file  . Transportation needs - medical: Not on file  . Transportation needs - non-medical: Not on file  Occupational History  . Occupation: retired    Comment: Psychologist, counselling and first aid equip  Tobacco Use  . Smoking status: Former Smoker    Years: 15.00    Types: Cigarettes    Last attempt to quit: 12/26/1975    Years since quitting: 42.1  . Smokeless tobacco: Never Used  Substance and Sexual Activity  . Alcohol use: No  . Drug use: No  . Sexual activity: Not Currently  Other Topics Concern  . Not on file  Social History Narrative   College graduate    Widow   Lives alone   Lives near [name]/daughter     Vitals:   01/23/18 0816  BP: 134/74  Pulse: 78  SpO2: 98%  Weight: 168 lb (76.2 kg)  Height: 5\' 10"  (1.778 m)    Wt Readings from Last 3 Encounters:  01/23/18 168 lb (76.2 kg)  01/01/18 170 lb 4 oz (77.2 kg)  12/10/17 179 lb (81.2 kg)     PHYSICAL EXAM General: NAD HEENT: Normal. Neck: No JVD, no thyromegaly. Lungs: Clear to auscultation bilaterally with normal respiratory effort. CV: Regular rate and irregular rhythm, normal S1/S2, no S3, no murmur.  Trace to 1+ chronic bilateral lower extremity edema.  No carotid bruit.  Bilateral venous varicosities. Abdomen: Soft, nontender, no distention.  Neurologic: Alert and oriented.  Psych: Normal affect. Skin: Normal. Musculoskeletal: No gross deformities.    ECG: Most recent ECG reviewed.   Labs: Lab Results  Component Value Date/Time   K 4.0 12/27/2017 08:30 AM   BUN 12 12/27/2017 08:30 AM   CREATININE 0.84 12/27/2017 08:30 AM   CREATININE 0.82 08/02/2017 10:31 AM   ALT 10 04/30/2017 04:23 PM   TSH 2.791 12/14/2017 04:07 PM   TSH 2.30 08/02/2017 10:31  AM   HGB 11.3 (L) 12/25/2017 04:30 AM     Lipids: Lab Results  Component Value Date/Time   LDLCALC 48 04/30/2017 04:23 PM   CHOL 94 04/30/2017 04:23 PM   TRIG 73 04/30/2017 04:23 PM   HDL 31 (L) 04/30/2017 04:23 PM       ASSESSMENT AND PLAN:  1. CAD with history of TDV:[id number] stable overall. Continue simvastatin.  He is not on systemic anticoagulation any longer due to frequent falls. Not on beta blockers due to bradycardia and falls. However, given multiple runs of nonsustained ventricular  tachycardia while hospitalized, I will obtain a Lexiscan Myoview stress test to evaluate for any significant ischemic territories. I will start aspirin 81 mg.  2. Permanent atrial fibrillation:Symptomatically stable with good heart rate control. No longer on beta blockers. He is not on systemic anticoagulation any longer due to frequent falls.  3. Bilateral leg edema: Stable.Likely dependent edema due to venous varicosities. Continue Lasix prn.  4.  Nonsustained ventricular tachycardia: Given multiple runs of nonsustained ventricular tachycardia while hospitalized, I will obtain a Lexiscan Myoview stress test to evaluate for any significant ischemic territories.  Unable to tolerate beta-blockers.    Disposition: Follow up 3 months   Kate Sable, M.D., F.A.C.C.

## 2018-01-23 NOTE — Telephone Encounter (Signed)
approved

## 2018-01-23 NOTE — Telephone Encounter (Signed)
Cancel the 01/29/18 please

## 2018-01-23 NOTE — Telephone Encounter (Signed)
Shirlee Latch, Speech Therapist with [facility], left message on nurse line regarding patient. She states she has been seeing patient therapy for cognitive issues, working an Engineer, materials and independence in the home. She feels he is making good progress and would like to continue seeing him twice a week for two weeks.   Callback# [phone], okay to leave verbal order on voicemail   Please advise if this is okay.

## 2018-01-23 NOTE — Patient Instructions (Signed)
Your physician recommends that you schedule a follow-up appointment in:  3 months with [facility]    Your physician recommends that you continue on your current medications as directed. Please refer to the Current Medication list given to you today.   If you need a refill on your cardiac medications before your next appointment, please call your pharmacy.     Your physician has requested that you have a lexiscan myoview. For further information please visit [email or web address]. Please follow instruction sheet, as given.      No lab work ordered today.     Thank you for choosing [facility] !

## 2018-01-23 NOTE — Telephone Encounter (Signed)
[name] informed.

## 2018-01-23 NOTE — Telephone Encounter (Signed)
Please cancel and inform

## 2018-01-29 ENCOUNTER — Ambulatory Visit: Payer: Medicare PPO | Admitting: Family Medicine

## 2018-01-30 ENCOUNTER — Ambulatory Visit: Payer: Medicare PPO | Admitting: Neurology

## 2018-01-30 ENCOUNTER — Encounter: Payer: Self-pay | Admitting: Neurology

## 2018-01-30 ENCOUNTER — Other Ambulatory Visit: Payer: Self-pay

## 2018-01-30 DIAGNOSIS — R269 Unspecified abnormalities of gait and mobility: Secondary | ICD-10-CM | POA: Diagnosis not present

## 2018-01-30 DIAGNOSIS — G3281 Cerebellar ataxia in diseases classified elsewhere: Secondary | ICD-10-CM

## 2018-01-30 DIAGNOSIS — R200 Anesthesia of skin: Secondary | ICD-10-CM | POA: Diagnosis not present

## 2018-01-30 NOTE — Progress Notes (Signed)
[facility]  PATIENT: [name] DOB: [birth year]  REFERRING DOCTOR OR PCP:  Dr. Meda Coffee  SOURCE:    Patient, notes from Dr. Meda Coffee, hospital admissions notes, neurology consult note, laboratory and imaging reports, MRI images on PACS.l,m _________________________________   HISTORICAL  CHIEF COMPLAINT:  Chief Complaint  Patient presents with  . Gait Disturbance    Here with dtr. Magda Paganini for eval of gait disturbance, he says for about 6 mos. or more. Dtr. sts. he has had difficulty for about 5 yrs.  Pt. sts. he feels like he tries to walk to fast, then loses his balance. Last fall was the wk. before Christmas.  Doing better with a walker. Dtr. sts. memory is a problem.  MMSE done by PCP 01/01/18 was normal.  Pt. is hard of hearing and uses hearing aids.  MOCA done today  . Memory Loss    HISTORY OF PRESENT ILLNESS:  At the pleasure seeing patient, [name], at [facility] for neurologic consultation regarding his gait disturbance.     He is a 77 year old man who was hospitalized in December after a fall.   In December, he was getting dressed and then was making his bed when he slumped backwards and fell.   There was npo LOC and no significant head trauma.   He had trouble getting up and was on the floor x 5-10 minutes.   He was able to crawl.   He pushed his Life Alert and they came to take him to the hospital.     He has had a number of falls over the past 5 years due to poor balance.   He has RA.    Balance and gait have progressively worsened.   He was using a cane at the time of the fall and after Rehab started using a walker.   He does better with the walker.    He is also doing some exercises since the Rehab stay.      He has also had mild memory and other cognitive issues, more pronounced over the last year or 2. His daughter notes that when his wife died 5 years ago for about 6-12 months he seems to be cognitively slowed but then  improved. However, he has begun to worsen  He is more forgetful and makes more errors.   There is no current depression but there may have been one 5 years ago.  He has had incontinence on rare occasioan and has 2 x nocturia but not daytime urgency.      He is on Remicade and MTX for RA.    He snores but daughter has never noted any OSA signs.     Montreal Cognitive Assessment  01/30/2018  Visuospatial/ Executive (0/5) 3  Naming (0/3) 3  Attention: Read list of digits (0/2) 2  Attention: Read list of letters (0/1) 1  Attention: Serial 7 subtraction starting at 100 (0/3) 3  Language: Repeat phrase (0/2) 2  Language : Fluency (0/1) 1  Abstraction (0/2) 2  Delayed Recall (0/5) 3  Orientation (0/6) 4  Total 24  Adjusted Score (based on education) 24     I personally reviewed the MRIs of the lumbar spine, thoracic spine and brain performed 12/11/2017. The MRI of the brain shows mild atrophy (a little more pronounced in the mesial temporal lobes)  and mild chronic microvascular ischemic changes but no acute findings. 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 The MRI of the lumbar spine read demonstrates the lower syrinx and also shows significant degenerative changes throughout. At L2-L3 there is severe loss of disc height and lateral recess stenosis on the left that could impinge the left L3 nerve root.   There is lateral recess stenosis and mild foraminal narrowing at other levels with less potential for nerve root compression. Abnormal signal within the erector spina muscles and the psoas muscle noted most on the STIR images.  Also of note, the cervical spine did not appear to have any compression on the T1 scout images performed for the thoracic spine counting.   REVIEW OF SYSTEMS: Constitutional: No fevers, chills, sweats, or change in appetite Eyes: No visual changes, double vision, eye  pain Ear, nose and throat: No hearing loss, ear pain, nasal congestion, sore throat Cardiovascular: No chest pain, palpitations Respiratory: No shortness of breath at rest or with exertion.   No wheezes.  He snores GastrointestinaI: No nausea, vomiting, diarrhea, abdominal pain, fecal incontinence Genitourinary: No dysuria, urinary retention or frequency.  No nocturia. Musculoskeletal: He reports joint pain and deformed joints in the hands and feet. He has RA. Integumentary: No rash, pruritus, skin lesions Neurological: as above Psychiatric: No depression at this time.  No anxiety Endocrine: No palpitations, diaphoresis, change in appetite, change in weigh or increased thirst Hematologic/Lymphatic: No anemia, purpura, petechiae. Allergic/Immunologic: No itchy/runny eyes, nasal congestion, recent allergic reactions, rashes  ALLERGIES: Allergies  Allergen Reactions  . Metoprolol     Prescribed for A. Fib 12/17-12/22/18 nocturnal heart rates in the high 30s and 40s, beta blocker discontinued    HOME MEDICATIONS:  Current Outpatient Medications:  .  acetaminophen (TYLENOL) 325 MG tablet, Take 650 mg by mouth every 6 (six) hours as needed., Disp: , Rfl:  .  aspirin EC 81 MG tablet, Take 81 mg by mouth daily., Disp: , Rfl:  .  cyanocobalamin 1000 MCG tablet, Take 1,000 mcg by mouth daily., Disp: , Rfl:  .  ferrous sulfate 325 (65 FE) MG tablet, Take 1 tablet (325 mg total) by mouth 2 (two) times daily with a meal., Disp: 60 tablet, Rfl: 0 .  folic acid (FOLVITE) 1 MG tablet, Take 1 tablet (1 mg total) by mouth daily., Disp: 90 tablet, Rfl: 3 .  furosemide (LASIX) 40 MG tablet, Take 1 tablet (40 mg total) by mouth daily., Disp: 30 tablet, Rfl: 0 .  levothyroxine (SYNTHROID, LEVOTHROID) 75 MCG tablet, Take 1 tablet (75 mcg total) by mouth daily before breakfast., Disp: 90 tablet, Rfl: 3 .  Magnesium 200 MG TABS, Take 400 mg by mouth daily., Disp: , Rfl:  .  Melatonin 3 MG TABS, Take 3 mg  by mouth at bedtime., Disp: , Rfl:  .  methotrexate 2.5 MG tablet, Take 15 mg by mouth every Sunday. 6 tablets on Sunday, Disp: , Rfl:  .  NON FORMULARY, Neilmed Sinus Rinse Complete ( sod chlor-bicarb-squeez bottle) packet with rinse device; amt: one packet via nasal; once a day, Disp: , Rfl:  .  polycarbophil (FIBERCON) 625 MG tablet, Take 625 mg by mouth at bedtime., Disp: , Rfl:  .  potassium chloride SA (K-DUR,KLOR-CON) 20 MEQ tablet, Take 20 mEq by mouth every evening., Disp: , Rfl:  .  timolol (TIMOPTIC) 0.5 % ophthalmic solution, INSTILL ONE DROP INTO EACH EYE TWICE DAILY, Disp: , Rfl:  .  triamcinolone (NASACORT ALLERGY 24HR) 55 MCG/ACT AERO nasal inhaler, Place  1 spray into the nose daily., Disp: , Rfl:  .  Probiotic Product (RISA-BID PROBIOTIC PO), Take 1 tablet by mouth twice a day for 7 days from 12/24/2017-12/31/2017, Disp: , Rfl:   PAST MEDICAL HISTORY: Past Medical History:  Diagnosis Date  . A-fib ([place])   . Arthritis    osteoarthritis  . Basal cell carcinoma (BCC) 08/29/2017  . Cataract   . Frequent falls   . Glaucoma   . High cholesterol   . Hypertension   . MI (myocardial infarction) ([place])   . Rheumatoid arthritis ([place])   . Thyroid disease     PAST SURGICAL HISTORY: Past Surgical History:  Procedure Laterality Date  . CORONARY ANGIOPLASTY WITH STENT PLACEMENT    . JOINT REPLACEMENT     left knee  . REPLACEMENT TOTAL KNEE Left     FAMILY HISTORY: Family History  Problem Relation Age of Onset  . Heart attack Father 53  . Early death Father   . Alzheimer's disease Mother 39  . Early death Sister        MVA  . Hyperlipidemia Brother   . Heart disease Brother     SOCIAL HISTORY:  Social History   Socioeconomic History  . Marital status: Widowed    Spouse name: Not on file  . Number of children: 1  . Years of education: 46  . Highest education level: Not on file  Social Needs  . Financial resource strain: Not on file  . Food insecurity - worry: Not  on file  . Food insecurity - inability: Not on file  . Transportation needs - medical: Not on file  . Transportation needs - non-medical: Not on file  Occupational History  . Occupation: retired    Comment: Psychologist, counselling and first aid equip  Tobacco Use  . Smoking status: Former Smoker    Years: 15.00    Types: Cigarettes    Last attempt to quit: 12/26/1975    Years since quitting: 42.1  . Smokeless tobacco: Never Used  Substance and Sexual Activity  . Alcohol use: No  . Drug use: No  . Sexual activity: Not Currently  Other Topics Concern  . Not on file  Social History Narrative   Forensic psychologist    Widow   Lives alone   Lives near [place]  There were no vitals filed for this visit.  There is no height or weight on file to calculate BMI.   General: The patient is well-developed and well-nourished and in no acute distress  Eyes:  Funduscopic exam shows normal optic discs and retinal vessels.  Neck: The neck is supple, no carotid bruits are noted.  The neck is nontender.  Cardiovascular: The heart has a regular rate and rhythm with a normal S1 and S2. There were no murmurs, gallops or rubs. Lungs are clear to auscultation.  Skin: Extremities are without significant edema.  Musculoskeletal:  Back is nontender  Neurologic Exam  Mental status: The patient is alert and oriented x 3 at the time of the examination. The patient has apparent normal recent and remote memory, with an apparently normal attention span and concentration ability.   Speech is normal.  Cranial nerves: Extraocular movements are full. Pupils are equal, round, and reactive to light and accomodation.  Visual fields are full.  Facial symmetry is present. There is good facial sensation to soft touch bilaterally.Facial strength is normal.  Trapezius and sternocleidomastoid strength is normal. No dysarthria is  noted.  The tongue is midline, and the patient has symmetric elevation of  the soft palate. No obvious hearing deficits are noted.  Motor:  Muscle bulk is normal.   Tone is normal. Strength is  5 / 5 in all 4 extremities.   Sensory: Sensory testing is intact to pinprick, soft touch and vibration sensation in the arms. He has markedly reduced vibration sensation at the toes (90% loss) and ankles (75% loss) relative to the knees. There is also reduced touch sensation in the feet but not higher.  Coordination: Cerebellar testing reveals good finger-nose-finger and heel-to-shin bilaterally.  Gait and station: The station is normal. He can walk without support and has a mildly reduced stride. He takes 6 steps to turn 180. He cannot tandem walk. He has mild retropulsion when the station is disturbed. There is no Romberg sign..   Reflexes: Deep tendon reflexes are symmetric and normal bilaterally.   Plantar responses are flexor.    DIAGNOSTIC DATA (LABS, IMAGING, TESTING) - I reviewed patient records, labs, notes, testing and imaging myself where available.  Lab Results  Component Value Date   WBC 8.0 12/25/2017   HGB 11.3 (L) 12/25/2017   HCT 36.0 (L) 12/25/2017   MCV 100.0 12/25/2017   PLT 227 12/25/2017      Component Value Date/Time   NA 136 12/27/2017 0830   K 4.0 12/27/2017 0830   CL 100 (L) 12/27/2017 0830   CO2 25 12/27/2017 0830   GLUCOSE 118 (H) 12/27/2017 0830   BUN 12 12/27/2017 0830   CREATININE 0.84 12/27/2017 0830   CREATININE 0.82 08/02/2017 1031   CALCIUM 8.8 (L) 12/27/2017 0830   PROT 6.9 04/30/2017 1623   ALBUMIN 2.2 (L) 12/11/2017 1317   AST 17 04/30/2017 1623   ALT 10 04/30/2017 1623   ALKPHOS 52 04/30/2017 1623   BILITOT 0.5 04/30/2017 1623   GFRNONAA >60 12/27/2017 0830   GFRNONAA 89 04/30/2017 1623   GFRAA >60 12/27/2017 0830   GFRAA >89 04/30/2017 1623   Lab Results  Component Value Date   CHOL 94 04/30/2017   HDL 31 (L) 04/30/2017   LDLCALC 48 04/30/2017   TRIG 73 04/30/2017   CHOLHDL 3.0 04/30/2017   No results  found for: HGBA1C Lab Results  Component Value Date   VITAMINB12 292 12/19/2017   Lab Results  Component Value Date   TSH 2.791 12/14/2017       ASSESSMENT AND PLAN  Gait disturbance - Plan: CK, Multiple Myeloma Panel (SPEP&IFE w/QIG)  Cerebellar ataxia in diseases classified elsewhere ([place]) - Plan: MR CERVICAL SPINE WO CONTRAST  Numbness - Plan: Multiple Myeloma Panel (SPEP&IFE w/QIG)   In summary, Mr. Borba is a 77 year old man who has a several year history of progressively worsening gait and a one to two-year history of mild cognitive impairment. The gait disorder is most likely multifactorial. He has known arthritis and also appears to have a polyneuropathy. We need to check an MRI of the cervical spine to make sure that there is not a myelopathy that could be contributing to his symptoms. He is advised to continue to use the walker. I discussed with him and his daughter that mild cognitive impairment, by itself, is not considered dementia but about half the people will request over the next few years to more definitive dementia such as Alzheimer's. He does have some atrophy of the MRI that is a little more pronounced in the mesial temporal lobes (hippocampal atrophy is the first evident radiographic  evidence of Alzheimer's).    That we aren't doing a drug study with mild cognitive impairment and early Alzheimer's and I would forward his name and phone number to our coordinators and they will contact the daughter. 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    I will check PEP/IEF to see if he has a paraprotein consistent with an autoimmune polyneuropathy or MGUS.  They will return to see me in 3-4 months or sooner if there are new or worsening neurologic symptoms.  For asking me to see Mr. Tith. Please let me know if I can be of further  assistance with him or other patients in the future  [phone]  Dutchess Crosland A. Felecia Shelling, MD, [facility] 03/30/6598, 35:70 PM Certified in Neurology, Clinical Neurophysiology, Sleep Medicine, Pain Medicine and Neuroimaging  [facility] [address] [place], [place] [address] [phone]

## 2018-02-01 ENCOUNTER — Ambulatory Visit: Payer: Medicare PPO | Admitting: Family Medicine

## 2018-02-04 ENCOUNTER — Ambulatory Visit (HOSPITAL_COMMUNITY): Payer: Medicare PPO

## 2018-02-04 ENCOUNTER — Encounter (HOSPITAL_COMMUNITY): Payer: Medicare PPO

## 2018-02-04 LAB — MULTIPLE MYELOMA PANEL, SERUM
ALBUMIN SERPL ELPH-MCNC: 2.9 g/dL (ref 2.9–4.4)
ALBUMIN/GLOB SERPL: 0.8 (ref 0.7–1.7)
ALPHA 1: 0.2 g/dL (ref 0.0–0.4)
ALPHA2 GLOB SERPL ELPH-MCNC: 0.6 g/dL (ref 0.4–1.0)
B-Globulin SerPl Elph-Mcnc: 1.5 g/dL — ABNORMAL HIGH (ref 0.7–1.3)
GAMMA GLOB SERPL ELPH-MCNC: 1.4 g/dL (ref 0.4–1.8)
GLOBULIN, TOTAL: 3.7 g/dL (ref 2.2–3.9)
IGG (IMMUNOGLOBIN G), SERUM: 1456 mg/dL (ref 700–1600)
IgA/Immunoglobulin A, Serum: 997 mg/dL — ABNORMAL HIGH (ref 61–437)
IgM (Immunoglobulin M), Srm: 41 mg/dL (ref 15–143)
Total Protein: 6.6 g/dL (ref 6.0–8.5)

## 2018-02-04 LAB — CK: CK TOTAL: 36 U/L (ref 24–204)

## 2018-02-05 ENCOUNTER — Encounter (HOSPITAL_BASED_OUTPATIENT_CLINIC_OR_DEPARTMENT_OTHER)
Admission: RE | Admit: 2018-02-05 | Discharge: 2018-02-05 | Disposition: A | Discharge status: Home / Self Care | Payer: Medicare PPO | Source: Ambulatory Visit | Attending: Cardiovascular Disease | Admitting: Cardiovascular Disease

## 2018-02-05 ENCOUNTER — Encounter (HOSPITAL_COMMUNITY): Payer: Self-pay

## 2018-02-05 ENCOUNTER — Encounter (HOSPITAL_COMMUNITY)
Admission: RE | Admit: 2018-02-05 | Discharge: 2018-02-05 | Disposition: A | Discharge status: Home / Self Care | Payer: Medicare PPO | Source: Ambulatory Visit | Attending: Cardiovascular Disease | Admitting: Cardiovascular Disease

## 2018-02-05 DIAGNOSIS — R9439 Abnormal result of other cardiovascular function study: Secondary | ICD-10-CM | POA: Insufficient documentation

## 2018-02-05 DIAGNOSIS — I472 Ventricular tachycardia: Secondary | ICD-10-CM | POA: Insufficient documentation

## 2018-02-05 DIAGNOSIS — I4729 Other ventricular tachycardia: Secondary | ICD-10-CM

## 2018-02-05 LAB — NM MYOCAR MULTI W/SPECT W/WALL MOTION / EF
LHR: 0.44
LVDIAVOL: 157 mL (ref 62–150)
LVSYSVOL: 94 mL
Peak HR: 68 {beats}/min
Rest HR: 45 {beats}/min
SDS: 2
SRS: 23
SSS: 25
TID: 1

## 2018-02-05 MED ORDER — TECHNETIUM TC 99M TETROFOSMIN IV KIT
30.0000 | PACK | Freq: Once | INTRAVENOUS | Status: AC | PRN
  Administered 2018-02-05: 30 via INTRAVENOUS

## 2018-02-05 MED ORDER — TECHNETIUM TC 99M TETROFOSMIN IV KIT
10.0000 | PACK | Freq: Once | INTRAVENOUS | Status: AC | PRN
  Administered 2018-02-05: 10 via INTRAVENOUS

## 2018-02-05 MED ORDER — REGADENOSON 0.4 MG/5ML IV SOLN
INTRAVENOUS | Status: AC
  Administered 2018-02-05: 0.4 mg via INTRAVENOUS
  Filled 2018-02-05: qty 5

## 2018-02-05 MED ORDER — SODIUM CHLORIDE 0.9% FLUSH
INTRAVENOUS | Status: AC
  Administered 2018-02-05: 10 mL via INTRAVENOUS
  Filled 2018-02-05: qty 10

## 2018-02-05 MED ORDER — SODIUM CHLORIDE 0.9% FLUSH
INTRAVENOUS | Status: AC
  Filled 2018-02-05: qty 180

## 2018-02-07 ENCOUNTER — Ambulatory Visit (INDEPENDENT_AMBULATORY_CARE_PROVIDER_SITE_OTHER): Payer: Medicare PPO | Admitting: Family Medicine

## 2018-02-07 ENCOUNTER — Encounter: Payer: Self-pay | Admitting: Family Medicine

## 2018-02-07 VITALS — BP 118/74 | HR 56 | Resp 16 | Ht 70.0 in | Wt 169.0 lb

## 2018-02-07 DIAGNOSIS — I10 Essential (primary) hypertension: Secondary | ICD-10-CM

## 2018-02-07 DIAGNOSIS — R2681 Unsteadiness on feet: Secondary | ICD-10-CM | POA: Diagnosis not present

## 2018-02-07 DIAGNOSIS — R296 Repeated falls: Secondary | ICD-10-CM | POA: Diagnosis not present

## 2018-02-07 NOTE — Patient Instructions (Signed)
You are doing well    Follow up in 3 months

## 2018-02-07 NOTE — Progress Notes (Signed)
Chief Complaint  Patient presents with  . Hypothyroidism    follow up visit   . Hyperlipidemia  Patient is here for follow-up. He is compliant with his exercises.  He states he thinks he is gaining in strength.  He has not had any falls since his last evaluation. He is eating better.  His weight is stable. Since I saw him last he has had a follow-up with cardiology.  Dr. Bobby Rumpf plans additional testing. He also saw Dr. Felecia Shelling in neurology.  Both of these consults are evaluated, discussed with patient, and appreciated.  He has an MRI scheduled for his cervical spine in 2 weeks.    Patient Active Problem List   Diagnosis Date Noted  . Sleep disorder 12/19/2017  . 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 Sinus bradycardia 12/14/2017  . NSVT (nonsustained ventricular tachycardia) ([place]) 12/14/2017  . Ataxia 12/10/2017  . Unsteady gait 12/10/2017  . Cellulitis of right lower extremity 12/10/2017  . Basal cell carcinoma (BCC) 08/29/2017  . Actinic keratosis 08/20/2017  . Essential hypertension 04/30/2017  . Hypothyroid 04/30/2017  . HLD (hyperlipidemia) 04/30/2017  . CAD in native artery 04/30/2017  . 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 Pedal edema 04/30/2017  . Glaucoma 04/30/2017  . Abdominal aortic atherosclerosis ([place]) 04/30/2017  . 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 Asymptomatic gallstones 04/30/2017  . Benign prostatic hyperplasia 04/30/2017  . Diverticulosis 04/30/2017  . Esophagitis 04/30/2017  . Angular cheilitis 04/30/2017  . A-fib ([place]) 04/11/2017  . Rheumatoid arthritis ([place]) 04/11/2017    Outpatient Encounter Medications as of 02/07/2018  Medication Sig  . acetaminophen (TYLENOL) 325 MG tablet Take 650 mg by mouth every 6 (six) hours as needed.  Marland Kitchen aspirin EC 81 MG tablet Take 81 mg by mouth daily.  . cyanocobalamin 1000 MCG tablet Take 1,000 mcg by mouth daily.  . ferrous sulfate 325 (65 FE) MG tablet Take 1 tablet  (325 mg total) by mouth 2 (two) times daily with a meal.  . folic acid (FOLVITE) 1 MG tablet Take 1 tablet (1 mg total) by mouth daily.  . furosemide (LASIX) 40 MG tablet Take 1 tablet (40 mg total) by mouth daily.  Marland Kitchen levothyroxine (SYNTHROID, LEVOTHROID) 75 MCG tablet Take 1 tablet (75 mcg total) by mouth daily before breakfast.  . Magnesium 200 MG TABS Take 400 mg by mouth daily.  . Melatonin 3 MG TABS Take 3 mg by mouth at bedtime.  . methotrexate 2.5 MG tablet Take 15 mg by mouth every Sunday. 6 tablets on Sunday  . NON FORMULARY Neilmed Sinus Rinse Complete ( sod chlor-bicarb-squeez bottle) packet with rinse device; amt: one packet via nasal; once a day  . polycarbophil (FIBERCON) 625 MG tablet Take 625 mg by mouth at bedtime.  . potassium chloride SA (K-DUR,KLOR-CON) 20 MEQ tablet Take 20 mEq by mouth every evening.  . Probiotic Product (RISA-BID PROBIOTIC PO) Take 1 tablet by mouth twice a day for 7 days from 12/24/2017-12/31/2017  . timolol (TIMOPTIC) 0.5 % ophthalmic solution INSTILL ONE DROP INTO EACH EYE TWICE DAILY  . triamcinolone (NASACORT ALLERGY 24HR) 55 MCG/ACT AERO nasal inhaler Place 1 spray into the nose daily.   No facility-administered encounter medications on file as of 02/07/2018.     Allergies  Allergen Reactions  . Metoprolol     Prescribed for A. Fib 12/17-12/22/18 nocturnal heart rates in the high 30s and 40s, beta blocker discontinued    Review of Systems  Constitutional: Negative for activity change, appetite change, fatigue and unexpected weight change.       Weight stable  HENT: Positive for hearing loss. Negative for congestion and dental problem.        Partially corrected with hearing aids  Eyes: Negative for photophobia and visual disturbance.  Respiratory: Negative for cough and shortness of breath.   Cardiovascular: Negative for chest pain, palpitations and leg swelling.  Genitourinary: Negative for difficulty urinating and frequency.    Musculoskeletal: Positive for arthralgias and gait problem. Negative for back pain.       No falls since going home  Neurological: Positive for weakness and numbness. Negative for dizziness, seizures, syncope and light-headedness.       Neuropathy by neurologic exam  Psychiatric/Behavioral: Negative for confusion, decreased concentration, dysphoric mood and sleep disturbance.    BP 118/74   Pulse (!) 56   Resp 16   Ht 5\' 10"  (1.778 m)   Wt 169 lb (76.7 kg)   SpO2 98%   BMI 24.25 kg/m   Physical Exam  Constitutional: He is oriented to person, place, and time. He appears well-developed and well-nourished. No distress.  Pleasant elderly gentleman.  Uses walker.  Small steps.  slight struggle to arise from chair  HENT:  Head: Normocephalic and atraumatic.  Mouth/Throat: Oropharynx is clear and moist.  Angular chelitis Many missing teeth  Eyes: Conjunctivae are normal. Pupils are equal, round, and reactive to light.  Neck: Normal range of motion.  Cardiovascular: Regular rhythm and normal heart sounds.  Bradycardia  Pulmonary/Chest: Effort normal and breath sounds normal. He has no rales.  Abdominal: Soft. Bowel sounds are normal.  Musculoskeletal: Normal range of motion. He exhibits no edema.  Lymphadenopathy:    He has no cervical adenopathy.  Neurological: He is alert and oriented to person, place, and time.  Decreased general strength  Psychiatric: He has a normal mood and affect. His behavior is normal.  Hesitant speech.     ASSESSMENT/PLAN:  1. Essential hypertension Well-controlled.  2. Gait instability Improving with PT.  Still under investigation.  3. Falls frequently No falls since going home from hospital.   Patient Instructions  You are doing well.  Follow-up in 3 months   Raylene Everts, MD

## 2018-02-09 ENCOUNTER — Other Ambulatory Visit: Payer: Medicare PPO

## 2018-02-12 ENCOUNTER — Telehealth: Payer: Self-pay | Admitting: *Deleted

## 2018-02-12 NOTE — Telephone Encounter (Signed)
LMOM (identified vm) with below lab results.  She does not need to return this call unless she has questions/fim 

## 2018-02-12 NOTE — Telephone Encounter (Signed)
-----   Message from Britt Bottom, MD sent at 02/11/2018  3:09 PM EST ----- Please let him know that the blood work was okay. No abnormality commonly associated with polyneuropathy

## 2018-02-18 ENCOUNTER — Ambulatory Visit: Admission: RE | Admit: 2018-02-18 | Payer: Medicare PPO | Source: Ambulatory Visit

## 2018-02-27 ENCOUNTER — Telehealth: Payer: Self-pay | Admitting: Cardiovascular Disease

## 2018-02-27 DIAGNOSIS — Z79899 Other long term (current) drug therapy: Secondary | ICD-10-CM

## 2018-02-27 NOTE — Telephone Encounter (Signed)
Daughter states that pt was discharged from [facility] on 12/30/17. Orders from [facility] state to give K-Dur 20 mEq TID. Daughter states that when she went to refill med today she was told it was too soon and that pt was only to have one tablet daily. Bernerd Pho, PA-C notified and verbal order for BMET and hold [place] until labs are resulted. Daughter informed and stated that she would have pt at lab tomorrow at 0830. Order for BMET placed.

## 2018-02-27 NOTE — Telephone Encounter (Signed)
Please call for dosage clarification of Potassium. / tg

## 2018-02-28 ENCOUNTER — Other Ambulatory Visit (HOSPITAL_COMMUNITY)
Admission: RE | Admit: 2018-02-28 | Discharge: 2018-02-28 | Disposition: A | Discharge status: Home / Self Care | Payer: Medicare PPO | Source: Ambulatory Visit | Attending: Student | Admitting: Student

## 2018-02-28 ENCOUNTER — Telehealth: Payer: Self-pay | Admitting: *Deleted

## 2018-02-28 DIAGNOSIS — Z79899 Other long term (current) drug therapy: Secondary | ICD-10-CM | POA: Insufficient documentation

## 2018-02-28 LAB — BASIC METABOLIC PANEL
Anion gap: 9 (ref 5–15)
BUN: 13 mg/dL (ref 6–20)
CHLORIDE: 100 mmol/L — AB (ref 101–111)
CO2: 28 mmol/L (ref 22–32)
CREATININE: 0.8 mg/dL (ref 0.61–1.24)
Calcium: 8.9 mg/dL (ref 8.9–10.3)
GFR calc non Af Amer: >60 mL/min (ref >60)
Glucose, Bld: 125 mg/dL — ABNORMAL HIGH (ref 65–99)
POTASSIUM: 4 mmol/L (ref 3.5–5.1)
SODIUM: 137 mmol/L (ref 135–145)

## 2018-02-28 MED ORDER — POTASSIUM CHLORIDE CRYS ER 20 MEQ PO TBCR: 20.0000 meq | EXTENDED_RELEASE_TABLET | Freq: Two times a day (BID) | ORAL | 3 refills | Status: DC

## 2018-02-28 NOTE — Telephone Encounter (Signed)
-----   Message from Erma Heritage, [place] sent at 02/28/2018  9:08 AM EST ----- Please let the patient and his daughter know that his potassium was stable at 4.0. Kidney function and electrolytes within normal limits. If only taking Lasix 40mg  daily and with K+ stable, would inform him to take K-dur 47mEq BID. Would plan for a repeat BMET in 1 month.

## 2018-02-28 NOTE — Telephone Encounter (Signed)
Called patient with test results. No answer. Left message to call back.  

## 2018-02-28 NOTE — Telephone Encounter (Signed)
-----   Message from Erma Heritage, [place] sent at 02/28/2018  9:08 AM EST ----- Please let the patient and his daughter know that his potassium was stable at 4.0. Kidney function and electrolytes within normal limits. If only taking Lasix 40mg  daily and with K+ stable, would inform him to take K-dur 67mEq BID. Would plan for a repeat BMET in 1 month.

## 2018-03-21 ENCOUNTER — Other Ambulatory Visit: Payer: Medicare PPO

## 2018-04-30 ENCOUNTER — Encounter: Payer: Self-pay | Admitting: Cardiovascular Disease

## 2018-04-30 ENCOUNTER — Ambulatory Visit: Payer: Medicare PPO | Admitting: Cardiovascular Disease

## 2018-04-30 VITALS — BP 114/64 | HR 67 | Ht 69.0 in | Wt 175.0 lb

## 2018-04-30 DIAGNOSIS — R600 Localized edema: Secondary | ICD-10-CM | POA: Diagnosis not present

## 2018-04-30 DIAGNOSIS — Z955 Presence of coronary angioplasty implant and graft: Secondary | ICD-10-CM | POA: Diagnosis not present

## 2018-04-30 DIAGNOSIS — I472 Ventricular tachycardia: Secondary | ICD-10-CM

## 2018-04-30 DIAGNOSIS — I4821 Permanent atrial fibrillation: Secondary | ICD-10-CM

## 2018-04-30 DIAGNOSIS — I482 Chronic atrial fibrillation: Secondary | ICD-10-CM

## 2018-04-30 DIAGNOSIS — I4729 Other ventricular tachycardia: Secondary | ICD-10-CM

## 2018-04-30 DIAGNOSIS — I25118 Atherosclerotic heart disease of native coronary artery with other forms of angina pectoris: Secondary | ICD-10-CM

## 2018-04-30 DIAGNOSIS — I428 Other cardiomyopathies: Secondary | ICD-10-CM | POA: Diagnosis not present

## 2018-04-30 NOTE — Progress Notes (Signed)
SUBJECTIVE: The patient presents for routine follow-up. Nuclear stress test on 02/05/2018 showed prior distal to anterior and apical myocardial infarction with no evidence of ischemia.  LVEF is calculated at 40%.  Echocardiogram on 12/14/2017 showed normal left ventricular systolic function and regional wall motion, LVEF 55 to 60%.  He has a history of coronary artery disease with remote angioplasty and stenting in 2002 in [place], [place]. He also has a history of atrial fibrillation and near-syncope. He is anticoagulated with Xarelto.  He has a history of bradycardia and falls and beta-blocker was stopped in August 2018.   He is here with his grandson, Wilfred Lacy, who will be attending [facility] [facility] in the fall.  The patient denies any symptoms of chest pain, palpitations, shortness of breath, lightheadedness, dizziness, leg swelling, orthopnea, PND, and syncope.  He has not had any additional falls.  He denies bleeding problems.  He previously told me that prior to undergoing angioplasty and stenting in 2002, he had been watching a [employer] basketball game and they were losing which made him upset.  He remembers experiencing dizziness and vomiting.     Review of Systems: As per "subjective", otherwise negative.  Allergies  Allergen Reactions  . Metoprolol     Prescribed for A. Fib 12/17-12/22/18 nocturnal heart rates in the high 30s and 40s, beta blocker discontinued    Current Outpatient Medications  Medication Sig Dispense Refill  . acetaminophen (TYLENOL) 325 MG tablet Take 650 mg by mouth every 6 (six) hours as needed.    Marland Kitchen aspirin EC 81 MG tablet Take 81 mg by mouth daily.    . cyanocobalamin 1000 MCG tablet Take 1,000 mcg by mouth daily.    . ferrous sulfate 325 (65 FE) MG tablet Take 1 tablet (325 mg total) by mouth 2 (two) times daily with a meal. 60 tablet 0  . folic acid (FOLVITE) 1 MG tablet Take 1 tablet (1 mg total) by mouth daily. 90 tablet 3  .  levothyroxine (SYNTHROID, LEVOTHROID) 75 MCG tablet Take 1 tablet (75 mcg total) by mouth daily before breakfast. 90 tablet 3  . Magnesium 200 MG TABS Take 400 mg by mouth daily.    . Melatonin 3 MG TABS Take 3 mg by mouth at bedtime.    . methotrexate 2.5 MG tablet Take 15 mg by mouth every Sunday. 6 tablets on Sunday    . NON FORMULARY Neilmed Sinus Rinse Complete ( sod chlor-bicarb-squeez bottle) packet with rinse device; amt: one packet via nasal; once a day    . polycarbophil (FIBERCON) 625 MG tablet Take 625 mg by mouth at bedtime.    . potassium chloride SA (K-DUR,KLOR-CON) 20 MEQ tablet Take 1 tablet (20 mEq total) by mouth 2 (two) times daily. 180 tablet 3  . Probiotic Product (RISA-BID PROBIOTIC PO) Take 1 tablet by mouth twice a day for 7 days from 12/24/2017-12/31/2017    . timolol (TIMOPTIC) 0.5 % ophthalmic solution INSTILL ONE DROP INTO EACH EYE TWICE DAILY    . triamcinolone (NASACORT ALLERGY 24HR) 55 MCG/ACT AERO nasal inhaler Place 1 spray into the nose daily.    . furosemide (LASIX) 40 MG tablet Take 1 tablet (40 mg total) by mouth daily. 30 tablet 0   No current facility-administered medications for this visit.     Past Medical History:  Diagnosis Date  . A-fib ([place])   . Arthritis    osteoarthritis  . Basal cell carcinoma (BCC) 08/29/2017  . Cataract   .  Frequent falls   . Glaucoma   . High cholesterol   . Hypertension   . MI (myocardial infarction) ([place])   . Rheumatoid arthritis ([place])   . Thyroid disease     Past Surgical History:  Procedure Laterality Date  . CORONARY ANGIOPLASTY WITH STENT PLACEMENT    . JOINT REPLACEMENT     left knee  . REPLACEMENT TOTAL KNEE Left     Social History   Socioeconomic History  . Marital status: Widowed    Spouse name: Not on file  . Number of children: 1  . Years of education: 17  . Highest education level: Not on file  Occupational History  . Occupation: retired    Comment: Psychologist, counselling and first aid equip  Social  Needs  . Financial resource strain: Not on file  . Food insecurity:    Worry: Not on file    Inability: Not on file  . Transportation needs:    Medical: Not on file    Non-medical: Not on file  Tobacco Use  . Smoking status: Former Smoker    Years: 15.00    Types: Cigarettes    Last attempt to quit: 12/26/1975    Years since quitting: 42.3  . Smokeless tobacco: Never Used  Substance and Sexual Activity  . Alcohol use: No  . Drug use: No  . Sexual activity: Not Currently  Lifestyle  . Physical activity:    Days per week: Not on file    Minutes per session: Not on file  . Stress: Not on file  Relationships  . Social connections:    Talks on phone: Not on file    Gets together: Not on file    Attends religious service: Not on file    Active member of club or organization: Not on file    Attends meetings of clubs or organizations: Not on file    Relationship status: Not on file  . Intimate partner violence:    Fear of current or ex partner: Not on file    Emotionally abused: Not on file    Physically abused: Not on file    Forced sexual activity: Not on file  Other Topics Concern  . Not on file  Social History Narrative   College graduate    Widow   Lives alone   Lives near [name]/daughter     Vitals:   04/30/18 1544  BP: 114/64  Pulse: 67  SpO2: 97%  Weight: 175 lb (79.4 kg)  Height: 5\' 9"  (1.753 m)    Wt Readings from Last 3 Encounters:  04/30/18 175 lb (79.4 kg)  02/07/18 169 lb (76.7 kg)  01/23/18 168 lb (76.2 kg)     PHYSICAL EXAM General: NAD HEENT: Normal. Neck: No JVD, no thyromegaly. Lungs: Clear to auscultation bilaterally with normal respiratory effort. CV: Regular rate and irregular rhythm, normal S1/S2, no S3, no murmur. No pretibial or periankle edema.     Abdomen: Soft, nontender, no distention.  Neurologic: Alert and oriented.  Psych: Normal affect. Skin: Normal. Musculoskeletal: No gross deformities.    ECG: Most recent ECG  reviewed.   Labs: Lab Results  Component Value Date/Time   K 4.0 02/28/2018 08:35 AM   BUN 13 02/28/2018 08:35 AM   CREATININE 0.80 02/28/2018 08:35 AM   CREATININE 0.82 08/02/2017 10:31 AM   ALT 10 04/30/2017 04:23 PM   TSH 2.791 12/14/2017 04:07 PM   TSH 2.30 08/02/2017 10:31 AM   HGB 11.3 (L) 12/25/2017 04:30  AM     Lipids: Lab Results  Component Value Date/Time   LDLCALC 48 04/30/2017 04:23 PM   CHOL 94 04/30/2017 04:23 PM   TRIG 73 04/30/2017 04:23 PM   HDL 31 (L) 04/30/2017 04:23 PM       ASSESSMENT AND PLAN: 1. CAD with history of AOZ:[id number] stable overall. Continue aspirin and simvastatin (he was taking simvastatin at his last visit but is not on his medication list today.  I will look into this).Not on beta blockers due to bradycardia and falls. However, given multiple runs of nonsustained ventricular tachycardia while hospitalized, I obtained a Lexiscan Myoview stress test to evaluate for any significant ischemic territories. There were none (reviewed above).  It did demonstrate left ventricular dysfunction, EF 40%.  He had normal LVEF by echocardiogram in 2018.  I will obtain a follow-up echocardiogram for LVEF reassessment. I will start aspirin 81 mg.  2. Permanent atrial fibrillation:Symptomatically stable with good heart rate control. No longer on beta blockers. He is not on systemic anticoagulation any longer due to frequent falls.  He is on aspirin 81 mg for coronary artery disease.  3. 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  Given left ventricular dysfunction seen with stress testing, I will obtain a follow-up echocardiogram for LVEF reassessment.  4.  Nonsustained ventricular tachycardia: Given multiple runs of nonsustained ventricular tachycardia while hospitalized, I obtained a Lexiscan Myoview stress test to evaluate for any significant ischemic territories. There were none (reviewed  above). Unable to tolerate beta-blockers.  5.  Cardiomyopathy: Nuclear stress test demonstrated left ventricular dysfunction, EF 40%. He had normal LVEF by echocardiogram in 2018.  I will obtain a follow-up echocardiogram for LVEF reassessment.     Disposition: Follow up 6 months   Kate Sable, M.D., F.A.C.C.

## 2018-04-30 NOTE — Patient Instructions (Addendum)
Your physician wants you to follow-up in: 6 months with Dr.Koneswaran You will receive a reminder letter in the mail two months in advance. If you don't receive a letter, please call our office to schedule the follow-up appointment.   Your physician has requested that you have a LIMITED echocardiogram. Echocardiography is a painless test that uses sound waves to create images of your heart. It provides your doctor with information about the size and shape of your heart and how well your heart's chambers and valves are working. This procedure takes approximately one hour. There are no restrictions for this procedure.    Re-start Zocor 40 mg at dinner    Your physician recommends that you continue on your current medications as directed. Please refer to the Current Medication list given to you today.    If you need a refill on your cardiac medications before your next appointment, please call your pharmacy.     No lab work today       Thank you for [place] !

## 2018-05-07 ENCOUNTER — Ambulatory Visit (HOSPITAL_COMMUNITY)
Admission: RE | Admit: 2018-05-07 | Discharge: 2018-05-07 | Disposition: A | Discharge status: Home / Self Care | Payer: Medicare PPO | Source: Ambulatory Visit | Attending: Cardiovascular Disease | Admitting: Cardiovascular Disease

## 2018-05-07 DIAGNOSIS — I119 Hypertensive heart disease without heart failure: Secondary | ICD-10-CM | POA: Diagnosis not present

## 2018-05-07 DIAGNOSIS — I4891 Unspecified atrial fibrillation: Secondary | ICD-10-CM | POA: Diagnosis not present

## 2018-05-07 DIAGNOSIS — I2510 Atherosclerotic heart disease of native coronary artery without angina pectoris: Secondary | ICD-10-CM | POA: Diagnosis not present

## 2018-05-07 DIAGNOSIS — E785 Hyperlipidemia, unspecified: Secondary | ICD-10-CM | POA: Diagnosis not present

## 2018-05-07 DIAGNOSIS — I428 Other cardiomyopathies: Secondary | ICD-10-CM

## 2018-05-07 MED ORDER — PERFLUTREN LIPID MICROSPHERE
1.0000 mL | INTRAVENOUS | Status: AC | PRN
  Administered 2018-05-07: 1 mL via INTRAVENOUS
  Administered 2018-05-07: 2 mL via INTRAVENOUS
  Administered 2018-05-07: 1 mL via INTRAVENOUS

## 2018-05-07 NOTE — Progress Notes (Signed)
*  PRELIMINARY RESULTS* Echocardiogram Limited 2-D Echocardiogram has been performed with Definity.  Samuel Germany 05/07/2018, 4:40 PM

## 2018-05-08 ENCOUNTER — Telehealth: Payer: Self-pay

## 2018-05-08 ENCOUNTER — Telehealth: Payer: Self-pay | Admitting: *Deleted

## 2018-05-08 NOTE — Telephone Encounter (Signed)
Called pt. No answer. Left message for pt to return call.  

## 2018-05-08 NOTE — Telephone Encounter (Signed)
-----   Message from Herminio Commons, MD sent at 05/07/2018  4:57 PM EDT ----- Pumping function is mildly reduced.  No changes to medications at this time.

## 2018-05-08 NOTE — Telephone Encounter (Signed)
Called patient with test results. No answer. Left message to call back.  

## 2018-05-10 ENCOUNTER — Ambulatory Visit: Payer: Medicare PPO | Admitting: Family Medicine

## 2018-05-15 ENCOUNTER — Encounter: Payer: Self-pay | Admitting: Family Medicine

## 2018-05-22 ENCOUNTER — Encounter (INDEPENDENT_AMBULATORY_CARE_PROVIDER_SITE_OTHER): Payer: Self-pay

## 2018-05-28 ENCOUNTER — Other Ambulatory Visit: Payer: Self-pay | Admitting: Family Medicine

## 2018-05-30 ENCOUNTER — Encounter: Payer: Self-pay | Admitting: Family Medicine

## 2018-05-31 ENCOUNTER — Encounter: Payer: Self-pay | Admitting: Family Medicine

## 2018-06-03 ENCOUNTER — Ambulatory Visit: Payer: Medicare PPO | Admitting: Neurology

## 2018-06-04 ENCOUNTER — Ambulatory Visit: Payer: Medicare PPO | Admitting: Family Medicine

## 2018-06-11 ENCOUNTER — Ambulatory Visit: Payer: Medicare PPO | Admitting: Neurology

## 2018-06-19 ENCOUNTER — Encounter: Payer: Self-pay | Admitting: Neurology

## 2018-06-19 ENCOUNTER — Ambulatory Visit: Payer: Medicare PPO | Admitting: Neurology

## 2018-06-19 VITALS — BP 125/67 | HR 54 | Ht 69.0 in | Wt 181.5 lb

## 2018-06-19 DIAGNOSIS — R2681 Unsteadiness on feet: Secondary | ICD-10-CM | POA: Diagnosis not present

## 2018-06-19 DIAGNOSIS — M05741 Rheumatoid arthritis with rheumatoid factor of right hand without organ or systems involvement: Secondary | ICD-10-CM

## 2018-06-19 DIAGNOSIS — R413 Other amnesia: Secondary | ICD-10-CM | POA: Diagnosis not present

## 2018-06-19 DIAGNOSIS — M05742 Rheumatoid arthritis with rheumatoid factor of left hand without organ or systems involvement: Secondary | ICD-10-CM

## 2018-06-19 MED ORDER — DONEPEZIL HCL 5 MG PO TABS: 5.0000 mg | ORAL_TABLET | Freq: Every day | ORAL | 0 refills | Status: DC

## 2018-06-19 MED ORDER — DONEPEZIL HCL 10 MG PO TABS: 10.0000 mg | ORAL_TABLET | Freq: Every day | ORAL | 11 refills | Status: AC

## 2018-06-19 NOTE — Progress Notes (Signed)
[facility]  PATIENT: [name] DOB: Dec 25, 1941  REFERRING DOCTOR OR PCP:  Dr. Meda Coffee  SOURCE:    Patient, notes from Dr. Meda Coffee, hospital admissions notes, neurology consult note, laboratory and imaging reports, MRI images on PACS.l,m _________________________________   HISTORICAL  CHIEF COMPLAINT:  Chief Complaint  Patient presents with  . Follow-up    Patient reports that he is doing well.     HISTORY OF PRESENT ILLNESS:  [name] is a 77 yo man with a gait disturbance.     Update 06/19/2018: He feels his walking is doing better.   He denies any falls.  His stride has increased in size.  He is not doing any more PT.    His daughter felt that the PT and Speech T were helpful.   He lasted physical therapy in the home a couple months ago.   He never got the MRI of the cervical spine as he prefers not to have another MRI.  He denies any new memory problems but his daughter notes that he is having more trouble with memory.   His daughter notes that he has become more forgetful though there is some selectivity with memory.  Late last year he had an MRI of the brain that showed atrophy pronounced in the mesial temporal lobes than elsewhere.  He has age-appropriate mild chronic microvascular ischemic changes.  He does not have depression or anxiety.  He snores some on his back but has never been noted to have gasping or snorting.  His rheumatoid arthritis is stable.  He takes methotrexate.  From 02/19/2018: He is a 77 year old man who was hospitalized in December after a fall.   In December, he was getting dressed and then was making his bed when he slumped backwards and fell.   There was npo LOC and no significant head trauma.   He had trouble getting up and was on the floor x 5-10 minutes.   He was able to crawl.   He pushed his Life Alert and they came to take him to the hospital.     He has had a number of falls over the past 5 years due to poor balance.    He has RA.    Balance and gait have progressively worsened.   He was using a cane at the time of the fall and after Rehab started using a walker.   He does better with the walker.    He is also doing some exercises since the Rehab stay.      He has also had mild memory and other cognitive issues, more pronounced over the last year or 2. His daughter notes that when his wife died 5 years ago for about 6-12 months he seems to be cognitively slowed but then improved. However, he has begun to worsen  He is more forgetful and makes more errors.   There is no current depression but there may have been one 5 years ago.  He has had incontinence on rare occasioan and has 2 x nocturia but not daytime urgency.      He is on Remicade and MTX for RA.    He snores but daughter has never noted any OSA signs.     Montreal Cognitive Assessment  01/30/2018  Visuospatial/ Executive (0/5) 3  Naming (0/3) 3  Attention: Read list of digits (0/2) 2  Attention: Read list of letters (0/1) 1  Attention: Serial 7 subtraction starting at 100 (0/3) 3  Language:  Repeat phrase (0/2) 2  Language : Fluency (0/1) 1  Abstraction (0/2) 2  Delayed Recall (0/5) 3  Orientation (0/6) 4  Total 24  Adjusted Score (based on education) 24     I personally reviewed the MRIs of the lumbar spine, thoracic spine and brain performed 12/11/2017. The MRI of the brain shows mild atrophy (a little more pronounced in the mesial temporal lobes)  and mild chronic microvascular ischemic changes but no acute findings. 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 The MRI of the lumbar spine read demonstrates the lower syrinx and also shows significant degenerative changes throughout. At L2-L3 there is severe loss of disc height and lateral recess stenosis on the left that could impinge the left L3 nerve root.   There is lateral recess stenosis and mild  foraminal narrowing at other levels with less potential for nerve root compression. Abnormal signal within the erector spina muscles and the psoas muscle noted most on the STIR images.  Also of note, the cervical spine did not appear to have any compression on the T1 scout images performed for the thoracic spine counting.   REVIEW OF SYSTEMS: Constitutional: No fevers, chills, sweats, or change in appetite Eyes: No visual changes, double vision, eye pain Ear, nose and throat: No hearing loss, ear pain, nasal congestion, sore throat Cardiovascular: No chest pain, palpitations Respiratory: No shortness of breath at rest or with exertion.   No wheezes.  He snores GastrointestinaI: No nausea, vomiting, diarrhea, abdominal pain, fecal incontinence Genitourinary: No dysuria, urinary retention or frequency.  No nocturia. Musculoskeletal: He reports joint pain and deformed joints in the hands and feet. He has RA. Integumentary: No rash, pruritus, skin lesions Neurological: as above Psychiatric: No depression at this time.  No anxiety Endocrine: No palpitations, diaphoresis, change in appetite, change in weigh or increased thirst Hematologic/Lymphatic: No anemia, purpura, petechiae. Allergic/Immunologic: No itchy/runny eyes, nasal congestion, recent allergic reactions, rashes  ALLERGIES: Allergies  Allergen Reactions  . Metoprolol     Prescribed for A. Fib 12/17-12/22/18 nocturnal heart rates in the high 30s and 40s, beta blocker discontinued    HOME MEDICATIONS:  Current Outpatient Medications:  .  acetaminophen (TYLENOL) 325 MG tablet, Take 650 mg by mouth every 6 (six) hours as needed., Disp: , Rfl:  .  aspirin EC 81 MG tablet, Take 81 mg by mouth daily., Disp: , Rfl:  .  cyanocobalamin 1000 MCG tablet, Take 1,000 mcg by mouth daily., Disp: , Rfl:  .  ferrous sulfate 325 (65 FE) MG tablet, Take 1 tablet (325 mg total) by mouth 2 (two) times daily with a meal., Disp: 60 tablet, Rfl:  0 .  folic acid (FOLVITE) 1 MG tablet, Take 1 tablet (1 mg total) by mouth daily., Disp: 90 tablet, Rfl: 3 .  levothyroxine (SYNTHROID, LEVOTHROID) 75 MCG tablet, TAKE 1 TABLET BY MOUTH ONCE DAILY BEFORE BREAKFAST, Disp: 90 tablet, Rfl: 0 .  Magnesium 200 MG TABS, Take 400 mg by mouth daily., Disp: , Rfl:  .  Melatonin 3 MG TABS, Take 3 mg by mouth at bedtime., Disp: , Rfl:  .  methotrexate 2.5 MG tablet, Take 15 mg by mouth every Sunday. 6 tablets on Sunday, Disp: , Rfl:  .  NON FORMULARY, Neilmed Sinus Rinse Complete ( sod chlor-bicarb-squeez bottle) packet with rinse device; amt: one packet via nasal; once a day, Disp: , Rfl:  .  polycarbophil (FIBERCON) 625 MG tablet, Take 625 mg by mouth at bedtime., Disp: , Rfl:  .  potassium chloride SA (K-DUR,KLOR-CON) 20 MEQ tablet, Take 1 tablet (20 mEq total) by mouth 2 (two) times daily., Disp: 180 tablet, Rfl: 3 .  Probiotic Product (RISA-BID PROBIOTIC PO), Take 1 tablet by mouth twice a day for 7 days from 12/24/2017-12/31/2017, Disp: , Rfl:  .  timolol (TIMOPTIC) 0.5 % ophthalmic solution, INSTILL ONE DROP INTO EACH EYE TWICE DAILY, Disp: , Rfl:  .  triamcinolone (NASACORT ALLERGY 24HR) 55 MCG/ACT AERO nasal inhaler, Place 1 spray into the nose daily., Disp: , Rfl:  .  donepezil (ARICEPT) 10 MG tablet, Take 1 tablet (10 mg total) by mouth at bedtime., Disp: 30 tablet, Rfl: 11 .  donepezil (ARICEPT) 5 MG tablet, Take 1 tablet (5 mg total) by mouth at bedtime., Disp: 30 tablet, Rfl: 0 .  furosemide (LASIX) 40 MG tablet, Take 1 tablet (40 mg total) by mouth daily., Disp: 30 tablet, Rfl: 0  PAST MEDICAL HISTORY: Past Medical History:  Diagnosis Date  . A-fib ([place])   . Arthritis    osteoarthritis  . Basal cell carcinoma (BCC) 08/29/2017  . Cataract   . Frequent falls   . Glaucoma   . High cholesterol   . Hypertension   . MI (myocardial infarction) ([place])   . Rheumatoid arthritis ([place])   . Thyroid disease     PAST SURGICAL HISTORY: Past Surgical  History:  Procedure Laterality Date  . CORONARY ANGIOPLASTY WITH STENT PLACEMENT    . JOINT REPLACEMENT     left knee  . REPLACEMENT TOTAL KNEE Left     FAMILY HISTORY: Family History  Problem Relation Age of Onset  . Heart attack Father 44  . Early death Father   . Alzheimer's disease Mother 55  . Early death Sister        MVA  . Hyperlipidemia Brother   . Heart disease Brother     SOCIAL HISTORY:  Social History   Socioeconomic History  . Marital status: Widowed    Spouse name: Not on file  . Number of children: 1  . Years of education: 31  . Highest education level: Not on file  Occupational History  . Occupation: retired    Comment: Psychologist, counselling and first aid equip  Social Needs  . Financial resource strain: Not on file  . Food insecurity:    Worry: Not on file    Inability: Not on file  . Transportation needs:    Medical: Not on file    Non-medical: Not on file  Tobacco Use  . Smoking status: Former Smoker    Years: 15.00    Types: Cigarettes    Last attempt to quit: 12/26/1975    Years since quitting: 42.5  . Smokeless tobacco: Never Used  Substance and Sexual Activity  . Alcohol use: No  . Drug use: No  . Sexual activity: Not Currently  Lifestyle  . Physical activity:    Days per week: Not on file    Minutes per session: Not on file  . Stress: Not on file  Relationships  . Social connections:    Talks on phone: Not on file    Gets together: Not on file    Attends religious service: Not on file    Active member of club or organization: Not on file    Attends meetings of clubs or organizations: Not on file    Relationship status: Not on  file  . Intimate partner violence:    Fear of current or ex partner: Not on file    Emotionally abused: Not on file    Physically abused: Not on file    Forced sexual activity: Not on file  Other Topics Concern  . Not on file  Social History Narrative   College graduate    Widow   Lives alone   Lives near  [place]:   06/19/18 1357  BP: 125/67  Pulse: (!) 54  Weight: 181 lb 8 oz (82.3 kg)  Height: 5\' 9"  (1.753 m)    Body mass index is 26.8 kg/m.   General: The patient is well-developed and well-nourished and in no acute distress   Neurologic Exam  Mental status: The patient is alert and oriented x 3 at the time of the examination. The patient has apparent normal recent and remote memory, with an apparently normal attention span and concentration ability.   Speech is normal.  Cranial nerves: Extraocular movements are full.  Facial strength and sensation is normal.  Trapezius strength is normal.. No dysarthria is noted.  The tongue is midline, and the patient has symmetric elevation of the soft palate. No obvious hearing deficits are noted.  Motor:  Muscle bulk is normal.   Tone is normal. Strength is  5 / 5 in all 4 extremities.   Sensory: He has intact sensation to touch and vibration in the arms.  He has normal vibration sensation at the ankles and about 25% reduced vibration sensation at the toes.  He has fairly normal sensation to pinprick throughout.  Coordination: Cerebellar testing reveals good finger-nose-finger and heel-to-shin bilaterally.  Gait and station: The station is normal.  He walks without support.  The stride is improved.  He is now able to turn 180 degrees and only 4 steps.  He has a wide tandem gait.  He has mild retropulsion when the station is disturbed. There is no Romberg sign..   Reflexes: Deep tendon reflexes are symmetric and normal bilaterally.       DIAGNOSTIC DATA (LABS, IMAGING, TESTING) - I reviewed patient records, labs, notes, testing and imaging myself where available.  Lab Results  Component Value Date   WBC 8.0 12/25/2017   HGB 11.3 (L) 12/25/2017   HCT 36.0 (L) 12/25/2017   MCV 100.0 12/25/2017   PLT 227 12/25/2017      Component Value Date/Time   NA 137 02/28/2018 0835   K 4.0 02/28/2018 0835    CL 100 (L) 02/28/2018 0835   CO2 28 02/28/2018 0835   GLUCOSE 125 (H) 02/28/2018 0835   BUN 13 02/28/2018 0835   CREATININE 0.80 02/28/2018 0835   CREATININE 0.82 08/02/2017 1031   CALCIUM 8.9 02/28/2018 0835   PROT 6.6 01/30/2018 1101   ALBUMIN 2.2 (L) 12/11/2017 1317   AST 17 04/30/2017 1623   ALT 10 04/30/2017 1623   ALKPHOS 52 04/30/2017 1623   BILITOT 0.5 04/30/2017 1623   GFRNONAA >60 02/28/2018 0835   GFRNONAA 89 04/30/2017 1623   GFRAA >60 02/28/2018 0835   GFRAA >89 04/30/2017 1623   Lab Results  Component Value Date   CHOL 94 04/30/2017   HDL 31 (L) 04/30/2017   LDLCALC 48 04/30/2017   TRIG 73 04/30/2017   CHOLHDL 3.0 04/30/2017   No results found for: HGBA1C Lab Results  Component Value Date   VITAMINB12 292 12/19/2017   Lab Results  Component Value Date  TSH 2.791 12/14/2017       ASSESSMENT AND PLAN  Unsteady gait  Rheumatoid arthritis involving both hands with positive rheumatoid factor (HCC)  Memory loss  1.   His gait is actually improved compared to the last visit.  Therefore, I will hold off on reordering the MRI of the cervical spine to evaluate for spinal stenosis. 2.    He is showing mild progressive cognitive difficulty.  He did not qualify for the research study but did go through a screening visit.  I discussed with him and his daughter that I would like him to stop the 5 mg and we will increase this dose if well tolerated. 3.   Return in 6 months or sooner if there are new or worsening neurologic symptoms.  40-minute face-to-face evaluation with greater than one half the time counseling and coordinating care mostly about his memory issues and discussing prognosis, etiology and treatments  Derrick Yoshino A. Felecia Shelling, MD, [facility] 04/26/8881, 8:00 PM Certified in Neurology, Clinical Neurophysiology, Sleep Medicine, Pain Medicine and Neuroimaging  [facility] [address] [place], [place] [address] [phone]

## 2018-06-26 ENCOUNTER — Ambulatory Visit: Payer: Medicare PPO | Admitting: Family Medicine

## 2018-07-11 ENCOUNTER — Other Ambulatory Visit: Payer: Self-pay | Admitting: Family Medicine

## 2018-07-19 ENCOUNTER — Other Ambulatory Visit: Payer: Self-pay | Admitting: Family Medicine

## 2018-07-30 ENCOUNTER — Other Ambulatory Visit: Payer: Self-pay | Admitting: Family Medicine

## 2018-08-05 ENCOUNTER — Inpatient Hospital Stay (HOSPITAL_COMMUNITY)
Admission: EM | Admit: 2018-08-05 | Discharge: 2018-08-08 | DRG: 470 | Disposition: A | Discharge status: Skilled Nursing Facility | Payer: Medicare PPO | Attending: Internal Medicine | Admitting: Internal Medicine

## 2018-08-05 ENCOUNTER — Emergency Department (HOSPITAL_COMMUNITY): Payer: Medicare PPO

## 2018-08-05 ENCOUNTER — Other Ambulatory Visit: Payer: Self-pay

## 2018-08-05 ENCOUNTER — Encounter (HOSPITAL_COMMUNITY): Payer: Self-pay | Admitting: Emergency Medicine

## 2018-08-05 DIAGNOSIS — M25552 Pain in left hip: Secondary | ICD-10-CM | POA: Diagnosis present

## 2018-08-05 DIAGNOSIS — Z87891 Personal history of nicotine dependence: Secondary | ICD-10-CM | POA: Diagnosis not present

## 2018-08-05 DIAGNOSIS — I10 Essential (primary) hypertension: Secondary | ICD-10-CM | POA: Diagnosis present

## 2018-08-05 DIAGNOSIS — W19XXXA Unspecified fall, initial encounter: Secondary | ICD-10-CM

## 2018-08-05 DIAGNOSIS — Z85828 Personal history of other malignant neoplasm of skin: Secondary | ICD-10-CM

## 2018-08-05 DIAGNOSIS — M62838 Other muscle spasm: Secondary | ICD-10-CM | POA: Diagnosis not present

## 2018-08-05 DIAGNOSIS — H409 Unspecified glaucoma: Secondary | ICD-10-CM | POA: Diagnosis present

## 2018-08-05 DIAGNOSIS — S72002A Fracture of unspecified part of neck of left femur, initial encounter for closed fracture: Secondary | ICD-10-CM | POA: Diagnosis present

## 2018-08-05 DIAGNOSIS — I4891 Unspecified atrial fibrillation: Secondary | ICD-10-CM | POA: Diagnosis not present

## 2018-08-05 DIAGNOSIS — E785 Hyperlipidemia, unspecified: Secondary | ICD-10-CM | POA: Diagnosis present

## 2018-08-05 DIAGNOSIS — D72829 Elevated white blood cell count, unspecified: Secondary | ICD-10-CM | POA: Diagnosis not present

## 2018-08-05 DIAGNOSIS — Y92003 Bedroom of unspecified non-institutional (private) residence as the place of occurrence of the external cause: Secondary | ICD-10-CM | POA: Diagnosis not present

## 2018-08-05 DIAGNOSIS — W06XXXA Fall from bed, initial encounter: Secondary | ICD-10-CM | POA: Diagnosis not present

## 2018-08-05 DIAGNOSIS — E7800 Pure hypercholesterolemia, unspecified: Secondary | ICD-10-CM | POA: Diagnosis present

## 2018-08-05 DIAGNOSIS — M069 Rheumatoid arthritis, unspecified: Secondary | ICD-10-CM | POA: Diagnosis present

## 2018-08-05 DIAGNOSIS — Z96652 Presence of left artificial knee joint: Secondary | ICD-10-CM | POA: Diagnosis present

## 2018-08-05 DIAGNOSIS — I482 Chronic atrial fibrillation, unspecified: Secondary | ICD-10-CM

## 2018-08-05 DIAGNOSIS — S72002D Fracture of unspecified part of neck of left femur, subsequent encounter for closed fracture with routine healing: Secondary | ICD-10-CM | POA: Diagnosis not present

## 2018-08-05 DIAGNOSIS — R296 Repeated falls: Secondary | ICD-10-CM | POA: Diagnosis present

## 2018-08-05 DIAGNOSIS — I252 Old myocardial infarction: Secondary | ICD-10-CM | POA: Diagnosis not present

## 2018-08-05 DIAGNOSIS — E039 Hypothyroidism, unspecified: Secondary | ICD-10-CM | POA: Diagnosis present

## 2018-08-05 DIAGNOSIS — R001 Bradycardia, unspecified: Secondary | ICD-10-CM | POA: Diagnosis present

## 2018-08-05 DIAGNOSIS — Z955 Presence of coronary angioplasty implant and graft: Secondary | ICD-10-CM

## 2018-08-05 DIAGNOSIS — Z7982 Long term (current) use of aspirin: Secondary | ICD-10-CM

## 2018-08-05 DIAGNOSIS — Z79890 Hormone replacement therapy (postmenopausal): Secondary | ICD-10-CM

## 2018-08-05 DIAGNOSIS — Z96649 Presence of unspecified artificial hip joint: Secondary | ICD-10-CM

## 2018-08-05 DIAGNOSIS — K5900 Constipation, unspecified: Secondary | ICD-10-CM | POA: Diagnosis not present

## 2018-08-05 DIAGNOSIS — F0390 Unspecified dementia without behavioral disturbance: Secondary | ICD-10-CM | POA: Diagnosis present

## 2018-08-05 LAB — CBC WITH DIFFERENTIAL/PLATELET
Basophils Absolute: 0 10*3/uL (ref 0.0–0.1)
Basophils Relative: 0 %
Eosinophils Absolute: 0.1 10*3/uL (ref 0.0–0.7)
Eosinophils Relative: 1 %
HCT: 41.2 % (ref 39.0–52.0)
HEMOGLOBIN: 13.8 g/dL (ref 13.0–17.0)
LYMPHS ABS: 1.8 10*3/uL (ref 0.7–4.0)
Lymphocytes Relative: 16 %
MCH: 34.7 pg — AB (ref 26.0–34.0)
MCHC: 33.5 g/dL (ref 30.0–36.0)
MCV: 103.5 fL — ABNORMAL HIGH (ref 78.0–100.0)
MONOS PCT: 10 %
Monocytes Absolute: 1.1 10*3/uL — ABNORMAL HIGH (ref 0.1–1.0)
NEUTROS ABS: 8 10*3/uL — AB (ref 1.7–7.7)
NEUTROS PCT: 73 %
Platelets: 231 10*3/uL (ref 150–400)
RBC: 3.98 MIL/uL — AB (ref 4.22–5.81)
RDW: 13.6 % (ref 11.5–15.5)
WBC: 11 10*3/uL — ABNORMAL HIGH (ref 4.0–10.5)

## 2018-08-05 LAB — BASIC METABOLIC PANEL
ANION GAP: 6 (ref 5–15)
BUN: 12 mg/dL (ref 8–23)
CHLORIDE: 103 mmol/L (ref 98–111)
CO2: 31 mmol/L (ref 22–32)
CREATININE: 0.88 mg/dL (ref 0.61–1.24)
Calcium: 9 mg/dL (ref 8.9–10.3)
GFR calc non Af Amer: >60 mL/min (ref >60)
Glucose, Bld: 102 mg/dL — ABNORMAL HIGH (ref 70–99)
Potassium: 4 mmol/L (ref 3.5–5.1)
Sodium: 140 mmol/L (ref 135–145)

## 2018-08-05 LAB — CBC
HCT: 39.1 % (ref 39.0–52.0)
HEMOGLOBIN: 12.9 g/dL — AB (ref 13.0–17.0)
MCH: 34 pg (ref 26.0–34.0)
MCHC: 33 g/dL (ref 30.0–36.0)
MCV: 103.2 fL — AB (ref 78.0–100.0)
Platelets: 226 10*3/uL (ref 150–400)
RBC: 3.79 MIL/uL — ABNORMAL LOW (ref 4.22–5.81)
RDW: 13.7 % (ref 11.5–15.5)
WBC: 11.3 10*3/uL — ABNORMAL HIGH (ref 4.0–10.5)

## 2018-08-05 LAB — TYPE AND SCREEN
ABO/RH(D): A POS
ABO/RH(D): A POS
ANTIBODY SCREEN: NEGATIVE
ANTIBODY SCREEN: NEGATIVE

## 2018-08-05 LAB — CREATININE, SERUM
Creatinine, Ser: 0.84 mg/dL (ref 0.61–1.24)
GFR calc Af Amer: >60 mL/min (ref >60)
GFR calc non Af Amer: >60 mL/min (ref >60)

## 2018-08-05 LAB — PROTIME-INR
INR: 1.14
Prothrombin Time: 14.5 seconds (ref 11.4–15.2)

## 2018-08-05 MED ORDER — FERROUS SULFATE 325 (65 FE) MG PO TABS
325.0000 mg | ORAL_TABLET | Freq: Two times a day (BID) | ORAL | Status: DC
  Administered 2018-08-05 – 2018-08-08 (×5): 325 mg via ORAL
  Filled 2018-08-05 (×5): qty 1

## 2018-08-05 MED ORDER — METHOCARBAMOL 1000 MG/10ML IJ SOLN
500.0000 mg | Freq: Four times a day (QID) | INTRAVENOUS | Status: DC | PRN
  Filled 2018-08-05: qty 5

## 2018-08-05 MED ORDER — METHOTREXATE 2.5 MG PO TABS: 15.0000 mg | ORAL_TABLET | ORAL | Status: DC

## 2018-08-05 MED ORDER — FOLIC ACID 1 MG PO TABS
1.0000 mg | ORAL_TABLET | Freq: Every day | ORAL | Status: DC
  Administered 2018-08-05 – 2018-08-08 (×4): 1 mg via ORAL
  Filled 2018-08-05 (×4): qty 1

## 2018-08-05 MED ORDER — VITAMIN B-12 1000 MCG PO TABS
1000.0000 ug | ORAL_TABLET | Freq: Every day | ORAL | Status: DC
  Administered 2018-08-05 – 2018-08-08 (×4): 1000 ug via ORAL
  Filled 2018-08-05 (×4): qty 1

## 2018-08-05 MED ORDER — FENTANYL CITRATE (PF) 100 MCG/2ML IJ SOLN
50.0000 ug | Freq: Once | INTRAMUSCULAR | Status: AC
  Administered 2018-08-05: 50 ug via INTRAVENOUS
  Filled 2018-08-05: qty 2

## 2018-08-05 MED ORDER — SIMVASTATIN 20 MG PO TABS
40.0000 mg | ORAL_TABLET | Freq: Every day | ORAL | Status: DC
  Administered 2018-08-05 – 2018-08-08 (×4): 40 mg via ORAL
  Filled 2018-08-05 (×4): qty 2

## 2018-08-05 MED ORDER — LEVOTHYROXINE SODIUM 75 MCG PO TABS
75.0000 ug | ORAL_TABLET | Freq: Every day | ORAL | Status: DC
  Administered 2018-08-06 – 2018-08-08 (×3): 75 ug via ORAL
  Filled 2018-08-05 (×3): qty 1

## 2018-08-05 MED ORDER — FUROSEMIDE 40 MG PO TABS
40.0000 mg | ORAL_TABLET | Freq: Every day | ORAL | Status: DC
  Administered 2018-08-05 – 2018-08-08 (×4): 40 mg via ORAL
  Filled 2018-08-05 (×4): qty 1

## 2018-08-05 MED ORDER — POTASSIUM CHLORIDE CRYS ER 20 MEQ PO TBCR
20.0000 meq | EXTENDED_RELEASE_TABLET | Freq: Two times a day (BID) | ORAL | Status: DC
  Administered 2018-08-05 – 2018-08-06 (×3): 20 meq via ORAL
  Filled 2018-08-05 (×4): qty 1

## 2018-08-05 MED ORDER — TIMOLOL MALEATE 0.5 % OP SOLN
1.0000 [drp] | Freq: Two times a day (BID) | OPHTHALMIC | Status: DC
  Administered 2018-08-05 – 2018-08-08 (×6): 1 [drp] via OPHTHALMIC
  Filled 2018-08-05: qty 5

## 2018-08-05 MED ORDER — METHOCARBAMOL 500 MG PO TABS
500.0000 mg | ORAL_TABLET | Freq: Four times a day (QID) | ORAL | Status: DC | PRN
  Administered 2018-08-06: 500 mg via ORAL
  Filled 2018-08-05: qty 1

## 2018-08-05 MED ORDER — MORPHINE SULFATE (PF) 2 MG/ML IV SOLN
0.5000 mg | INTRAVENOUS | Status: DC | PRN
  Administered 2018-08-05 – 2018-08-06 (×2): 0.5 mg via INTRAVENOUS
  Filled 2018-08-05 (×2): qty 1

## 2018-08-05 MED ORDER — HEPARIN SODIUM (PORCINE) 5000 UNIT/ML IJ SOLN
5000.0000 [IU] | Freq: Three times a day (TID) | INTRAMUSCULAR | Status: DC
  Administered 2018-08-05 – 2018-08-06 (×2): 5000 [IU] via SUBCUTANEOUS
  Filled 2018-08-05 (×2): qty 1

## 2018-08-05 MED ORDER — DONEPEZIL HCL 10 MG PO TABS
10.0000 mg | ORAL_TABLET | Freq: Every day | ORAL | Status: DC
  Administered 2018-08-05 – 2018-08-07 (×3): 10 mg via ORAL
  Filled 2018-08-05 (×3): qty 1

## 2018-08-05 MED ORDER — HYDROCODONE-ACETAMINOPHEN 5-325 MG PO TABS
1.0000 | ORAL_TABLET | Freq: Four times a day (QID) | ORAL | Status: DC | PRN
  Administered 2018-08-05 (×2): 1 via ORAL
  Filled 2018-08-05 (×2): qty 1

## 2018-08-05 NOTE — ED Notes (Signed)
Called [facility] for transport to [facility].

## 2018-08-05 NOTE — H&P (Signed)
History and Physical    Dravyn Severs BDZ:[id number] DOB: May 22, 1941 DOA: 08/05/2018  Referring MD/NP/PA: Davonna Belling, EDP PCP: Caren Macadam, MD  Patient coming from: Home  Chief Complaint: Fall with left hip pain  HPI: [name] is a 77 y.o. male with multiple medical comorbidities including atrial fibrillation not on anticoagulation due to history of frequent falls, rheumatoid arthritis, hypothyroidism, glaucoma, hypertension who presents to the hospital today after he fell off the side of his bed onto his left hip.  There is no family at bedside and patient has mild to moderate dementia and hence he is not the best historian.  He does tell me that he did not lose consciousness.  In the emergency department vital signs are stable with the exception of some bradycardia into the 50s, lab work is essentially unremarkable, chest x-ray shows no acute changes.  Left hip x-ray shows a mildly displaced and angulated left femoral neck fracture.  EDP as discussed with orthopedics on-call, Dr. Alvan Dame, who recommends transfer to [facility] for evaluation and surgical intervention tomorrow morning.  Past Medical/Surgical History: Past Medical History:  Diagnosis Date  . A-fib ([place])   . Arthritis    osteoarthritis  . Basal cell carcinoma (BCC) 08/29/2017  . Cataract   . Frequent falls   . Glaucoma   . High cholesterol   . Hypertension   . MI (myocardial infarction) ([place])   . Rheumatoid arthritis ([place])   . Thyroid disease     Past Surgical History:  Procedure Laterality Date  . CORONARY ANGIOPLASTY WITH STENT PLACEMENT    . JOINT REPLACEMENT     left knee  . REPLACEMENT TOTAL KNEE Left     Social History:  reports that he quit smoking about 42 years ago. His smoking use included cigarettes. He quit after 15.00 years of use. He has never used smokeless tobacco. He reports that he does not drink alcohol or use drugs.  Allergies: Allergies  Allergen Reactions  . Metoprolol    Prescribed for A. Fib 12/17-12/22/18 nocturnal heart rates in the high 30s and 40s, beta blocker discontinued    Family History:  Family History  Problem Relation Age of Onset  . Heart attack Father 16  . Early death Father   . Alzheimer's disease Mother 19  . Early death Sister        MVA  . Hyperlipidemia Brother   . Heart disease Brother     Prior to Admission medications   Medication Sig Start Date End Date Taking? Authorizing Provider  aspirin EC 81 MG tablet Take 81 mg by mouth daily.   Yes [provider]  cyanocobalamin 1000 MCG tablet Take 1,000 mcg by mouth daily.   Yes [provider]  donepezil (ARICEPT) 10 MG tablet Take 1 tablet (10 mg total) by mouth at bedtime. 06/19/18  Yes Sater, Nanine Means, MD  ferrous sulfate 325 (65 FE) MG tablet Take 1 tablet (325 mg total) by mouth 2 (two) times daily with a meal. 12/14/17  Yes Dhungel, Nishant, MD  folic acid (FOLVITE) 1 MG tablet TAKE 1 TABLET BY MOUTH ONCE DAILY 07/31/18  Yes Fayrene Helper, MD  furosemide (LASIX) 40 MG tablet Take 1 tablet (40 mg total) by mouth daily. 12/14/17 08/05/18 Yes Dhungel, Nishant, MD  levothyroxine (SYNTHROID, LEVOTHROID) 75 MCG tablet TAKE 1 TABLET BY MOUTH ONCE DAILY BEFORE BREAKFAST 05/28/18  Yes Hagler, Apolonio Schneiders, MD  Magnesium 200 MG TABS Take 400 mg by mouth daily.   Yes  [provider]  Melatonin 3 MG TABS Take 3 mg by mouth at bedtime.   Yes [provider]  NON FORMULARY Neilmed Sinus Rinse Complete ( sod chlor-bicarb-squeez bottle) packet with rinse device; amt: one packet via nasal; once a day   Yes [provider]  polycarbophil (FIBERCON) 625 MG tablet Take 625 mg by mouth at bedtime.   Yes [provider]  potassium chloride SA (K-DUR,KLOR-CON) 20 MEQ tablet Take 1 tablet (20 mEq total) by mouth 2 (two) times daily. 02/28/18  Yes Strader, [place], PA-C  simvastatin (ZOCOR) 40 MG tablet TAKE 1 TABLET BY MOUTH ONCE DAILY 07/16/18  Yes  Hagler, Apolonio Schneiders, MD  timolol (TIMOPTIC) 0.5 % ophthalmic solution INSTILL ONE DROP INTO EACH EYE TWICE DAILY 12/15/13  Yes [provider]  triamcinolone (NASACORT ALLERGY 24HR) 55 MCG/ACT AERO nasal inhaler Place 1 spray into the nose daily.   Yes [provider]  acetaminophen (TYLENOL) 325 MG tablet Take 650 mg by mouth every 6 (six) hours as needed.    [provider]  methotrexate 2.5 MG tablet Take 15 mg by mouth every Sunday. 6 tablets on Sunday    [provider]    Review of Systems:  Unable to obtain given his dementia.   Physical Exam: Vitals:   08/05/18 1230 08/05/18 1245 08/05/18 1300 08/05/18 1330  BP: (!) 144/57  136/66 (!) 142/62  Pulse:  (!) 53 63 (!) 54  Resp: 14 15 13 13   Temp:      TempSrc:      SpO2:  96% 96% 96%  Weight:      Height:          Constitutional: NAD, calm, in mild distress due to left hip pain Eyes: PERRL, lids and conjunctivae normal ENMT: Mucous membranes are moist. Posterior pharynx clear of any exudate or lesions.  poor dentition Neck: normal, supple,  Respiratory: clear to auscultation bilaterally, no wheezing, no crackles. Normal respiratory effort. No accessory muscle use.  Cardiovascular: Regular rate and rhythm, no murmurs / rubs / gallops. No extremity edema. 2+ pedal pulses. No carotid bruits.  Abdomen: no tenderness, no masses palpated. No hepatosplenomegaly. Bowel sounds positive.  Musculoskeletal: No cyanosis or edema, left leg is shortened and externally rotated  skin: no rashes, lesions, ulcers. No induration Neurologic: Unable to fully assess given current mental state Psychiatric: Unable to fully assess given current mental state   Labs on Admission: I have personally reviewed the following labs and imaging studies  CBC: Recent Labs  Lab 08/05/18 1009  WBC 11.0*  NEUTROABS 8.0*  HGB 13.8  HCT 41.2  MCV 103.5*  PLT 845   Basic Metabolic Panel: Recent Labs  Lab 08/05/18 1009    NA 140  K 4.0  CL 103  CO2 31  GLUCOSE 102*  BUN 12  CREATININE 0.88  CALCIUM 9.0   GFR: Estimated Creatinine Clearance: 73.7 mL/min (by C-G formula based on SCr of 0.88 mg/dL). Liver Function Tests: No results for input(s): AST, ALT, ALKPHOS, BILITOT, PROT, ALBUMIN in the last 168 hours. No results for input(s): LIPASE, AMYLASE in the last 168 hours. No results for input(s): AMMONIA in the last 168 hours. Coagulation Profile: Recent Labs  Lab 08/05/18 1009  INR 1.14   Cardiac Enzymes: No results for input(s): CKTOTAL, CKMB, CKMBINDEX, TROPONINI in the last 168 hours. BNP (last 3 results) No results for input(s): PROBNP in the last 8760 hours. HbA1C: No results for input(s): HGBA1C in the last  72 hours. CBG: No results for input(s): GLUCAP in the last 168 hours. Lipid Profile: No results for input(s): CHOL, HDL, LDLCALC, TRIG, CHOLHDL, LDLDIRECT in the last 72 hours. Thyroid Function Tests: No results for input(s): TSH, T4TOTAL, FREET4, T3FREE, THYROIDAB in the last 72 hours. Anemia Panel: No results for input(s): VITAMINB12, FOLATE, FERRITIN, TIBC, IRON, RETICCTPCT in the last 72 hours. Urine analysis:    Component Value Date/Time   COLORURINE AMBER (A) 12/10/2017 1638   APPEARANCEUR HAZY (A) 12/10/2017 1638   LABSPEC 1.019 12/10/2017 1638   PHURINE 5.0 12/10/2017 1638   GLUCOSEU NEGATIVE 12/10/2017 1638   HGBUR NEGATIVE 12/10/2017 [place] 12/10/2017 [place] 12/10/2017 1638   PROTEINUR NEGATIVE 12/10/2017 1638   NITRITE NEGATIVE 12/10/2017 1638   LEUKOCYTESUR NEGATIVE 12/10/2017 1638   Sepsis Labs: @LABRCNTIP (procalcitonin:4,lacticidven:4) )No results found for this or any previous visit (from the past 240 hour(s)).   Radiological Exams on Admission: Dg Chest 1 View  Result Date: 08/05/2018 CLINICAL DATA:  Atrial fibrillation EXAM: CHEST  1 VIEW COMPARISON:  December 10, 2017 FINDINGS: There is no edema or  consolidation. There is cardiomegaly with pulmonary vascularity normal. There is aortic atherosclerosis. There is deviation of the upper thoracic trachea to the left. There is degenerative change in the right shoulder with superior migration of the right humeral head. IMPRESSION: 1. Cardiomegaly. No edema or consolidation. Aortic atherosclerosis. 2. Shift of the upper thoracic trachea to the left. This finding was not present on prior study. Question localized thyroid enlargement in this area. 3. 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 Aortic Atherosclerosis (ICD10-I70.0). Electronically Signed   By: Lowella Grip III M.D.   On: 08/05/2018 10:04   Dg Hip Unilat W Or Wo Pelvis 2-3 Views Left  Result Date: 08/05/2018 CLINICAL DATA:  Status post fall, hip pain EXAM: DG HIP (WITH OR WITHOUT PELVIS) 2-3V LEFT COMPARISON:  None. FINDINGS: Generalized osteopenia. Mildly displaced and angulated left femoral neck fracture. No other fracture or dislocation. Peripheral vascular atherosclerotic disease. IMPRESSION: Mildly displaced and angulated left femoral neck fracture. Electronically Signed   By: Kathreen Devoid   On: 08/05/2018 10:05    EKG: Independently reviewed.  Atrial fibrillation at a rate of 45, no acute ischemic changes  Assessment/Plan Principal Problem:   Closed left hip fracture (HCC) Active Problems:   A-fib (HCC)   Essential hypertension   Hypothyroid   HLD (hyperlipidemia)    Closed left hip fracture -Following a mechanical fall. -To [facility] for operative repair in a.m. by Dr. Alvan Dame given lack of orthopedic coverage at [facility] today. -Cleared to proceed to surgery without delay.  Atrial fibrillation with bradycardia -He is not on rate controlling medications, specifically beta-blockers at home. -This is baseline for him.  Hypothyroidism -Check TSH, continue home dose of Synthroid.    Hyperlipidemia -Continue  statin.  Hypertension -Fair control, continue current management.  Rheumatoid arthritis -Continue methotrexate   DVT prophylaxis: Subcutaneous heparin Code Status: Full code Family Communication: Patient only Disposition Plan: To [facility] for repair of hip fracture Consults called: Orthopedic surgery Admission status: Admit - It is my clinical opinion that admission to INPATIENT is reasonable and necessary because of the expectation that this patient will require hospital care that crosses at least 2 midnights to treat this condition based on the medical complexity of the problems presented.  Given the aforementioned information, the predictability of an adverse  outcome is felt to be significant.      Time Spent: 85 minutes  Estela Isaac Bliss MD [facility] Pager [phone]  If 7PM-7AM, please contact night-coverage [email or web address] Password [email or web address]  08/05/2018, 2:40 PM

## 2018-08-05 NOTE — ED Notes (Signed)
Pt's hearing aids, glasses, and clothing given to daughter.

## 2018-08-05 NOTE — ED Notes (Signed)
Report given to [place] at [facility].  [facility] arrived for transport.

## 2018-08-05 NOTE — ED Provider Notes (Signed)
[facility] EMERGENCY DEPARTMENT Provider Note   CSN: [id number] Arrival date & time: 08/05/18  4854     History   Chief Complaint Chief Complaint  Patient presents with  . Fall  . Hip Pain    HPI [name] is a 77 y.o. male.  HPI Patient presents after a fall.  States he was sitting on the end of the bed and fell off injuring his left hip.  Has been able to get around with his walker but not really "walk".  Pain is only in the hip.  No head or neck pain.  No loss consciousness.  Has had previous left knee replacement. Past Medical History:  Diagnosis Date  . A-fib ([place])   . Arthritis    osteoarthritis  . Basal cell carcinoma (BCC) 08/29/2017  . Cataract   . Frequent falls   . Glaucoma   . High cholesterol   . Hypertension   . MI (myocardial infarction) ([place])   . Rheumatoid arthritis ([place])   . Thyroid disease     Patient Active Problem List   Diagnosis Date Noted  . Memory loss 06/19/2018  . Sleep disorder 12/19/2017  . 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 Sinus bradycardia 12/14/2017  . NSVT (nonsustained ventricular tachycardia) ([place]) 12/14/2017  . Ataxia 12/10/2017  . Unsteady gait 12/10/2017  . Cellulitis of right lower extremity 12/10/2017  . Basal cell carcinoma (BCC) 08/29/2017  . Actinic keratosis 08/20/2017  . Essential hypertension 04/30/2017  . Hypothyroid 04/30/2017  . HLD (hyperlipidemia) 04/30/2017  . CAD in native artery 04/30/2017  . 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 Pedal edema 04/30/2017  . Glaucoma 04/30/2017  . Abdominal aortic atherosclerosis ([place]) 04/30/2017  . 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 Asymptomatic gallstones 04/30/2017  . Benign prostatic hyperplasia 04/30/2017  . Diverticulosis 04/30/2017  . Esophagitis 04/30/2017  . Angular cheilitis 04/30/2017  . A-fib ([place]) 04/11/2017  . Rheumatoid arthritis ([place]) 04/11/2017    Past Surgical History:  Procedure Laterality Date  .  CORONARY ANGIOPLASTY WITH STENT PLACEMENT    . JOINT REPLACEMENT     left knee  . REPLACEMENT TOTAL KNEE Left         Home Medications    Prior to Admission medications   Medication Sig Start Date End Date Taking? Authorizing Provider  aspirin EC 81 MG tablet Take 81 mg by mouth daily.   Yes [provider]  cyanocobalamin 1000 MCG tablet Take 1,000 mcg by mouth daily.   Yes [provider]  donepezil (ARICEPT) 10 MG tablet Take 1 tablet (10 mg total) by mouth at bedtime. 06/19/18  Yes Sater, Nanine Means, MD  ferrous sulfate 325 (65 FE) MG tablet Take 1 tablet (325 mg total) by mouth 2 (two) times daily with a meal. 12/14/17  Yes Dhungel, Nishant, MD  folic acid (FOLVITE) 1 MG tablet TAKE 1 TABLET BY MOUTH ONCE DAILY 07/31/18  Yes Fayrene Helper, MD  furosemide (LASIX) 40 MG tablet Take 1 tablet (40 mg total) by mouth daily. 12/14/17 08/05/18 Yes Dhungel, Nishant, MD  levothyroxine (SYNTHROID, LEVOTHROID) 75 MCG tablet TAKE 1 TABLET BY MOUTH ONCE DAILY BEFORE BREAKFAST 05/28/18  Yes Hagler, Apolonio Schneiders, MD  Magnesium 200 MG TABS Take 400 mg by mouth daily.   Yes [provider]  Melatonin 3 MG TABS Take 3 mg by mouth at bedtime.   Yes [provider]  NON FORMULARY Neilmed Sinus Rinse Complete ( sod chlor-bicarb-squeez bottle)  packet with rinse device; amt: one packet via nasal; once a day   Yes [provider]  polycarbophil (FIBERCON) 625 MG tablet Take 625 mg by mouth at bedtime.   Yes [provider]  potassium chloride SA (K-DUR,KLOR-CON) 20 MEQ tablet Take 1 tablet (20 mEq total) by mouth 2 (two) times daily. 02/28/18  Yes Strader, [place], PA-C  simvastatin (ZOCOR) 40 MG tablet TAKE 1 TABLET BY MOUTH ONCE DAILY 07/16/18  Yes Hagler, Apolonio Schneiders, MD  timolol (TIMOPTIC) 0.5 % ophthalmic solution INSTILL ONE DROP INTO EACH EYE TWICE DAILY 12/15/13  Yes [provider]  triamcinolone (NASACORT ALLERGY 24HR) 55 MCG/ACT AERO nasal inhaler  Place 1 spray into the nose daily.   Yes [provider]  acetaminophen (TYLENOL) 325 MG tablet Take 650 mg by mouth every 6 (six) hours as needed.    [provider]  methotrexate 2.5 MG tablet Take 15 mg by mouth every Sunday. 6 tablets on Sunday    [provider]    Family History Family History  Problem Relation Age of Onset  . Heart attack Father 68  . Early death Father   . Alzheimer's disease Mother 43  . Early death Sister        MVA  . Hyperlipidemia Brother   . Heart disease Brother     Social History Social History   Tobacco Use  . Smoking status: Former Smoker    Years: 15.00    Types: Cigarettes    Last attempt to quit: 12/26/1975    Years since quitting: 42.6  . Smokeless tobacco: Never Used  Substance Use Topics  . Alcohol use: No  . Drug use: No     Allergies   Metoprolol   Review of Systems Review of Systems  Constitutional: Negative for appetite change.  Respiratory: Negative for stridor.   Gastrointestinal: Negative for abdominal pain.  Genitourinary: Negative for flank pain.  Musculoskeletal:       Left hip pain.  Neurological: Positive for numbness. Negative for weakness.  Hematological: Negative for adenopathy.  Psychiatric/Behavioral: Negative for confusion.     Physical Exam Updated Vital Signs BP 122/67   Pulse (!) 39   Temp 98.1 F (36.7 C) (Oral)   Resp 13   Ht 5\' 10"  (1.778 m)   Wt 79.4 kg   SpO2 97%   BMI 25.11 kg/m   Physical Exam  Constitutional: He appears well-developed and well-nourished.  HENT:  Head: Atraumatic.  Neck: Neck supple.  Cardiovascular: Normal rate.  Pulmonary/Chest: Effort normal.  Abdominal: There is no tenderness.  Musculoskeletal: He exhibits tenderness.  Tenderness over left hip with decreased range of motion.  No other pelvic tenderness.  No knee tenderness.  No ankle tenderness.  Decreased sensation over left foot with some edema.  Chronic venous changes.    Neurological: He is alert.  Skin: Skin is warm. Capillary refill takes less than 2 seconds.     ED Treatments / Results  Labs (all labs ordered are listed, but only abnormal results are displayed) Labs Reviewed  BASIC METABOLIC PANEL - Abnormal; Notable for the following components:      Result Value   Glucose, Bld 102 (*)    All other components within normal limits  CBC WITH DIFFERENTIAL/PLATELET - Abnormal; Notable for the following components:   WBC 11.0 (*)    RBC 3.98 (*)    MCV 103.5 (*)    MCH 34.7 (*)    Neutro Abs 8.0 (*)  Monocytes Absolute 1.1 (*)    All other components within normal limits  PROTIME-INR  TYPE AND SCREEN    EKG EKG Interpretation  Date/Time:  Monday August 05 2018 10:05:04 EDT Ventricular Rate:  45 PR Interval:    QRS Duration: 107 QT Interval:  460 QTC Calculation: 398 R Axis:   -87 Text Interpretation:  Atrial fibrillation Left anterior fascicular block Nonspecific T abnrm, anterolateral leads Confirmed by Davonna Belling [phone]) on 08/05/2018 11:23:33 AM   Radiology Dg Chest 1 View  Result Date: 08/05/2018 CLINICAL DATA:  Atrial fibrillation EXAM: CHEST  1 VIEW COMPARISON:  December 10, 2017 FINDINGS: There is no edema or consolidation. There is cardiomegaly with pulmonary vascularity normal. There is aortic atherosclerosis. There is deviation of the upper thoracic trachea to the left. There is degenerative change in the right shoulder with superior migration of the right humeral head. IMPRESSION: 1. Cardiomegaly. No edema or consolidation. Aortic atherosclerosis. 2. Shift of the upper thoracic trachea to the left. This finding was not present on prior study. Question localized thyroid enlargement in this area. 3. 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 Aortic Atherosclerosis (ICD10-I70.0). Electronically Signed   By: Lowella Grip III M.D.   On: 08/05/2018 10:04   Dg Hip  Unilat W Or Wo Pelvis 2-3 Views Left  Result Date: 08/05/2018 CLINICAL DATA:  Status post fall, hip pain EXAM: DG HIP (WITH OR WITHOUT PELVIS) 2-3V LEFT COMPARISON:  None. FINDINGS: Generalized osteopenia. Mildly displaced and angulated left femoral neck fracture. No other fracture or dislocation. Peripheral vascular atherosclerotic disease. IMPRESSION: Mildly displaced and angulated left femoral neck fracture. Electronically Signed   By: Kathreen Devoid   On: 08/05/2018 10:05    Procedures Procedures (including critical care time)  Medications Ordered in ED Medications - No data to display   Initial Impression / Assessment and Plan / ED Course  I have reviewed the triage vital signs and the nursing notes.  Pertinent labs & imaging results that were available during my care of the patient were reviewed by me and considered in my medical decision making (see chart for details).     Patient with mechanical fall.  Slipped getting off the bed.  Has left femoral neck fracture.  Discussed with Dr. Alvan Dame, will likely operate tomorrow.  Would like patient transferred to [facility] long.  Will admit to hospitalist.  Has a history of chronic A. fib.  Not on anticoagulation due to previous falls.  Final Clinical Impressions(s) / ED Diagnoses   Final diagnoses:  Fall, initial encounter  Closed fracture of neck of left femur, initial encounter [facility])  Chronic atrial fibrillation [facility])    ED Discharge Orders    None       Davonna Belling, MD 08/05/18 1125

## 2018-08-05 NOTE — ED Triage Notes (Signed)
Pt reports sitting on the edge of his bed and sliding down to floor this morning.  C/o left hip pain.  Was able to walk, but was very difficult.

## 2018-08-05 NOTE — Consult Note (Signed)
Reason for Consult: Left hip fracture Referring Physician: Jerilee Hoh, MD (Hospitalist)  [name] is an 77 y.o. male.  HPI: [name] is a 77 y.o. male with multiple medical comorbidities including atrial fibrillation not on anticoagulation due to history of frequent falls, rheumatoid arthritis, hypothyroidism, glaucoma, hypertension who presents to the hospital today after he fell off the side of his bed onto his left hip.  There is no family at bedside and patient has mild to moderate dementia (recenttly diagnosed by Neurology) and hence he is not the best historian.  He does tell me that he did not lose consciousness.  Does have a hard time hearing as well.  In the emergency department vital signs are stable with the exception of some bradycardia into the 50s, lab work is essentially unremarkable, chest x-ray shows no acute changes.  Left hip x-ray shows a mildly displaced and angulated left femoral neck fracture.   Transferred from [facility] due to coverage issues for definitive management of his hip fracture   Past Medical History:  Diagnosis Date  . A-fib ([place])   . Arthritis    osteoarthritis  . Basal cell carcinoma (BCC) 08/29/2017  . Cataract   . Frequent falls   . Glaucoma   . High cholesterol   . Hypertension   . MI (myocardial infarction) ([place])   . Rheumatoid arthritis ([place])   . Thyroid disease     Past Surgical History:  Procedure Laterality Date  . CORONARY ANGIOPLASTY WITH STENT PLACEMENT    . JOINT REPLACEMENT     left knee  . REPLACEMENT TOTAL KNEE Left     Family History  Problem Relation Age of Onset  . Heart attack Father 58  . Early death Father   . Alzheimer's disease Mother 49  . Early death Sister        MVA  . Hyperlipidemia Brother   . Heart disease Brother     Social History:  reports that he quit smoking about 42 years ago. His smoking use included cigarettes. He quit after 15.00 years of use. He has never used smokeless tobacco. He  reports that he does not drink alcohol or use drugs.  Allergies:  Allergies  Allergen Reactions  . Metoprolol     Prescribed for A. Fib 12/17-12/22/18 nocturnal heart rates in the high 30s and 40s, beta blocker discontinued    Medications:  I have reviewed the patient's current medications. Scheduled: . donepezil  10 mg Oral QHS  . ferrous sulfate  325 mg Oral BID WC  . folic acid  1 mg Oral Daily  . furosemide  40 mg Oral Daily  . heparin  5,000 Units Subcutaneous Q8H  . [START ON 08/06/2018] levothyroxine  75 mcg Oral QAC breakfast  . [START ON 08/11/2018] methotrexate  15 mg Oral Q Sun  . potassium chloride SA  20 mEq Oral BID  . simvastatin  40 mg Oral Daily  . timolol  1 drop Both Eyes BID  . cyanocobalamin  1,000 mcg Oral Daily    Results for orders placed or performed during the hospital encounter of 08/05/18 (from the past 24 hour(s))  Type and screen [facility]     Status: None   Collection Time: 08/05/18  9:51 AM  Result Value Ref Range   ABO/RH(D) A POS    Antibody Screen NEG    Sample Expiration      08/08/2018 Performed at [facility], [address], [place],  [address]  Basic metabolic panel     Status: Abnormal   Collection Time: 08/05/18 10:09 AM  Result Value Ref Range   Sodium 140 135 - 145 mmol/L   Potassium 4.0 3.5 - 5.1 mmol/L   Chloride 103 98 - 111 mmol/L   CO2 31 22 - 32 mmol/L   Glucose, Bld 102 (H) 70 - 99 mg/dL   BUN 12 8 - 23 mg/dL   Creatinine, Ser 0.88 0.61 - 1.24 mg/dL   Calcium 9.0 8.9 - 10.3 mg/dL   GFR calc non Af Amer >60 >60 mL/min   GFR calc Af Amer >60 >60 mL/min   Anion gap 6 5 - 15  CBC WITH DIFFERENTIAL     Status: Abnormal   Collection Time: 08/05/18 10:09 AM  Result Value Ref Range   WBC 11.0 (H) 4.0 - 10.5 K/uL   RBC 3.98 (L) 4.22 - 5.81 MIL/uL   Hemoglobin 13.8 13.0 - 17.0 g/dL   HCT 41.2 39.0 - 52.0 %   MCV 103.5 (H) 78.0 - 100.0 fL   MCH 34.7 (H) 26.0 - 34.0 pg   MCHC 33.5 30.0 - 36.0 g/dL   RDW  13.6 11.5 - 15.5 %   Platelets 231 150 - 400 K/uL   Neutrophils Relative % 73 %   Neutro Abs 8.0 (H) 1.7 - 7.7 K/uL   Lymphocytes Relative 16 %   Lymphs Abs 1.8 0.7 - 4.0 K/uL   Monocytes Relative 10 %   Monocytes Absolute 1.1 (H) 0.1 - 1.0 K/uL   Eosinophils Relative 1 %   Eosinophils Absolute 0.1 0.0 - 0.7 K/uL   Basophils Relative 0 %   Basophils Absolute 0.0 0.0 - 0.1 K/uL  Protime-INR     Status: None   Collection Time: 08/05/18 10:09 AM  Result Value Ref Range   Prothrombin Time 14.5 11.4 - 15.2 seconds   INR 1.14   CBC     Status: Abnormal   Collection Time: 08/05/18  3:43 PM  Result Value Ref Range   WBC 11.3 (H) 4.0 - 10.5 K/uL   RBC 3.79 (L) 4.22 - 5.81 MIL/uL   Hemoglobin 12.9 (L) 13.0 - 17.0 g/dL   HCT 39.1 39.0 - 52.0 %   MCV 103.2 (H) 78.0 - 100.0 fL   MCH 34.0 26.0 - 34.0 pg   MCHC 33.0 30.0 - 36.0 g/dL   RDW 13.7 11.5 - 15.5 %   Platelets 226 150 - 400 K/uL  Creatinine, serum     Status: None   Collection Time: 08/05/18  3:43 PM  Result Value Ref Range   Creatinine, Ser 0.84 0.61 - 1.24 mg/dL   GFR calc non Af Amer >60 >60 mL/min   GFR calc Af Amer >60 >60 mL/min    X-ray:  Displaced left femoral neck fracture  CLINICAL DATA:  Status post fall, hip pain  EXAM: DG HIP (WITH OR WITHOUT PELVIS) 2-3V LEFT  COMPARISON:  None.  FINDINGS: Generalized osteopenia. Mildly displaced and angulated left femoral neck fracture. No other fracture or dislocation. Peripheral vascular atherosclerotic disease.  IMPRESSION: Mildly displaced and angulated left femoral neck fracture.   Electronically Signed   By: Kathreen Devoid  ROS  As per HPI   Blood pressure (!) 122/58, pulse 68, temperature 98.7 F (37.1 C), temperature source Oral, resp. rate 15, height 5\' 10"  (1.778 m), weight 79.4 kg, SpO2 100 %.  Physical Exam  Awake alert No UE abnormality LLE slight shortened ER, NVI General medical exam  per admission  Assessment/Plan: Displaced left  femoral neck fracture  Plan: Needs hemiarthroplasty Plan for OR tomorrow Reviewed plans with his daughter NPO DVT prophylaxis post op Post op disposition pending PT assessment   Mauri Pole 08/05/2018, 6:35 PM

## 2018-08-06 ENCOUNTER — Inpatient Hospital Stay (HOSPITAL_COMMUNITY): Payer: Medicare PPO | Admitting: Anesthesiology

## 2018-08-06 ENCOUNTER — Inpatient Hospital Stay (HOSPITAL_COMMUNITY): Payer: Medicare PPO

## 2018-08-06 ENCOUNTER — Encounter (HOSPITAL_COMMUNITY)
Admission: EM | Disposition: A | Discharge status: Skilled Nursing Facility | Payer: Self-pay | Source: Home / Self Care | Attending: Internal Medicine

## 2018-08-06 DIAGNOSIS — I482 Chronic atrial fibrillation: Secondary | ICD-10-CM

## 2018-08-06 DIAGNOSIS — I10 Essential (primary) hypertension: Secondary | ICD-10-CM

## 2018-08-06 DIAGNOSIS — S72002A Fracture of unspecified part of neck of left femur, initial encounter for closed fracture: Principal | ICD-10-CM

## 2018-08-06 HISTORY — PX: HIP ARTHROPLASTY: SHX981

## 2018-08-06 LAB — BASIC METABOLIC PANEL
ANION GAP: 8 (ref 5–15)
BUN: 12 mg/dL (ref 8–23)
CALCIUM: 8.6 mg/dL — AB (ref 8.9–10.3)
CO2: 28 mmol/L (ref 22–32)
Chloride: 106 mmol/L (ref 98–111)
Creatinine, Ser: 0.75 mg/dL (ref 0.61–1.24)
GFR calc Af Amer: >60 mL/min (ref >60)
GFR calc non Af Amer: >60 mL/min (ref >60)
Glucose, Bld: 128 mg/dL — ABNORMAL HIGH (ref 70–99)
Potassium: 3.9 mmol/L (ref 3.5–5.1)
SODIUM: 142 mmol/L (ref 135–145)

## 2018-08-06 LAB — CBC
HCT: 38.6 % — ABNORMAL LOW (ref 39.0–52.0)
Hemoglobin: 12.7 g/dL — ABNORMAL LOW (ref 13.0–17.0)
MCH: 33.7 pg (ref 26.0–34.0)
MCHC: 32.9 g/dL (ref 30.0–36.0)
MCV: 102.4 fL — ABNORMAL HIGH (ref 78.0–100.0)
PLATELETS: 239 10*3/uL (ref 150–400)
RBC: 3.77 MIL/uL — ABNORMAL LOW (ref 4.22–5.81)
RDW: 13.9 % (ref 11.5–15.5)
WBC: 9.6 10*3/uL (ref 4.0–10.5)

## 2018-08-06 LAB — ABO/RH: ABO/RH(D): A POS

## 2018-08-06 SURGERY — HEMIARTHROPLASTY, HIP, DIRECT ANTERIOR APPROACH, FOR FRACTURE
Anesthesia: General | Site: Hip | Laterality: Left

## 2018-08-06 MED ORDER — ASPIRIN EC 325 MG PO TBEC
325.0000 mg | DELAYED_RELEASE_TABLET | Freq: Two times a day (BID) | ORAL | Status: DC
  Administered 2018-08-07 – 2018-08-08 (×3): 325 mg via ORAL
  Filled 2018-08-06 (×3): qty 1

## 2018-08-06 MED ORDER — METOCLOPRAMIDE HCL 5 MG/ML IJ SOLN: 5.0000 mg | Freq: Three times a day (TID) | INTRAMUSCULAR | Status: DC | PRN

## 2018-08-06 MED ORDER — PROPOFOL 10 MG/ML IV BOLUS
INTRAVENOUS | Status: AC
  Filled 2018-08-06: qty 20

## 2018-08-06 MED ORDER — DEXAMETHASONE SODIUM PHOSPHATE 10 MG/ML IJ SOLN: 8.0000 mg | Freq: Once | INTRAMUSCULAR | Status: DC

## 2018-08-06 MED ORDER — DEXAMETHASONE SODIUM PHOSPHATE 10 MG/ML IJ SOLN
INTRAMUSCULAR | Status: DC | PRN
  Administered 2018-08-06: 10 mg via INTRAVENOUS

## 2018-08-06 MED ORDER — LIDOCAINE 2% (20 MG/ML) 5 ML SYRINGE
INTRAMUSCULAR | Status: AC
  Filled 2018-08-06: qty 5

## 2018-08-06 MED ORDER — SODIUM CHLORIDE 0.9 % IV SOLN
1000.0000 mg | INTRAVENOUS | Status: AC
  Administered 2018-08-06: 1000 mg via INTRAVENOUS
  Filled 2018-08-06: qty 10

## 2018-08-06 MED ORDER — GLYCOPYRROLATE PF 0.2 MG/ML IJ SOSY
PREFILLED_SYRINGE | INTRAMUSCULAR | Status: DC | PRN
  Administered 2018-08-06: 0.4 mg via INTRAVENOUS

## 2018-08-06 MED ORDER — DOCUSATE SODIUM 100 MG PO CAPS: 100.0000 mg | ORAL_CAPSULE | Freq: Two times a day (BID) | ORAL | 0 refills | Status: DC

## 2018-08-06 MED ORDER — FENTANYL CITRATE (PF) 100 MCG/2ML IJ SOLN
INTRAMUSCULAR | Status: DC | PRN
  Administered 2018-08-06 (×2): 25 ug via INTRAVENOUS

## 2018-08-06 MED ORDER — FENTANYL CITRATE (PF) 100 MCG/2ML IJ SOLN
INTRAMUSCULAR | Status: AC
  Filled 2018-08-06: qty 2

## 2018-08-06 MED ORDER — ASPIRIN 81 MG PO CHEW: 81.0000 mg | CHEWABLE_TABLET | Freq: Two times a day (BID) | ORAL | 0 refills | Status: AC

## 2018-08-06 MED ORDER — LACTATED RINGERS IV SOLN
INTRAVENOUS | Status: DC
  Administered 2018-08-06: 14:00:00 via INTRAVENOUS

## 2018-08-06 MED ORDER — ONDANSETRON HCL 4 MG PO TABS: 4.0000 mg | ORAL_TABLET | Freq: Four times a day (QID) | ORAL | Status: DC | PRN

## 2018-08-06 MED ORDER — ONDANSETRON HCL 4 MG/2ML IJ SOLN: 4.0000 mg | Freq: Once | INTRAMUSCULAR | Status: DC | PRN

## 2018-08-06 MED ORDER — HYDROMORPHONE HCL 1 MG/ML IJ SOLN
INTRAMUSCULAR | Status: AC
  Administered 2018-08-06: 0.25 mg via INTRAVENOUS
  Filled 2018-08-06: qty 1

## 2018-08-06 MED ORDER — MENTHOL 3 MG MT LOZG: 1.0000 | LOZENGE | OROMUCOSAL | Status: DC | PRN

## 2018-08-06 MED ORDER — LIDOCAINE 2% (20 MG/ML) 5 ML SYRINGE
INTRAMUSCULAR | Status: DC | PRN
  Administered 2018-08-06: 100 mg via INTRAVENOUS

## 2018-08-06 MED ORDER — DOCUSATE SODIUM 100 MG PO CAPS
100.0000 mg | ORAL_CAPSULE | Freq: Two times a day (BID) | ORAL | Status: DC
  Administered 2018-08-06 – 2018-08-08 (×3): 100 mg via ORAL
  Filled 2018-08-06 (×4): qty 1

## 2018-08-06 MED ORDER — POLYETHYLENE GLYCOL 3350 17 G PO PACK: 17.0000 g | PACK | Freq: Two times a day (BID) | ORAL | 0 refills | Status: AC

## 2018-08-06 MED ORDER — ONDANSETRON HCL 4 MG/2ML IJ SOLN
INTRAMUSCULAR | Status: DC | PRN
  Administered 2018-08-06: 4 mg via INTRAVENOUS

## 2018-08-06 MED ORDER — CEFAZOLIN SODIUM-DEXTROSE 2-4 GM/100ML-% IV SOLN
2.0000 g | INTRAVENOUS | Status: AC
  Administered 2018-08-06: 2 g via INTRAVENOUS
  Filled 2018-08-06: qty 100

## 2018-08-06 MED ORDER — PHENOL 1.4 % MT LIQD
1.0000 | OROMUCOSAL | Status: DC | PRN
  Filled 2018-08-06: qty 177

## 2018-08-06 MED ORDER — GLYCOPYRROLATE PF 0.2 MG/ML IJ SOSY
PREFILLED_SYRINGE | INTRAMUSCULAR | Status: AC
  Filled 2018-08-06: qty 1

## 2018-08-06 MED ORDER — MEPERIDINE HCL 50 MG/ML IJ SOLN: 6.2500 mg | INTRAMUSCULAR | Status: DC | PRN

## 2018-08-06 MED ORDER — SUGAMMADEX SODIUM 200 MG/2ML IV SOLN
INTRAVENOUS | Status: DC | PRN
  Administered 2018-08-06: 200 mg via INTRAVENOUS

## 2018-08-06 MED ORDER — MIDAZOLAM HCL 2 MG/2ML IJ SOLN
INTRAMUSCULAR | Status: AC
  Filled 2018-08-06: qty 2

## 2018-08-06 MED ORDER — METHOCARBAMOL 500 MG PO TABS: 500.0000 mg | ORAL_TABLET | Freq: Four times a day (QID) | ORAL | 0 refills | Status: AC | PRN

## 2018-08-06 MED ORDER — DEXAMETHASONE SODIUM PHOSPHATE 10 MG/ML IJ SOLN
INTRAMUSCULAR | Status: AC
  Filled 2018-08-06: qty 1

## 2018-08-06 MED ORDER — CHLORHEXIDINE GLUCONATE 4 % EX LIQD
60.0000 mL | Freq: Once | CUTANEOUS | Status: AC
  Administered 2018-08-06: 4 via TOPICAL

## 2018-08-06 MED ORDER — METHOCARBAMOL 500 MG IVPB - SIMPLE MED
INTRAVENOUS | Status: AC
  Filled 2018-08-06: qty 50

## 2018-08-06 MED ORDER — SUGAMMADEX SODIUM 200 MG/2ML IV SOLN
INTRAVENOUS | Status: AC
  Filled 2018-08-06: qty 2

## 2018-08-06 MED ORDER — METOCLOPRAMIDE HCL 5 MG PO TABS: 5.0000 mg | ORAL_TABLET | Freq: Three times a day (TID) | ORAL | Status: DC | PRN

## 2018-08-06 MED ORDER — POVIDONE-IODINE 10 % EX SWAB: 2.0000 "application " | Freq: Once | CUTANEOUS | Status: DC

## 2018-08-06 MED ORDER — HYDROCODONE-ACETAMINOPHEN 5-325 MG PO TABS: 1.0000 | ORAL_TABLET | ORAL | 0 refills | Status: DC | PRN

## 2018-08-06 MED ORDER — ROCURONIUM BROMIDE 10 MG/ML (PF) SYRINGE
PREFILLED_SYRINGE | INTRAVENOUS | Status: DC | PRN
  Administered 2018-08-06: 50 mg via INTRAVENOUS

## 2018-08-06 MED ORDER — CEFAZOLIN SODIUM-DEXTROSE 2-4 GM/100ML-% IV SOLN
2.0000 g | Freq: Four times a day (QID) | INTRAVENOUS | Status: AC
  Administered 2018-08-06 – 2018-08-07 (×2): 2 g via INTRAVENOUS
  Filled 2018-08-06 (×2): qty 100

## 2018-08-06 MED ORDER — SODIUM CHLORIDE 0.9 % IR SOLN
Status: DC | PRN
  Administered 2018-08-06: 1000 mL

## 2018-08-06 MED ORDER — ONDANSETRON HCL 4 MG/2ML IJ SOLN
INTRAMUSCULAR | Status: AC
  Filled 2018-08-06: qty 2

## 2018-08-06 MED ORDER — ONDANSETRON HCL 4 MG/2ML IJ SOLN: 4.0000 mg | Freq: Four times a day (QID) | INTRAMUSCULAR | Status: DC | PRN

## 2018-08-06 MED ORDER — HYDROMORPHONE HCL 1 MG/ML IJ SOLN
0.2500 mg | INTRAMUSCULAR | Status: DC | PRN
  Administered 2018-08-06 (×2): 0.25 mg via INTRAVENOUS

## 2018-08-06 MED ORDER — PROPOFOL 10 MG/ML IV BOLUS
INTRAVENOUS | Status: DC | PRN
  Administered 2018-08-06: 120 mg via INTRAVENOUS

## 2018-08-06 MED ORDER — POLYETHYLENE GLYCOL 3350 17 G PO PACK
17.0000 g | PACK | Freq: Every day | ORAL | Status: DC | PRN
  Administered 2018-08-07: 17 g via ORAL
  Filled 2018-08-06: qty 1

## 2018-08-06 MED ORDER — POTASSIUM CHLORIDE IN NACL 20-0.45 MEQ/L-% IV SOLN
INTRAVENOUS | Status: DC
  Filled 2018-08-06: qty 1000

## 2018-08-06 MED ORDER — STERILE WATER FOR IRRIGATION IR SOLN
Status: DC | PRN
  Administered 2018-08-06: 2000 mL

## 2018-08-06 MED ORDER — HYDROCODONE-ACETAMINOPHEN 5-325 MG PO TABS
1.0000 | ORAL_TABLET | ORAL | Status: DC | PRN
Start: 2018-08-06 — End: 2018-08-08
  Administered 2018-08-07 – 2018-08-08 (×4): 1 via ORAL
  Filled 2018-08-06 (×4): qty 1

## 2018-08-06 SURGICAL SUPPLY — 46 items
BAG ZIPLOCK 12X15 (MISCELLANEOUS) ×3 IMPLANT
BALL HIP DEPUY 50 (Hips) IMPLANT
BLADE SAW SGTL 11.0X1.19X90.0M (BLADE) IMPLANT
BLADE SAW SGTL 18X1.27X75 (BLADE) ×2 IMPLANT
BLADE SAW SGTL 18X1.27X75MM (BLADE) ×1
COVER SURGICAL LIGHT HANDLE (MISCELLANEOUS) ×3 IMPLANT
DERMABOND ADVANCED (GAUZE/BANDAGES/DRESSINGS) ×2
DERMABOND ADVANCED .7 DNX12 (GAUZE/BANDAGES/DRESSINGS) ×1 IMPLANT
DRAPE ORTHO SPLIT 77X108 STRL (DRAPES) ×4
DRAPE POUCH INSTRU U-SHP 10X18 (DRAPES) ×3 IMPLANT
DRAPE SURG 17X11 SM STRL (DRAPES) ×3 IMPLANT
DRAPE SURG ORHT 6 SPLT 77X108 (DRAPES) ×2 IMPLANT
DRAPE U-SHAPE 47X51 STRL (DRAPES) ×3 IMPLANT
DRSG AQUACEL AG ADV 3.5X10 (GAUZE/BANDAGES/DRESSINGS) ×3 IMPLANT
DRSG TEGADERM 4X4.75 (GAUZE/BANDAGES/DRESSINGS) ×3 IMPLANT
DURAPREP 26ML APPLICATOR (WOUND CARE) ×3 IMPLANT
ELECT BLADE TIP CTD 4 INCH (ELECTRODE) ×3 IMPLANT
ELECT REM PT RETURN 15FT ADLT (MISCELLANEOUS) ×3 IMPLANT
GLOVE BIOGEL M 7.0 STRL (GLOVE) IMPLANT
GLOVE BIOGEL PI IND STRL 7.5 (GLOVE) ×1 IMPLANT
GLOVE BIOGEL PI IND STRL 8.5 (GLOVE) ×1 IMPLANT
GLOVE BIOGEL PI IND STRL 9 (GLOVE) ×1 IMPLANT
GLOVE BIOGEL PI INDICATOR 7.5 (GLOVE) ×2
GLOVE BIOGEL PI INDICATOR 8.5 (GLOVE) ×2
GLOVE BIOGEL PI INDICATOR 9 (GLOVE) ×2
GLOVE ECLIPSE 8.5 STRL (GLOVE) IMPLANT
GLOVE ORTHO TXT STRL SZ7.5 (GLOVE) ×6 IMPLANT
GOWN STRL REUS W/TWL 2XL LVL3 (GOWN DISPOSABLE) ×3 IMPLANT
GOWN STRL REUS W/TWL LRG LVL3 (GOWN DISPOSABLE) ×3 IMPLANT
GOWN STRL REUS W/TWL XL LVL3 (GOWN DISPOSABLE) ×6 IMPLANT
HIP BALL DEPUY 50 (Hips) ×3 IMPLANT
KIT BASIN OR (CUSTOM PROCEDURE TRAY) ×3 IMPLANT
MANIFOLD NEPTUNE II (INSTRUMENTS) ×3 IMPLANT
PACK TOTAL KNEE CUSTOM (KITS) ×3 IMPLANT
POSITIONER SURGICAL ARM (MISCELLANEOUS) ×3 IMPLANT
SPACER FEM TAPERED +0 12/14 (Hips) ×2 IMPLANT
STEM TRI LOC BPS SZ6 W GRIPTON IMPLANT
SUT MNCRL AB 4-0 PS2 18 (SUTURE) ×3 IMPLANT
SUT STRATAFIX 0 PDS 27 VIOLET (SUTURE) ×3
SUT VIC AB 1 CT1 36 (SUTURE) ×3 IMPLANT
SUT VIC AB 2-0 CT1 27 (SUTURE) ×4
SUT VIC AB 2-0 CT1 TAPERPNT 27 (SUTURE) ×2 IMPLANT
SUTURE STRATFX 0 PDS 27 VIOLET (SUTURE) ×1 IMPLANT
TOWEL OR 17X26 10 PK STRL BLUE (TOWEL DISPOSABLE) ×3 IMPLANT
TRAY FOLEY MTR SLVR 16FR STAT (SET/KITS/TRAYS/PACK) ×3 IMPLANT
TRI LOC BPS SZ 6 W GRIPTON ×3 IMPLANT

## 2018-08-06 NOTE — Anesthesia Procedure Notes (Signed)
Procedure Name: Intubation Date/Time: 08/06/2018 2:27 PM Performed by: Lind Covert, CRNA Pre-anesthesia Checklist: Patient identified, Emergency Drugs available, Suction available, Patient being monitored and Timeout performed Patient Re-evaluated:Patient Re-evaluated prior to induction Oxygen Delivery Method: Circle system utilized Preoxygenation: Pre-oxygenation with 100% oxygen Ventilation: Mask ventilation without difficulty Laryngoscope Size: Mac and 4 Tube type: Oral Tube size: 7.5 mm Number of attempts: 1 Airway Equipment and Method: Stylet Placement Confirmation: ETT inserted through vocal cords under direct vision,  positive ETCO2 and breath sounds checked- equal and bilateral Secured at: 22 cm Dental Injury: Teeth and Oropharynx as per pre-operative assessment

## 2018-08-06 NOTE — Anesthesia Preprocedure Evaluation (Signed)
Anesthesia Evaluation  Patient identified by MRN, date of birth, ID band Patient awake    Reviewed: Allergy & Precautions, NPO status , Patient's Chart, lab work & pertinent test results  Airway Mallampati: I  TM Distance: >3 FB Neck ROM: Full    Dental   Pulmonary former smoker,    Pulmonary exam normal        Cardiovascular hypertension, Pt. on medications + CAD, + Past MI and + Cardiac Stents  Normal cardiovascular exam+ dysrhythmias Atrial Fibrillation      Neuro/Psych    GI/Hepatic   Endo/Other    Renal/GU      Musculoskeletal   Abdominal   Peds  Hematology   Anesthesia Other Findings   Reproductive/Obstetrics                             Anesthesia Physical Anesthesia Plan  ASA: III  Anesthesia Plan: General   Post-op Pain Management:    Induction: Intravenous  PONV Risk Score and Plan: 2 and Ondansetron and Treatment may vary due to age or medical condition  Airway Management Planned: Oral ETT  Additional Equipment:   Intra-op Plan:   Post-operative Plan: Extubation in OR  Informed Consent: I have reviewed the patients History and Physical, chart, labs and discussed the procedure including the risks, benefits and alternatives for the proposed anesthesia with the patient or authorized representative who has indicated his/her understanding and acceptance.     Plan Discussed with: CRNA and Surgeon  Anesthesia Plan Comments:         Anesthesia Quick Evaluation

## 2018-08-06 NOTE — Transfer of Care (Signed)
Immediate Anesthesia Transfer of Care Note  Patient: [name]  Procedure(s) Performed: ARTHROPLASTY BIPOLAR HIP (HEMIARTHROPLASTY) (Left Hip)  Patient Location: PACU  Anesthesia Type:General  Level of Consciousness: sedated  Airway & Oxygen Therapy: Patient Spontanous Breathing and Patient connected to face mask oxygen  Post-op Assessment: Report given to RN and Post -op Vital signs reviewed and stable  Post vital signs: Reviewed and stable  Last Vitals:  Vitals Value Taken Time  BP    Temp    Pulse 69 08/06/2018  3:41 PM  Resp 12 08/06/2018  3:41 PM  SpO2 97 % 08/06/2018  3:41 PM  Vitals shown include unvalidated device data.  Last Pain:  Vitals:   08/06/18 0730  TempSrc:   PainSc: 3       Patients Stated Pain Goal: 2 (16/10/96 0454)  Complications: No apparent anesthesia complications

## 2018-08-06 NOTE — Progress Notes (Signed)
Patient ID: [name], male   DOB: [birth year], 77 y.o.   MRN: [id number]  PROGRESS NOTE    Joven Mom  SWN:[id number] DOB: Mar 30, 1941 DOA: 08/05/2018 PCP: Caren Macadam, MD   Brief Narrative:  77 year old male with history of atrial fibrillation not on anticoagulation due to history of frequent falls, rheumatoid arthritis, hypothyroidism, glaucoma, hypertension presented 08/05/2018 with fall with left hip pain.  He was found to have left femoral neck fracture.  Orthopedics was consulted who recommended that the patient be transferred to [facility] long hospital..   Assessment & Plan:   Principal Problem:   Closed left hip fracture ([place]) Active Problems:   A-fib ([place])   Essential hypertension   Hypothyroid   HLD (hyperlipidemia)   Closed left femoral neck fracture after a mechanical fall -Orthopedics following.  Plan for surgical intervention today.  Continue n.p.o.  Continue pain management -Fall precautions  Atrial fibrillation with bradycardia -Heart rate is probably at baseline.  Not on any rate controlling medications.  Not on anticoagulation because of history of frequent falls.  Hypothyroidism -Continue Synthroid  Hyperlipidemia -Continue statin  Hypertension -Blood pressure controlled.  Continue Lasix  Rheumatoid arthritis -Continue methotrexate   DVT prophylaxis: Cutaneous heparin Code Status: Full Family Communication: None at bedside Disposition Plan: Depends on clinical outcome  Consultants: Orthopedics  Procedures: None  Antimicrobials: None   Subjective: Patient seen and examined at bedside.  He complains of left hip pain.  No overnight fever, nausea or vomiting.  Objective: Vitals:   08/05/18 1330 08/05/18 1452 08/05/18 2117 08/06/18 0514  BP: (!) 142/62 (!) 122/58 (!) 106/59 114/66  Pulse: (!) 54 68 (!) 40 (!) 54  Resp: 13 15 16 16   Temp:  98.7 F (37.1 C) 99.3 F (37.4 C) 98 F (36.7 C)  TempSrc:  Oral Oral Oral  SpO2: 96% 100%  95% 95%  Weight:      Height:        Intake/Output Summary (Last 24 hours) at 08/06/2018 1028 Last data filed at 08/06/2018 9381 Gross per 24 hour  Intake 360 ml  Output 1200 ml  Net -840 ml   Filed Weights   08/05/18 0911  Weight: 79.4 kg    Examination:  General exam: Appears calm and comfortable  Respiratory system: Bilateral decreased breath sounds at bases Cardiovascular system: S1 & S2 heard, intermittent bradycardia Gastrointestinal system: Abdomen is nondistended, soft and nontender. Normal bowel sounds heard. Extremities: No cyanosis, clubbing, edema       Data Reviewed: I have personally reviewed following labs and imaging studies  CBC: Recent Labs  Lab 08/05/18 1009 08/05/18 1543 08/06/18 0521  WBC 11.0* 11.3* 9.6  NEUTROABS 8.0*  --   --   HGB 13.8 12.9* 12.7*  HCT 41.2 39.1 38.6*  MCV 103.5* 103.2* 102.4*  PLT 231 226 829   Basic Metabolic Panel: Recent Labs  Lab 08/05/18 1009 08/05/18 1543 08/06/18 0521  NA 140  --  142  K 4.0  --  3.9  CL 103  --  106  CO2 31  --  28  GLUCOSE 102*  --  128*  BUN 12  --  12  CREATININE 0.88 0.84 0.75  CALCIUM 9.0  --  8.6*   GFR: Estimated Creatinine Clearance: 81.1 mL/min (by C-G formula based on SCr of 0.75 mg/dL). Liver Function Tests: No results for input(s): AST, ALT, ALKPHOS, BILITOT, PROT, ALBUMIN in the last 168 hours. No results for input(s): LIPASE, AMYLASE in the last 168 hours. No  results for input(s): AMMONIA in the last 168 hours. Coagulation Profile: Recent Labs  Lab 08/05/18 1009  INR 1.14   Cardiac Enzymes: No results for input(s): CKTOTAL, CKMB, CKMBINDEX, TROPONINI in the last 168 hours. BNP (last 3 results) No results for input(s): PROBNP in the last 8760 hours. HbA1C: No results for input(s): HGBA1C in the last 72 hours. CBG: No results for input(s): GLUCAP in the last 168 hours. Lipid Profile: No results for input(s): CHOL, HDL, LDLCALC, TRIG, CHOLHDL, LDLDIRECT in the  last 72 hours. Thyroid Function Tests: No results for input(s): TSH, T4TOTAL, FREET4, T3FREE, THYROIDAB in the last 72 hours. Anemia Panel: No results for input(s): VITAMINB12, FOLATE, FERRITIN, TIBC, IRON, RETICCTPCT in the last 72 hours. Sepsis Labs: No results for input(s): PROCALCITON, LATICACIDVEN in the last 168 hours.  No results found for this or any previous visit (from the past 240 hour(s)).       Radiology Studies: Dg Chest 1 View  Result Date: 08/05/2018 CLINICAL DATA:  Atrial fibrillation EXAM: CHEST  1 VIEW COMPARISON:  December 10, 2017 FINDINGS: There is no edema or consolidation. There is cardiomegaly with pulmonary vascularity normal. There is aortic atherosclerosis. There is deviation of the upper thoracic trachea to the left. There is degenerative change in the right shoulder with superior migration of the right humeral head. IMPRESSION: 1. Cardiomegaly. No edema or consolidation. Aortic atherosclerosis. 2. Shift of the upper thoracic trachea to the left. This finding was not present on prior study. Question localized thyroid enlargement in this area. 3. 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 Aortic Atherosclerosis (ICD10-I70.0). Electronically Signed   By: Lowella Grip III M.D.   On: 08/05/2018 10:04   Dg Hip Unilat W Or Wo Pelvis 2-3 Views Left  Result Date: 08/05/2018 CLINICAL DATA:  Status post fall, hip pain EXAM: DG HIP (WITH OR WITHOUT PELVIS) 2-3V LEFT COMPARISON:  None. FINDINGS: Generalized osteopenia. Mildly displaced and angulated left femoral neck fracture. No other fracture or dislocation. Peripheral vascular atherosclerotic disease. IMPRESSION: Mildly displaced and angulated left femoral neck fracture. Electronically Signed   By: Kathreen Devoid   On: 08/05/2018 10:05        Scheduled Meds: . donepezil  10 mg Oral QHS  . ferrous sulfate  325 mg Oral BID WC  . folic acid  1 mg Oral Daily  .  furosemide  40 mg Oral Daily  . heparin  5,000 Units Subcutaneous Q8H  . levothyroxine  75 mcg Oral QAC breakfast  . [START ON 08/11/2018] methotrexate  15 mg Oral Q Sun  . potassium chloride SA  20 mEq Oral BID  . simvastatin  40 mg Oral Daily  . timolol  1 drop Both Eyes BID  . cyanocobalamin  1,000 mcg Oral Daily   Continuous Infusions: . methocarbamol (ROBAXIN) IV       LOS: 1 day        Aline August, MD [facility] Pager [phone]  If 7PM-7AM, please contact night-coverage [email or web address] Password [facility] 08/06/2018, 10:28 AM

## 2018-08-06 NOTE — Anesthesia Postprocedure Evaluation (Signed)
Anesthesia Post Note  Patient: [name]  Procedure(s) Performed: ARTHROPLASTY BIPOLAR HIP (HEMIARTHROPLASTY) (Left Hip)     Patient location during evaluation: PACU Anesthesia Type: General Level of consciousness: awake and alert Pain management: pain level controlled Vital Signs Assessment: post-procedure vital signs reviewed and stable Respiratory status: spontaneous breathing, nonlabored ventilation, respiratory function stable and patient connected to nasal cannula oxygen Cardiovascular status: blood pressure returned to baseline and stable Postop Assessment: no apparent nausea or vomiting Anesthetic complications: no    Last Vitals:  Vitals:   08/06/18 1645 08/06/18 1722  BP: (P) 120/75 (!) 168/76  Pulse:  (!) 56  Resp:  16  Temp:  36.7 C  SpO2: (P) 95% 94%    Last Pain:  Vitals:   08/06/18 1722  TempSrc: Oral  PainSc:                  Eann Cleland DAVID

## 2018-08-06 NOTE — Interval H&P Note (Signed)
History and Physical Interval Note:  08/06/2018 1:45 PM  [name]  has presented today for surgery, with the diagnosis of left femoral neck fracture  The various methods of treatment have been discussed with the patient and family. After consideration of risks, benefits and other options for treatment, the patient has consented to  Procedure(s): ARTHROPLASTY BIPOLAR HIP (HEMIARTHROPLASTY) (Left) as a surgical intervention .  The patient's history has been reviewed, patient examined, no change in status, stable for surgery.  I have reviewed the patient's chart and labs.  Questions were answered to the patient's satisfaction.     Mauri Pole

## 2018-08-06 NOTE — Interval H&P Note (Signed)
History and Physical Interval Note:  08/06/2018 1:26 PM  [name]  has presented today for surgery, with the diagnosis of left femoral neck fracture  The various methods of treatment have been discussed with the patient and family. After consideration of risks, benefits and other options for treatment, the patient has consented to  Procedure(s): ARTHROPLASTY BIPOLAR HIP (HEMIARTHROPLASTY) (Left) as a surgical intervention .  The patient's history has been reviewed, patient examined, no change in status, stable for surgery.  I have reviewed the patient's chart and labs.  Questions were answered to the patient's satisfaction.     Mauri Pole

## 2018-08-06 NOTE — Op Note (Signed)
NAME:  [name]                ACCOUNT NO.:  [id number]   MEDICAL RECORD NO.: [id number]   LOCATION:  8242                         FACILITY:  [facility]   DATE OF BIRTH:  Jul 31, 2041  PHYSICIAN:  Pietro Cassis. Alvan Dame, M.D.     DATE OF PROCEDURE:  08/06/2018                               OPERATIVE REPORT     PREOPERATIVE DIAGNOSIS:  Left displaced femoral neck fracture.   POSTOPERATIVE DIAGNOSIS:  Left displaced femoral neck fracture.   PROCEDURE:  Left hip hemiarthroplasty utilizing DePuy component, size 6 high offset Tri-Lock stem with a 83mm unipolar ball with a +0 adapter.   SURGEON:  Pietro Cassis. Alvan Dame, MD   ASSISTANT:  Danae Orleans, PA-C.   ANESTHESIA:  General.   SPECIMENS:  None.   DRAINS:  None.   BLOOD LOSS:  About 200 cc.   COMPLICATIONS:  None.   INDICATION OF PROCEDURE:  [name] is a pleasant 77 year old male who lives independently.  He unfortunately had a fall at his house.  He was admitted to the hospital after radiographs revealed a femoral neck fracture.  He was seen and evaluated and was scheduled for surgery for fixation.  The necessity of surgical repair was discussed with she and her family.  Consent was obtained after reviewing risks of infection, DVT, component failure, and need for revision surgery.   PROCEDURE IN DETAIL:  The patient was brought to the operative theater. Once adequate anesthesia, preoperative antibiotics, 2 g of Ancef, 1 gm of Tranexamic Acid, and decadron administered, the patient was positioned into the right lateral decubitus position with the left side up.  The left lower extremity was then prepped and draped in sterile fashion.  A time-out was performed identifying the patient, planned procedure, and extremity.   A lateral incision was made off the proximal trochanter. Sharp dissection was carried down to the iliotibial band and gluteal fascia. The gluteal fascia was then incised for posterior approach.  The short external  rotators were taken down separate from the posterior capsule. An L capsulotomy was made preserving the posterior leaflet for later anatomic repair. Fracture site was identified and after removing comminuted segments of the posterior femoral neck, the femoral head was removed without difficulty and measured on the back table  using the sizing rings and determined to be 50 mm in diameter.   The proximal femur was then exposed.  Retractors placed.  I then drilled, opened the proximal femur.  Then I hand reamed once and  Irrigated the canal to try to prevent fat emboli.  I began broaching the femur with a starter broach up to a size 6 broach with good medial and lateral metaphyseal fit without evidence of any torsion or movement.  A trial reduction was carried out with a high offset neck and a +0 adapter with a 24mm ball.  The hip reduced nicely.  The leg lengths appeared to be equal compared to the down leg.   The hip went through a range of motion without evidence of any subluxation or impingement.   Given these findings, the trial components removed.  The final 6 high offset Tri-Lock stem was opened.  After  irrigating the canal, the final stem was impacted and sat at the level where the broach was. Based on this and the trial reduction, a +0 adapter was opened and impacted in the 50 mm unipolar ball onto a clean and dry trunnion.  The hip had been irrigated throughout the case and again at this point.  I re- Approximated the posterior capsule to the superior leaflet using a  #1 Vicryl.  The remainder of the wound was closed with #1 Vicryl in the iliotibial band and gluteal fascia, a  2-0 Vicryl in the sub-Q tissue and a running 4-0 Monocryl in the skin.  The hip was cleaned, dried, and dressed sterilely using Dermabond and Aquacel dressing.  He was then brought to recovery room, extubated in stable condition, tolerating the procedure well.  Danae Orleans, PA-C was present and utilized as  Environmental consultant for the entire case from  Preoperative positioning to management of the contralateral extremity and retractors to  General facilitation of the procedure.  He was also involved with primary wound closure.         Pietro Cassis Alvan Dame, M.D.

## 2018-08-06 NOTE — H&P (View-Only) (Signed)
Patient ID: [name], male   DOB: 1941-08-25, 77 y.o.   MRN: [id number] Subjective: Right femoral neck fracture    Patient reports pain as moderate with movement otherwise ok  Objective:   VITALS:   Vitals:   08/05/18 2117 08/06/18 0514  BP: (!) 106/59 114/66  Pulse: (!) 40 (!) 54  Resp: 16 16  Temp: 99.3 F (37.4 C) 98 F (36.7 C)  SpO2: 95% 95%    Neurovascular intact  RLE slight ER   LABS Recent Labs    08/05/18 1009 08/05/18 1543 08/06/18 0521  HGB 13.8 12.9* 12.7*  HCT 41.2 39.1 38.6*  WBC 11.0* 11.3* 9.6  PLT 231 226 239    Recent Labs    08/05/18 1009 08/05/18 1543 08/06/18 0521  NA 140  --  142  K 4.0  --  3.9  BUN 12  --  12  CREATININE 0.88 0.84 0.75  GLUCOSE 102*  --  128*    Recent Labs    08/05/18 1009  INR 1.14     Assessment/Plan: Right femoral nec fracture  To OR today for right hip hemiarthroplasty

## 2018-08-06 NOTE — Progress Notes (Addendum)
Patient ID: [name], male   DOB: [birth year], 77 y.o.   MRN: [id number] Subjective: Left femoral neck fracture    Patient reports pain as moderate with movement otherwise ok  Objective:   VITALS:   Vitals:   08/05/18 2117 08/06/18 0514  BP: (!) 106/59 114/66  Pulse: (!) 40 (!) 54  Resp: 16 16  Temp: 99.3 F (37.4 C) 98 F (36.7 C)  SpO2: 95% 95%    Neurovascular intact  LLE slight ER   LABS Recent Labs    08/05/18 1009 08/05/18 1543 08/06/18 0521  HGB 13.8 12.9* 12.7*  HCT 41.2 39.1 38.6*  WBC 11.0* 11.3* 9.6  PLT 231 226 239    Recent Labs    08/05/18 1009 08/05/18 1543 08/06/18 0521  NA 140  --  142  K 4.0  --  3.9  BUN 12  --  12  CREATININE 0.88 0.84 0.75  GLUCOSE 102*  --  128*    Recent Labs    08/05/18 1009  INR 1.14     Assessment/Plan: Left femoral nec fracture  To OR today for left hip hemiarthroplasty

## 2018-08-07 ENCOUNTER — Encounter (HOSPITAL_COMMUNITY): Payer: Self-pay | Admitting: Orthopedic Surgery

## 2018-08-07 DIAGNOSIS — E785 Hyperlipidemia, unspecified: Secondary | ICD-10-CM

## 2018-08-07 LAB — CBC
HCT: 39.5 % (ref 39.0–52.0)
Hemoglobin: 13.2 g/dL (ref 13.0–17.0)
MCH: 34.1 pg — AB (ref 26.0–34.0)
MCHC: 33.4 g/dL (ref 30.0–36.0)
MCV: 102.1 fL — AB (ref 78.0–100.0)
Platelets: 220 10*3/uL (ref 150–400)
RBC: 3.87 MIL/uL — AB (ref 4.22–5.81)
RDW: 13.7 % (ref 11.5–15.5)
WBC: 12.5 10*3/uL — ABNORMAL HIGH (ref 4.0–10.5)

## 2018-08-07 LAB — BASIC METABOLIC PANEL
Anion gap: 8 (ref 5–15)
BUN: 17 mg/dL (ref 8–23)
CO2: 28 mmol/L (ref 22–32)
CREATININE: 0.9 mg/dL (ref 0.61–1.24)
Calcium: 8.6 mg/dL — ABNORMAL LOW (ref 8.9–10.3)
Chloride: 102 mmol/L (ref 98–111)
GFR calc Af Amer: >60 mL/min (ref >60)
GLUCOSE: 146 mg/dL — AB (ref 70–99)
POTASSIUM: 3.8 mmol/L (ref 3.5–5.1)
SODIUM: 138 mmol/L (ref 135–145)

## 2018-08-07 MED ORDER — POTASSIUM CHLORIDE 20 MEQ/15ML (10%) PO SOLN
20.0000 meq | Freq: Two times a day (BID) | ORAL | Status: DC
  Administered 2018-08-07 – 2018-08-08 (×3): 20 meq via ORAL
  Filled 2018-08-07 (×3): qty 15

## 2018-08-07 MED ORDER — SODIUM CHLORIDE 0.9 % IV SOLN
INTRAVENOUS | Status: DC | PRN
  Administered 2018-08-07: 11:00:00 via INTRAVENOUS

## 2018-08-07 NOTE — Progress Notes (Signed)
Patient ID: [name], male   DOB: 1941-03-03, 77 y.o.   MRN: [id number]  PROGRESS NOTE    [name]  DJS:[id number] DOB: March 04, 1941 DOA: 08/05/2018 PCP: Caren Macadam, MD   Brief Narrative:  77 year old male with history of atrial fibrillation not on anticoagulation due to history of frequent falls, rheumatoid arthritis, hypothyroidism, glaucoma, hypertension presented 08/05/2018 with fall with left hip pain.  He was found to have left femoral neck fracture.  Orthopedics was consulted who recommended that the patient be transferred to [facility].  Patient had surgical intervention on 08/06/2018 by orthopedics.   Assessment & Plan:   Principal Problem:   Closed left hip fracture ([place]) Active Problems:   A-fib (HCC)   Essential hypertension   Hypothyroid   HLD (hyperlipidemia)   Closed left femoral neck fracture after a mechanical fall -Orthopedics following.  Status post left hip hemiarthroplasty on 08/06/2018 by orthopedics.  Wound care as per orthopedics recommendations.  PT/OT eval.  Social worker consult for probable rehab placement.  Continue pain management -Fall precautions  Atrial fibrillation with bradycardia -Heart rate is probably at baseline.  Not on any rate controlling medications.  Not on anticoagulation because of history of frequent falls.  Hypothyroidism -Continue Synthroid  Hyperlipidemia -Continue statin  Hypertension -Blood pressure controlled.  Continue Lasix  Rheumatoid arthritis -Continue methotrexate   DVT prophylaxis: Aspirin twice daily as per orthopedics Code Status: Full Family Communication: None at bedside Disposition Plan: Might need placement in a rehab facility  Consultants: Orthopedics  Procedures: Hip hemiarthroplasty on 08/06/2018  Antimicrobials: Preoperative antibiotics   Subjective: Patient seen and examined at bedside.  He is awake but seems slightly confused.  Complains of mild left hip pain.  No  overnight fever or vomiting. Objective: Vitals:   08/06/18 1914 08/06/18 2024 08/07/18 0106 08/07/18 0647  BP: 128/85 122/71 (!) 141/45 136/81  Pulse: (!) 51 61 62 (!) 57  Resp: 16 16 16 17   Temp: 99 F (37.2 C) 98.8 F (37.1 C) 98.3 F (36.8 C) 98.7 F (37.1 C)  TempSrc: Oral Oral Oral Oral  SpO2: 96% 95% 94% 93%  Weight:      Height:        Intake/Output Summary (Last 24 hours) at 08/07/2018 1002 Last data filed at 08/07/2018 0700 Gross per 24 hour  Intake 1508.75 ml  Output 1225 ml  Net 283.75 ml   Filed Weights   08/05/18 0911  Weight: 79.4 kg    Examination:  General exam: Awake but appears slightly confused  respiratory system: Bilateral decreased breath sounds at bases with no crackles Cardiovascular system: S1 & S2 heard, intermittent bradycardia Gastrointestinal system: Abdomen is nondistended, soft and nontender. Normal bowel sounds heard. Extremities: No cyanosis, clubbing, edema       Data Reviewed: I have personally reviewed following labs and imaging studies  CBC: Recent Labs  Lab 08/05/18 1009 08/05/18 1543 08/06/18 0521 08/07/18 0529  WBC 11.0* 11.3* 9.6 12.5*  NEUTROABS 8.0*  --   --   --   HGB 13.8 12.9* 12.7* 13.2  HCT 41.2 39.1 38.6* 39.5  MCV 103.5* 103.2* 102.4* 102.1*  PLT 231 226 239 885   Basic Metabolic Panel: Recent Labs  Lab 08/05/18 1009 08/05/18 1543 08/06/18 0521 08/07/18 0529  NA 140  --  142 138  K 4.0  --  3.9 3.8  CL 103  --  106 102  CO2 31  --  28 28  GLUCOSE 102*  --  128* 146*  BUN 12  --  12 17  CREATININE 0.88 0.84 0.75 0.90  CALCIUM 9.0  --  8.6* 8.6*   GFR: Estimated Creatinine Clearance: 72.1 mL/min (by C-G formula based on SCr of 0.9 mg/dL). Liver Function Tests: No results for input(s): AST, ALT, ALKPHOS, BILITOT, PROT, ALBUMIN in the last 168 hours. No results for input(s): LIPASE, AMYLASE in the last 168 hours. No results for input(s): AMMONIA in the last 168 hours. Coagulation  Profile: Recent Labs  Lab 08/05/18 1009  INR 1.14   Cardiac Enzymes: No results for input(s): CKTOTAL, CKMB, CKMBINDEX, TROPONINI in the last 168 hours. BNP (last 3 results) No results for input(s): PROBNP in the last 8760 hours. HbA1C: No results for input(s): HGBA1C in the last 72 hours. CBG: No results for input(s): GLUCAP in the last 168 hours. Lipid Profile: No results for input(s): CHOL, HDL, LDLCALC, TRIG, CHOLHDL, LDLDIRECT in the last 72 hours. Thyroid Function Tests: No results for input(s): TSH, T4TOTAL, FREET4, T3FREE, THYROIDAB in the last 72 hours. Anemia Panel: No results for input(s): VITAMINB12, FOLATE, FERRITIN, TIBC, IRON, RETICCTPCT in the last 72 hours. Sepsis Labs: No results for input(s): PROCALCITON, LATICACIDVEN in the last 168 hours.  No results found for this or any previous visit (from the past 240 hour(s)).       Radiology Studies: Pelvis Portable  Result Date: 08/06/2018 CLINICAL DATA:  Postop left hip replacement EXAM: PORTABLE PELVIS 1-2 VIEWS COMPARISON:  None. FINDINGS: Left hip hemiarthroplasty with associated soft tissue gas. Alignment is normal. No periprosthetic abnormality. IMPRESSION: Expected postoperative appearance of left hip hemiarthroplasty. Electronically Signed   By: Ulyses Jarred M.D.   On: 08/06/2018 16:39        Scheduled Meds: . aspirin EC  325 mg Oral BID  . docusate sodium  100 mg Oral BID  . donepezil  10 mg Oral QHS  . ferrous sulfate  325 mg Oral BID WC  . folic acid  1 mg Oral Daily  . furosemide  40 mg Oral Daily  . levothyroxine  75 mcg Oral QAC breakfast  . [START ON 08/11/2018] methotrexate  15 mg Oral Q Sun  . potassium chloride  20 mEq Oral BID  . simvastatin  40 mg Oral Daily  . timolol  1 drop Both Eyes BID  . cyanocobalamin  1,000 mcg Oral Daily   Continuous Infusions: . methocarbamol (ROBAXIN) IV       LOS: 2 days        Aline August, MD [facility] Pager [phone]  If  7PM-7AM, please contact night-coverage [email or web address] Password [email or web address] 08/07/2018, 10:02 AM

## 2018-08-07 NOTE — NC FL2 (Signed)
[place] LEVEL OF CARE SCREENING TOOL     IDENTIFICATION  Patient Name: [name] Birthdate: Mar 01, 1941 Sex: male Admission Date (Current Location): 08/05/2018  [facility] and [place] Number:  Herbalist and Address:  [facility],  [place] [address] [place]      Provider Number: [id number]  Attending Physician Name and Address:  Aline August, MD  Relative Name and Phone Number:       Current Level of Care: Hospital Recommended Level of Care: [place] Prior Approval Number:    Date Approved/Denied:   PASRR Number: [id number]  Discharge Plan: SNF    Current Diagnoses: Patient Active Problem List   Diagnosis Date Noted  . Closed left hip fracture ([place]) 08/05/2018  . Memory loss 06/19/2018  . Sleep disorder 12/19/2017  . 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 Sinus bradycardia 12/14/2017  . NSVT (nonsustained ventricular tachycardia) ([place]) 12/14/2017  . Ataxia 12/10/2017  . Unsteady gait 12/10/2017  . Cellulitis of right lower extremity 12/10/2017  . Basal cell carcinoma (BCC) 08/29/2017  . Actinic keratosis 08/20/2017  . Essential hypertension 04/30/2017  . Hypothyroid 04/30/2017  . HLD (hyperlipidemia) 04/30/2017  . CAD in native artery 04/30/2017  . 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 Pedal edema 04/30/2017  . Glaucoma 04/30/2017  . Abdominal aortic atherosclerosis ([place]) 04/30/2017  . 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 Asymptomatic gallstones 04/30/2017  . Benign prostatic hyperplasia 04/30/2017  . Diverticulosis 04/30/2017  . Esophagitis 04/30/2017  . Angular cheilitis 04/30/2017  . A-fib ([place]) 04/11/2017  . Rheumatoid arthritis ([place]) 04/11/2017    Orientation RESPIRATION BLADDER Height & Weight     Self, Situation, Place  Normal Continent Weight: 175 lb (79.4 kg) Height:  5\' 10"  (177.8 cm)  BEHAVIORAL SYMPTOMS/MOOD NEUROLOGICAL BOWEL NUTRITION  STATUS      Continent Diet(Heart Healthy )  AMBULATORY STATUS COMMUNICATION OF NEEDS Skin   Extensive Assist Verbally Surgical wounds                       Personal Care Assistance Level of Assistance  Bathing, Feeding, Dressing Bathing Assistance: Maximum assistance Feeding assistance: Independent Dressing Assistance: Maximum assistance     Functional Limitations Info  Sight, Hearing, Speech Sight Info: Impaired Hearing Info: Impaired Speech Info: Adequate    SPECIAL CARE FACTORS FREQUENCY  PT (By licensed PT), OT (By licensed OT)     PT Frequency: 7x/week OT Frequency: 7x/week             Contractures Contractures Info: Not present    Additional Factors Info  Code Status, Allergies, Psychotropic Code Status Info: Fullcode  Allergies Info: Allergies: Metoprolol           Current Medications (08/07/2018):  This is the current hospital active medication list Current Facility-Administered Medications  Medication Dose Route Frequency Provider Last Rate Last Dose  . 0.9 %  sodium chloride infusion   Intravenous PRN Paralee Cancel, MD      . aspirin EC tablet 325 mg  325 mg Oral BID Danae Orleans, PA-C   325 mg at 08/07/18 7858  . docusate sodium (COLACE) capsule 100 mg  100 mg Oral BID Danae Orleans, PA-C   100 mg at 08/06/18 2139  . donepezil (ARICEPT) tablet 10 mg  10 mg Oral QHS Danae Orleans, PA-C   10 mg at 08/06/18 2139  . ferrous sulfate tablet 325 mg  325 mg Oral  BID WC Danae Orleans, PA-C   325 mg at 08/07/18 0930  . folic acid (FOLVITE) tablet 1 mg  1 mg Oral Daily Danae Orleans, PA-C   1 mg at 08/07/18 9450  . furosemide (LASIX) tablet 40 mg  40 mg Oral Daily Danae Orleans, PA-C   40 mg at 08/07/18 3888  . HYDROcodone-acetaminophen (NORCO/VICODIN) 5-325 MG per tablet 1-2 tablet  1-2 tablet Oral Q4H PRN Danae Orleans, PA-C   1 tablet at 08/07/18 2800  . levothyroxine (SYNTHROID, LEVOTHROID) tablet 75 mcg  75 mcg Oral QAC breakfast Danae Orleans, PA-C   75 mcg at 08/07/18 3491  . menthol-cetylpyridinium (CEPACOL) lozenge 3 mg  1 lozenge Oral PRN Danae Orleans, PA-C       Or  . phenol (CHLORASEPTIC) mouth spray 1 spray  1 spray Mouth/Throat PRN Babish, Rodman [place], PA-C      . methocarbamol (ROBAXIN) tablet 500 mg  500 mg Oral Q6H PRN Danae Orleans, PA-C   500 mg at 08/06/18 2138   Or  . methocarbamol (ROBAXIN) 500 mg in dextrose 5 % 50 mL IVPB  500 mg Intravenous Q6H PRN Danae Orleans, PA-C      . [START ON 08/11/2018] methotrexate (RHEUMATREX) tablet 15 mg  15 mg Oral Q Sun Babish, Matthew, PA-C      . metoCLOPramide (REGLAN) tablet 5-10 mg  5-10 mg Oral Q8H PRN Danae Orleans, PA-C       Or  . metoCLOPramide (REGLAN) injection 5-10 mg  5-10 mg Intravenous Q8H PRN Danae Orleans, PA-C      . morphine 2 MG/ML injection 0.5 mg  0.5 mg Intravenous Q2H PRN Danae Orleans, PA-C   0.5 mg at 08/06/18 0534  . ondansetron (ZOFRAN) tablet 4 mg  4 mg Oral Q6H PRN Danae Orleans, PA-C       Or  . ondansetron (ZOFRAN) injection 4 mg  4 mg Intravenous Q6H PRN Babish, Matthew, PA-C      . polyethylene glycol (MIRALAX / GLYCOLAX) packet 17 g  17 g Oral Daily PRN Danae Orleans, PA-C   17 g at 08/07/18 0925  . potassium chloride 20 MEQ/15ML (10%) solution 20 mEq  20 mEq Oral BID Lenis Noon, [facility]      . simvastatin (ZOCOR) tablet 40 mg  40 mg Oral Daily Danae Orleans, PA-C   40 mg at 08/07/18 7915  . timolol (TIMOPTIC) 0.5 % ophthalmic solution 1 drop  1 drop Both Eyes BID Danae Orleans, PA-C   1 drop at 08/07/18 0932  . vitamin B-12 (CYANOCOBALAMIN) tablet 1,000 mcg  1,000 mcg Oral Daily Danae Orleans, PA-C   1,000 mcg at 08/07/18 0569     Discharge Medications: Please see discharge summary for a list of discharge medications.  Relevant Imaging Results:  Relevant Lab Results:   Additional Information SSN [id number]   Lia Hopping, [place]

## 2018-08-07 NOTE — Evaluation (Signed)
Physical Therapy Evaluation Patient Details Name: [name] MRN: [id number] DOB: Dec 22, 1941 Today's Date: 08/07/2018   History of Present Illness  Pt s/p fall with L hip fx and hemi-arthroplasty repair.  Pt with hx of RA, MI, A-fib and L TKR  Clinical Impression  Pt s/p L hip hemi-arthroplasty and presents with functional mobility limitations 2* decreased L LE strength/ROM, post op pain, posterior THP and questionable cognition.  Pt would benefit from follow up rehab at SNF level to maximize IND and safety prior to return home.       Follow Up Recommendations SNF    Equipment Recommendations  None recommended by PT    Recommendations for Other Services       Precautions / Restrictions Precautions Precautions: Posterior Hip;Fall Restrictions Weight Bearing Restrictions: No LLE Weight Bearing: Weight bearing as tolerated      Mobility  Bed Mobility Overal bed mobility: Needs Assistance Bed Mobility: Supine to Sit     Supine to sit: Min assist;Mod assist;+2 for physical assistance;+2 for safety/equipment     General bed mobility comments: cues for sequence, use of R LE to self assist and adherence to THP  Transfers Overall transfer level: Needs assistance Equipment used: Rolling walker (2 wheeled) Transfers: Sit to/from Stand Sit to Stand: Min assist;Mod assist;+2 physical assistance;+2 safety/equipment;From elevated surface         General transfer comment: cues for LE management, use of UEs to self assist and adherence to THP  Ambulation/Gait Ambulation/Gait assistance: Min assist;Mod assist;+2 safety/equipment Gait Distance (Feet): 50 Feet Assistive device: Rolling walker (2 wheeled) Gait Pattern/deviations: Step-to pattern;Step-through pattern;Decreased step length - right;Decreased step length - left;Shuffle;Trunk flexed Gait velocity: decr   General Gait Details: cues for sequence, posture, position from RW and ER on L  Stairs             Wheelchair Mobility    Modified Rankin (Stroke Patients Only)       Balance Overall balance assessment: Needs assistance Sitting-balance support: Feet supported;No upper extremity supported Sitting balance-Leahy Scale: Fair     Standing balance support: Bilateral upper extremity supported Standing balance-Leahy Scale: Poor                               Pertinent Vitals/Pain Pain Assessment: 0-10 Pain Score: 4  Pain Location: L hip Pain Descriptors / Indicators: Aching;Sore Pain Intervention(s): Limited activity within patient's tolerance;Monitored during session;Premedicated before session;Ice applied    Home Living Family/patient expects to be discharged to:: Skilled nursing facility                      Prior Function Level of Independence: Independent with assistive device(s)         Comments: occasional use of SPC at least 40% of time per patient     Hand Dominance   Dominant Hand: Right    Extremity/Trunk Assessment   Upper Extremity Assessment Upper Extremity Assessment: Generalized weakness    Lower Extremity Assessment Lower Extremity Assessment: Generalized weakness;LLE deficits/detail LLE Deficits / Details: 2+/5 strength at hip with AAROM at hip to 80 flex and 20 abd       Communication   Communication: HOH  Cognition Arousal/Alertness: Awake/alert Behavior During Therapy: WFL for tasks assessed/performed;Impulsive Overall Cognitive Status: History of cognitive impairments - at baseline  General Comments      Exercises Total Joint Exercises Ankle Circles/Pumps: AROM;Both;15 reps;Supine Heel Slides: AAROM;Left;20 reps;Supine Hip ABduction/ADduction: AAROM;Left;15 reps;Supine   Assessment/Plan    PT Assessment Patient needs continued PT services  PT Problem List Decreased strength;Decreased range of motion;Decreased activity tolerance;Decreased  balance;Decreased mobility;Decreased knowledge of use of DME;Pain;Decreased knowledge of precautions       PT Treatment Interventions DME instruction;Gait training;Functional mobility training;Therapeutic activities;Therapeutic exercise;Patient/family education    PT Goals (Current goals can be found in the Care Plan section)  Acute Rehab PT Goals Patient Stated Goal: Regain IND PT Goal Formulation: With patient Time For Goal Achievement: 08/14/18 Potential to Achieve Goals: Good    Frequency 7X/week   Barriers to discharge        Co-evaluation               AM-PAC PT "6 Clicks" Daily Activity  Outcome Measure Difficulty turning over in bed (including adjusting bedclothes, sheets and blankets)?: Unable Difficulty moving from lying on back to sitting on the side of the bed? : Unable Difficulty sitting down on and standing up from a chair with arms (e.g., wheelchair, bedside commode, etc,.)?: Unable Help needed moving to and from a bed to chair (including a wheelchair)?: A Lot Help needed walking in hospital room?: A Lot Help needed climbing 3-5 steps with a railing? : A Lot 6 Click Score: 9    End of Session Equipment Utilized During Treatment: Gait belt Activity Tolerance: Patient tolerated treatment well Patient left: in chair;with call bell/phone within reach;with chair alarm set;with family/visitor present Nurse Communication: Mobility status PT Visit Diagnosis: Difficulty in walking, not elsewhere classified (R26.2)    Time: 0141-0301 PT Time Calculation (min) (ACUTE ONLY): 33 min   Charges:   PT Evaluation $PT Eval Low Complexity: 1 Low PT Treatments $Gait Training: 8-22 mins        Pg [phone]\  [name] 08/07/2018, 12:56 PM

## 2018-08-07 NOTE — Clinical Social Work Note (Addendum)
Clinical Social Work Assessment  Patient Details  Name: [name] MRN: [id number] Date of Birth: [birth year]  Date of referral:  08/07/18               Reason for consult:                   Permission sought to share information with:    Permission granted to share information::  Yes, Verbal Permission Granted  Name::      [name]   Agency::   SNF   Relationship::    Daughter/ Environmental manager Information:     Housing/Transportation Living arrangements for the past 2 months:  Single Family Home Source of Information:  Patient Patient Interpreter Needed:  None Criminal Activity/Legal Involvement Pertinent to Current Situation/Hospitalization:  No - Comment as needed Significant Relationships:  Other Family Members Lives with:  Self Do you feel safe going back to the place where you live?  No Need for family participation in patient care:  Yes (Comment)  Care giving concerns:   SNF placement for rehab.   Social Worker assessment / plan:  CSW met with patient and brother at beside, explained role and reason for visit to assist with discharge to SNF. Patient reports he was at facility in [facility] about six months ago. Patient is agreeable to go SNF. Patient and brother requested CSW to call patient daughter [name] to discuss discharge plan. CSW called patient daughter [name] to discuss the plan. Daughter prefers the patient go back to [facility] if space is available or go to another facility in the [place] area.    CSW called [facility] to determine bed availability.   fl2 completed.  PASRR.   Plan: SNF placement for rehab.  Ship broker initiated.   Employment status:    Insurance information:  Other (Comment Required) PT Recommendations:  [place] / Referral to community resources:  [place]  Patient/Family's Response to care:  Agreeable and Responding well to care.   Patient/Family's Understanding of and  Emotional Response to Diagnosis, Current Treatment, and Prognosis: Patient has basic understanding of his diagnosis and treatment. Patient deferred to daughter to assist with all discharge  planning.   Emotional Assessment Appearance:  Appears stated age Attitude/Demeanor/Rapport:    Affect (typically observed):  Accepting, Pleasant Orientation:  Oriented to Self, Oriented to Place, Oriented to Situation Alcohol / Substance use:  Not Applicable Psych involvement (Current and /or in the community):  No (Comment)  Discharge Needs  Concerns to be addressed:  Discharge Planning Concerns Readmission within the last 30 days:  No Current discharge risk:  Dependent with Mobility Barriers to Discharge:  Continued Medical Work up, [place], LCSW 08/07/2018, 1:01 PM

## 2018-08-07 NOTE — Progress Notes (Signed)
     Subjective: 1 Day Post-Op Procedure(s) (LRB): ARTHROPLASTY BIPOLAR HIP (HEMIARTHROPLASTY) (Left)   Patient resting comfortably in bed.  No reported events throughout the night. States that he know that he had surgery and discussed his dressing on the left hip.    Objective:   VITALS:   Vitals:   08/07/18 0106 08/07/18 0647  BP: (!) 141/45 136/81  Pulse: 62 (!) 57  Resp: 16 17  Temp: 98.3 F (36.8 C) 98.7 F (37.1 C)  SpO2: 94% 93%    Dorsiflexion/Plantar flexion intact Incision: dressing C/D/I No cellulitis present Compartment soft  LABS Recent Labs    08/05/18 1543 08/06/18 0521 08/07/18 0529  HGB 12.9* 12.7* 13.2  HCT 39.1 38.6* 39.5  WBC 11.3* 9.6 12.5*  PLT 226 239 220    Recent Labs    08/05/18 1009 08/05/18 1543 08/06/18 0521 08/07/18 0529  NA 140  --  142 138  K 4.0  --  3.9 3.8  BUN 12  --  12 17  CREATININE 0.88 0.84 0.75 0.90  GLUCOSE 102*  --  128* 146*     Assessment/Plan: 1 Day Post-Op Procedure(s) (LRB): ARTHROPLASTY BIPOLAR HIP (HEMIARTHROPLASTY) (Left)  Up with therapy Discharge disposition to be determined  Ortho recommendations:  ASA 81 mg bid for 4 weeks for anticoagulation, unless other medically indicated.  Norco for pain management (Rx written).  Robaxin for muscle spasms (Rx written).  MiraLax and Colace for constipation  Iron 325 mg tid for 2-3 weeks   WBAT on the left leg.  Dressing to remain in place until follow in clinic in 2 weeks.  Dressing is waterproof and may shower with it in place.  Follow up in 2 weeks at [facility]. Follow up with OLIN,Eliel Dudding D in 2 weeks.  Contact information:  [facility] [address] [place] Shaan Rhoads   PAC  08/07/2018, 9:15 AM

## 2018-08-07 NOTE — Progress Notes (Signed)
Physical Therapy Treatment Patient Details Name: [name] MRN: [id number] DOB: Dec 11, 1941 Today's Date: 08/07/2018    History of Present Illness Pt s/p fall with L hip fx and hemi-arthroplasty repair.  Pt with hx of RA, MI, A-fib and L TKR    PT Comments    Pt cooperative but with limited carry over from am session.  Pt asking same questions repeatedly and surprised by the answer each time.   Follow Up Recommendations  SNF     Equipment Recommendations  None recommended by PT    Recommendations for Other Services       Precautions / Restrictions Precautions Precautions: Posterior Hip;Fall Precaution Booklet Issued: Yes (comment) Precaution Comments: Pt recalls 1/3 THP without cues - all THP reviewed Restrictions Weight Bearing Restrictions: No LLE Weight Bearing: Weight bearing as tolerated    Mobility  Bed Mobility Overal bed mobility: Needs Assistance Bed Mobility: Supine to Sit     Supine to sit: Min assist;Mod assist;+2 for physical assistance;+2 for safety/equipment     General bed mobility comments: Pt request back to chair  Transfers Overall transfer level: Needs assistance Equipment used: Rolling walker (2 wheeled) Transfers: Sit to/from Stand Sit to Stand: Min assist;Mod assist;+2 physical assistance;+2 safety/equipment;From elevated surface         General transfer comment: cues for LE management, use of UEs to self assist and adherence to THP  Ambulation/Gait Ambulation/Gait assistance: Min assist;Mod assist;+2 safety/equipment Gait Distance (Feet): 85 Feet Assistive device: Rolling walker (2 wheeled) Gait Pattern/deviations: Step-to pattern;Step-through pattern;Decreased step length - right;Decreased step length - left;Shuffle;Trunk flexed Gait velocity: decr   General Gait Details: cues for sequence, posture, position from RW and ER on L   Stairs             Wheelchair Mobility    Modified Rankin (Stroke Patients Only)        Balance Overall balance assessment: Needs assistance Sitting-balance support: Feet supported;No upper extremity supported Sitting balance-Leahy Scale: Fair     Standing balance support: Bilateral upper extremity supported Standing balance-Leahy Scale: Poor                              Cognition Arousal/Alertness: Awake/alert Behavior During Therapy: WFL for tasks assessed/performed;Impulsive Overall Cognitive Status: History of cognitive impairments - at baseline                                        Exercises Total Joint Exercises Ankle Circles/Pumps: AROM;Both;15 reps;Supine Heel Slides: AAROM;Left;20 reps;Supine Hip ABduction/ADduction: AAROM;Left;15 reps;Supine    General Comments        Pertinent Vitals/Pain Pain Assessment: 0-10 Pain Score: 4  Pain Location: L hip Pain Descriptors / Indicators: Aching;Sore Pain Intervention(s): Limited activity within patient's tolerance;Monitored during session;Premedicated before session    Home Living Family/patient expects to be discharged to:: Skilled nursing facility                    Prior Function Level of Independence: Independent with assistive device(s)      Comments: occasional use of SPC at least 40% of time per patient   PT Goals (current goals can now be found in the care plan section) Acute Rehab PT Goals Patient Stated Goal: Regain IND PT Goal Formulation: With patient Time For Goal Achievement: 08/14/18 Potential to Achieve Goals: Good Progress towards  PT goals: Progressing toward goals    Frequency    7X/week      PT Plan Current plan remains appropriate    Co-evaluation              AM-PAC PT "6 Clicks" Daily Activity  Outcome Measure  Difficulty turning over in bed (including adjusting bedclothes, sheets and blankets)?: Unable Difficulty moving from lying on back to sitting on the side of the bed? : Unable Difficulty sitting down on and  standing up from a chair with arms (e.g., wheelchair, bedside commode, etc,.)?: Unable Help needed moving to and from a bed to chair (including a wheelchair)?: A Lot Help needed walking in hospital room?: A Lot Help needed climbing 3-5 steps with a railing? : A Lot 6 Click Score: 9    End of Session Equipment Utilized During Treatment: Gait belt Activity Tolerance: Patient tolerated treatment well Patient left: in chair;with call bell/phone within reach;with chair alarm set;with family/visitor present Nurse Communication: Mobility status PT Visit Diagnosis: Difficulty in walking, not elsewhere classified (R26.2)     Time: 3578-9784 PT Time Calculation (min) (ACUTE ONLY): 19 min  Charges:  $Gait Training: 8-22 mins                     Pg [phone]    Yasaman Kolek 08/07/2018, 3:10 PM

## 2018-08-08 ENCOUNTER — Non-Acute Institutional Stay (SKILLED_NURSING_FACILITY): Payer: Medicare PPO | Admitting: Internal Medicine

## 2018-08-08 ENCOUNTER — Encounter: Payer: Self-pay | Admitting: Internal Medicine

## 2018-08-08 ENCOUNTER — Inpatient Hospital Stay
Admission: RE | Admit: 2018-08-08 | Discharge: 2018-08-25 | Disposition: A | Discharge status: Home / Self Care | Payer: Medicare PPO | Source: Ambulatory Visit | Attending: Internal Medicine | Admitting: Internal Medicine

## 2018-08-08 DIAGNOSIS — S72002D Fracture of unspecified part of neck of left femur, subsequent encounter for closed fracture with routine healing: Secondary | ICD-10-CM | POA: Diagnosis not present

## 2018-08-08 DIAGNOSIS — M05742 Rheumatoid arthritis with rheumatoid factor of left hand without organ or systems involvement: Secondary | ICD-10-CM

## 2018-08-08 DIAGNOSIS — R001 Bradycardia, unspecified: Secondary | ICD-10-CM

## 2018-08-08 DIAGNOSIS — I4891 Unspecified atrial fibrillation: Secondary | ICD-10-CM

## 2018-08-08 DIAGNOSIS — I10 Essential (primary) hypertension: Secondary | ICD-10-CM

## 2018-08-08 DIAGNOSIS — R52 Pain, unspecified: Principal | ICD-10-CM

## 2018-08-08 DIAGNOSIS — E039 Hypothyroidism, unspecified: Secondary | ICD-10-CM

## 2018-08-08 DIAGNOSIS — M05741 Rheumatoid arthritis with rheumatoid factor of right hand without organ or systems involvement: Secondary | ICD-10-CM

## 2018-08-08 LAB — CBC
HEMATOCRIT: 37.1 % — AB (ref 39.0–52.0)
HEMOGLOBIN: 12.4 g/dL — AB (ref 13.0–17.0)
MCH: 34.4 pg — ABNORMAL HIGH (ref 26.0–34.0)
MCHC: 33.4 g/dL (ref 30.0–36.0)
MCV: 103.1 fL — ABNORMAL HIGH (ref 78.0–100.0)
Platelets: 222 10*3/uL (ref 150–400)
RBC: 3.6 MIL/uL — ABNORMAL LOW (ref 4.22–5.81)
RDW: 13.7 % (ref 11.5–15.5)
WBC: 14.5 10*3/uL — AB (ref 4.0–10.5)

## 2018-08-08 LAB — BASIC METABOLIC PANEL
ANION GAP: 8 (ref 5–15)
BUN: 20 mg/dL (ref 8–23)
CALCIUM: 8.1 mg/dL — AB (ref 8.9–10.3)
CO2: 28 mmol/L (ref 22–32)
Chloride: 102 mmol/L (ref 98–111)
Creatinine, Ser: 0.78 mg/dL (ref 0.61–1.24)
GFR calc non Af Amer: >60 mL/min (ref >60)
Glucose, Bld: 108 mg/dL — ABNORMAL HIGH (ref 70–99)
Potassium: 4.3 mmol/L (ref 3.5–5.1)
Sodium: 138 mmol/L (ref 135–145)

## 2018-08-08 NOTE — Progress Notes (Signed)
Location:    [place] Room Number: 151/P Place of Service:  SNF (31) Provider:  Granville Lewis PA-C  Caren Macadam, MD  Patient Care Team: Caren Macadam, MD as PCP - General (Family Medicine) Herminio Commons, MD as PCP - Cardiology (Cardiology) Gala Romney Cristopher Estimable, MD as Consulting Physician (Gastroenterology) Hennie Duos, MD as Consulting Physician (Rheumatology) Sanjuana Kava, MD as Consulting Physician (Orthopedic Surgery) Allyn Kenner, MD as Consulting Physician (Dermatology)  Extended Emergency Contact Information Primary Emergency Contact: [place], [place] [address] Johnnette Litter of [place] Phone: [phone] Mobile Phone: [phone] Relation: Daughter  Code Status: DNR  Goals of care: Advanced Directive information Advanced Directives 08/05/2018  Does Patient Have a Medical Advance Directive? Yes  Type of Advance Directive [place]  Does patient want to make changes to medical advance directive? No - Patient declined  Copy of [place] in Chart? No - copy requested  Would patient like information on creating a medical advance directive? -     Chief Complaint  Patient presents with  . Hospitalization Follow-up    Patient is being seen for hospitalization f/u  Hospitalized for left hip fracture with repair after sustaining a fall  HPI:  Pt is a 77 y.o. male seen today for a hospital f/u s/p admission for a left hip repair after a fall.  He has a history of atrial fibrillation not on anticoagulation because of a fall history- as well as a history of rheumatoid arthritis hypothyroidism glaucoma hypertension.  Apparently he fell at home and had left hip pain and found to have a left femoral neck fracture- orthopedics was consulted and recommended to be transferred to [facility] for surgery.  He had surgery done on August 13 by orthopedics- recommendation was for  skilled nursing placement for rehab.  Currently he does not really complain of pain he appears to be stable- he does appear somewhat confused but very pleasantly so.  Vital signs appear to be stable he does have chronic bradycardia pulse is in the 50s but this apparently has been stable and persistent for an extended period of time.  He does receive Norco as needed for pain as well as Robaxin-he is also on iron Spe[place] for postop anemia hemoglobin today was 12.4.  He is on aspirin twice daily for DVT prophylaxis.  He also was noted to have some leukocytosis white count was 14,500 today- this is thought to be a reactive cytosis status post surgery.  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  He also appears to have a history of systolic CHF with echo done in June showing an ejection fraction of 45-50%- he is on Lasix 40 mg a day.  Note per previous admission to this facility he did have recommendations for magnesium to keep it at around 2 or above-he is on supplementation  Again he is not currently complaining of pain or issues at this time   Past Medical History:  Diagnosis Date  . A-fib ([place])   . Arthritis    osteoarthritis  . Basal cell carcinoma (BCC) 08/29/2017  . Cataract   . Frequent falls   . Glaucoma   . High cholesterol   . Hypertension   . MI (myocardial infarction) ([place])   . Rheumatoid arthritis ([place])   . Thyroid disease    Past Surgical History:  Procedure Laterality Date  . CORONARY ANGIOPLASTY WITH STENT PLACEMENT    . HIP ARTHROPLASTY Left 08/06/2018   Procedure: ARTHROPLASTY BIPOLAR HIP (HEMIARTHROPLASTY);  Surgeon: Paralee Cancel, MD;  Location: [facility];  Service: Orthopedics;  Laterality: Left;  . JOINT REPLACEMENT     left knee  . REPLACEMENT TOTAL KNEE Left     Allergies  Allergen Reactions  . Metoprolol     Prescribed for A. Fib 12/17-12/22/18 nocturnal heart rates in the high 30s and 40s, beta  blocker discontinued    Allergies as of 08/08/2018      Reactions   Metoprolol    Prescribed for A. Fib 12/17-12/22/18 nocturnal heart rates in the high 30s and 40s, beta blocker discontinued      Medication List        Accurate as of 08/08/18  3:04 PM. Always use your most recent med list.          acetaminophen 325 MG tablet Commonly known as:  TYLENOL Take 650 mg by mouth every 6 (six) hours as needed.   aspirin EC 81 MG tablet Take 81 mg by mouth daily. Starting 09/06/2018   aspirin 81 MG chewable tablet Chew 1 tablet (81 mg total) by mouth 2 (two) times daily. Take for 4 weeks, then resume regular dose.   cyanocobalamin 1000 MCG tablet Take 1,000 mcg by mouth daily.   docusate sodium 100 MG capsule Commonly known as:  COLACE Take 1 capsule (100 mg total) by mouth 2 (two) times daily.   donepezil 10 MG tablet Commonly known as:  ARICEPT Take 1 tablet (10 mg total) by mouth at bedtime.   ferrous sulfate 325 (65 FE) MG tablet Take 1 tablet (325 mg total) by mouth 2 (two) times daily with a meal.   folic acid 1 MG tablet Commonly known as:  FOLVITE TAKE 1 TABLET BY MOUTH ONCE DAILY   furosemide 40 MG tablet Commonly known as:  LASIX Take 40 mg by mouth daily.   HYDROcodone-acetaminophen 5-325 MG tablet Commonly known as:  NORCO/VICODIN Take 1-2 tablets by mouth every 4 (four) hours as needed for moderate pain or severe pain.   levothyroxine 75 MCG tablet Commonly known as:  SYNTHROID, LEVOTHROID TAKE 1 TABLET BY MOUTH ONCE DAILY BEFORE BREAKFAST   Magnesium 200 MG Tabs Take 400 mg by mouth daily.   Melatonin 3 MG Tabs Take 3 mg by mouth at bedtime.   methocarbamol 500 MG tablet Commonly known as:  ROBAXIN Take 1 tablet (500 mg total) by mouth every 6 (six) hours as needed for muscle spasms.   methotrexate 2.5 MG tablet Take 15 mg by mouth every Sunday. 6 tablets on Sunday   NASACORT ALLERGY 24HR 55 MCG/ACT Aero nasal inhaler Generic drug:   triamcinolone Place 1 spray into the nose daily.   polycarbophil 625 MG tablet Commonly known as:  FIBERCON Take 625 mg by mouth at bedtime.   polyethylene glycol packet Commonly known as:  MIRALAX / GLYCOLAX Take 17 g by mouth 2 (two) times daily.   potassium chloride SA 20 MEQ tablet Commonly known as:  K-DUR,KLOR-CON Take 1 tablet (20 mEq total) by mouth 2 (two) times daily.   simvastatin 40 MG tablet Commonly known as:  ZOCOR TAKE 1 TABLET BY MOUTH ONCE DAILY       Review of Systems   This may be limited secondary to some element of dementia.  In general is not complaining of any fever or chills.  Skin does not  complain of rashes or itching surgical site left hip is currently covered  Head ears eyes nose mouth and throat-is not complaining of any sore throat or visual changes he has prescription lenses.  Respiratory is not complaining of shortness of breath or cough.  Cardiac does not complain of chest pain or palpitations appears he has some chronic bradycardia.  GI is not complaining of abdominal pain nausea vomiting diarrhea or constipation.  GU is not complaining of dysuria.  Muscular skeletal at this point denies joint pain at times says he will have some hip discomfort he does have an order for Norco as well as Robaxin.  Neurologic is not complaining of dizziness headache or numbness.  Psych does not complain of being depressed or anxious    Immunization History  Administered Date(s) Administered  . Influenza Whole 08/07/2016  . Influenza,inj,Quad PF,6+ Mos 09/21/2017  . Pneumococcal Conjugate-13 11/27/2017  . Tdap 05/18/2017  . Zoster 12/10/2014   Pertinent  Health Maintenance Due  Topic Date Due  . INFLUENZA VACCINE  07/25/2018  . PNA vac Low Risk Adult (2 of 2 - PPSV23) 11/27/2018   Fall Risk  01/01/2018 11/27/2017 07/10/2017 04/30/2017  Falls in the past year? Yes Yes Yes Yes  Number falls in past yr: 2 or more 2 or more 2 or more 2 or more    Injury with Fall? Yes Yes Yes Yes  Risk Factor Category  - High Fall Risk - -  Risk for fall due to : - Impaired balance/gait History of fall(s) -  Follow up - - Education provided -   Functional Status Survey:    Vitals:   08/08/18 1454  BP: 126/72  Pulse: (!) 57  Resp: 20  Temp: (!) 97.3 F (36.3 C)  TempSrc: Oral    Physical Exam   General this is a pleasant elderly male in no distress.  Skin is warm and dry he does have covering over her left hip surgical site.  Eyes visual acuity appears grossly intact sclera and conjunctive are clear.  Is clear mucous membranes moist.  Chest is clear to auscultation there is no labored breathing.  Heart is regular irregular rhythm slightly bradycardic which is not unusual-he does not appear to have significant lower extremity edema pedal pulses are palpable  Abdomen is soft nontender with positive bowel sounds.  Musculoskeletal moves upper extremities at baseline- limited left lower extremity status post surgery has slight outward rotation  Neurologic appears grossly intact his speech is clear no lateralizing findings   Psych-was able to give a fairly accurate history but does have some confusion.  Was able to talk about his son being a Careers adviser and grandsons playing football- appear to be an accurate historian in that regards-.  Was able to talk sports--did seem confused sometimes about other matters- does speak somewhat slowly   Labs reviewed: Recent Labs    12/16/17 0315  12/19/17 1051 12/25/17 0430  08/06/18 0521 08/07/18 0529 08/08/18 0541  NA 137   < >  --  137   < > 142 138 138  K 3.5   < >  --  5.0   < > 3.9 3.8 4.3  CL 99*   < >  --  101   < > 106 102 102  CO2 29   < >  --  27   < > 28 28 28   GLUCOSE 95   < >  --  99   < > 128* 146* 108*  BUN 15   < >  --  11   < > 12 17 20   CREATININE 0.77   < >  --  0.89   < > 0.75 0.90 0.78  CALCIUM 8.3*   < >  --  8.6*   < > 8.6* 8.6* 8.1*  MG 1.7  --  1.8 2.0   --   --   --   --    < > = values in this interval not displayed.   Recent Labs    12/11/17 1317 01/30/18 1101  PROT  --  6.6  ALBUMIN 2.2*  --    Recent Labs    12/19/17 1014 12/25/17 0430 08/05/18 1009  08/06/18 0521 08/07/18 0529 08/08/18 0541  WBC 9.7 8.0 11.0*   < > 9.6 12.5* 14.5*  NEUTROABS 6.7 4.2 8.0*  --   --   --   --   HGB 12.0* 11.3* 13.8   < > 12.7* 13.2 12.4*  HCT 38.1* 36.0* 41.2   < > 38.6* 39.5 37.1*  MCV 101.1* 100.0 103.5*   < > 102.4* 102.1* 103.1*  PLT 422* 227 231   < > 239 220 222   < > = values in this interval not displayed.   Lab Results  Component Value Date   TSH 2.791 12/14/2017   No results found for: HGBA1C Lab Results  Component Value Date   CHOL 94 04/30/2017   HDL 31 (L) 04/30/2017   LDLCALC 48 04/30/2017   TRIG 73 04/30/2017   CHOLHDL 3.0 04/30/2017    Significant Diagnostic Results in last 30 days:  Dg Chest 1 View  Result Date: 08/05/2018 CLINICAL DATA:  Atrial fibrillation EXAM: CHEST  1 VIEW COMPARISON:  December 10, 2017 FINDINGS: There is no edema or consolidation. There is cardiomegaly with pulmonary vascularity normal. There is aortic atherosclerosis. There is deviation of the upper thoracic trachea to the left. There is degenerative change in the right shoulder with superior migration of the right humeral head. IMPRESSION: 1. Cardiomegaly. No edema or consolidation. Aortic atherosclerosis. 2. Shift of the upper thoracic trachea to the left. This finding was not present on prior study. Question localized thyroid enlargement in this area. 3. 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 Aortic Atherosclerosis (ICD10-I70.0). Electronically Signed   By: Lowella Grip III M.D.   On: 08/05/2018 10:04   Pelvis Portable  Result Date: 08/06/2018 CLINICAL DATA:  Postop left hip replacement EXAM: PORTABLE PELVIS 1-2 VIEWS COMPARISON:  None. FINDINGS: Left hip hemiarthroplasty with  associated soft tissue gas. Alignment is normal. No periprosthetic abnormality. IMPRESSION: Expected postoperative appearance of left hip hemiarthroplasty. Electronically Signed   By: Ulyses Jarred M.D.   On: 08/06/2018 16:39   Dg Hip Unilat W Or Wo Pelvis 2-3 Views Left  Result Date: 08/05/2018 CLINICAL DATA:  Status post fall, hip pain EXAM: DG HIP (WITH OR WITHOUT PELVIS) 2-3V LEFT COMPARISON:  None. FINDINGS: Generalized osteopenia. Mildly displaced and angulated left femoral neck fracture. No other fracture or dislocation. Peripheral vascular atherosclerotic disease. IMPRESSION: Mildly displaced and angulated left femoral neck fracture. Electronically Signed   By: Kathreen Devoid   On: 08/05/2018 10:05    Assessment/Plan  #1 history of left femoral fracture status post repair- he will need PT and OT-he is on aspirin twice daily for prophylaxis x1 month- he has Norco or Robaxin as needed.  At this point continue to monitor  2.  History of anemia suspected element of postop he is on iron hemoglobin was 12.  On lab done today will monitor periodically  #3- history leukocytosis again this was thought to be reactive will update this tomorrow keep an eye on it he is not complaining of any dysuria or cough or congestion fever chills  #4-history of atrial fibrillation he is not on a beta-blocker he has chronic bradycardia however is not new-he is not on aggressive anticoagulation because of fall history  #5- history of rheumatoid arthritis he continues on methotrexate  6.  History of hypothyroidism he is on Synthroid- will update a TSH TSH back in December was 2.791   #7 history of dementia this appears to be mild/moderate he is on Aricept continue supportive care.  8.-  History of hypertension he is only on Lasix- at this point appears stable we have minimal readings however.  9.  History of systolic CHF ejection fraction 4550% on recent echo done in [place] he is on Lasix at this point  monitor he does not appear to have significant lower extremity edema appears to be stable in this regards.  10.-History of hyperlipidemia he does continue on a statin.  11.  History of insomnia he is on a short course of melatonin  Again patient was here previously I see a recommendation to try to keep magnesium 2 or above- last magnesium level I see was 1.7 but this was done sometime ago- will update a magnesium level tomorrow in addition to the CBC metabolic panel and TSH.  [id number]- of note greater than 40 minutes spent assessing patient-reviewing his chart and labs- and coordinating and formulating a plan of care for numerous diagnoses of note greater than 50% of time spent coordinating a plan of care

## 2018-08-08 NOTE — Progress Notes (Signed)
Patient ID: [name], male   DOB: 07-17-1941, 77 y.o.   MRN: [id number] Subjective: 2 Days Post-Op Procedure(s) (LRB): ARTHROPLASTY BIPOLAR HIP (HEMIARTHROPLASTY) (Left)    Patient reports pain as mild. Up using bedside commode today Plan for d/c to [facility] center rehab Pleasantly confused with h/o early dementia  Objective:   VITALS:   Vitals:   08/08/18 0505 08/08/18 0508  BP: 131/65   Pulse: (!) 47 (!) 54  Resp: 17   Temp: 97.8 F (36.6 C)   SpO2:  95%    Neurovascular intact Incision: dressing C/D/I  LABS Recent Labs    08/06/18 0521 08/07/18 0529 08/08/18 0541  HGB 12.7* 13.2 12.4*  HCT 38.6* 39.5 37.1*  WBC 9.6 12.5* 14.5*  PLT 239 220 222    Recent Labs    08/06/18 0521 08/07/18 0529 08/08/18 0541  NA 142 138 138  K 3.9 3.8 4.3  BUN 12 17 20   CREATININE 0.75 0.90 0.78  GLUCOSE 128* 146* 108*    No results for input(s): LABPT, INR in the last 72 hours.   Assessment/Plan: 2 Days Post-Op Procedure(s) (LRB): ARTHROPLASTY BIPOLAR HIP (HEMIARTHROPLASTY) (Left)   Advance diet Up with therapy Discharge to SNF   RTC in 2 weeks WBAT LLE

## 2018-08-08 NOTE — Clinical Social Work Placement (Signed)
Insurance approval received 8/15 Authorization number [id number] start 8/15-8/17 543 minutes/RVC   CLINICAL SOCIAL WORK PLACEMENT  NOTE  Date:  08/08/2018  Patient Details  Name: [name] MRN: [id number] Date of Birth: 1941/06/04  Clinical Social Work is seeking post-discharge placement for this patient at the [place] level of care (*CSW will initial, date and re-position this form in  chart as items are completed):  Yes   Patient/family provided with [facility] Work Department's list of facilities offering this level of care within the geographic area requested by the patient (or if unable, by the patient's family).  Yes   Patient/family informed of their freedom to choose among providers that offer the needed level of care, that participate in Medicare, Medicaid or managed care program needed by the patient, have an available bed and are willing to accept the patient.  Yes   Patient/family informed of [facility]'s ownership interest in [facility] and [facility], as well as of the fact that they are under no obligation to receive care at these facilities.  PASRR submitted to EDS on       PASRR number received on       Existing PASRR number confirmed on 08/07/18     FL2 transmitted to all facilities in geographic area requested by pt/family on       FL2 transmitted to all facilities within larger geographic area on 08/07/18     Patient informed that his/her managed care company has contracts with or will negotiate with certain facilities, including the following:  [facility]     Yes   Patient/family informed of bed offers received.  Patient chooses bed at [facility]     Physician recommends and patient chooses bed at      Patient to be transferred to [facility] on 08/08/18.  Patient to be transferred to facility by [facility]     Patient family notified on 08/08/18 of transfer.  Name of family member  notified:  Daughter Magda Paganini     PHYSICIAN Please prepare priority discharge summary, including medications, Please sign FL2     Additional Comment:    _______________________________________________ Lia Hopping, LCSW 08/08/2018, 8:32 AM

## 2018-08-08 NOTE — Progress Notes (Signed)
Patient confused at times. Patient had low PO intake overnight. Patient's urine output 125 ml at 3:06. Bladder scans were <140 for the shift. Vitals stable.

## 2018-08-08 NOTE — Discharge Summary (Signed)
Physician Discharge Summary  [name] WER:[id number] DOB: 1941-09-13 DOA: 08/05/2018  PCP: Caren Macadam, MD  Admit date: 08/05/2018 Discharge date: 08/08/2018  Admitted From: Home Disposition:  SNF  Recommendations for Outpatient Follow-up:  1. Follow up with PCP in 1-2 weeks 2. follow-up with orthopedics as an outpatient 3. Activity as per orthopedic/PT recommendations 4. Wound care as per orthopedics recommendations 5. Fall precautions 6. Outpatient follow-up with neurology as scheduled   Home Health: No  Equipment/Devices:none Discharge Condition: Stable  CODE STATUS: Full Diet recommendation: Heart Healthy   Brief/Interim Summary: 77 year old male with history of atrial fibrillation not on anticoagulation due to history of frequent falls, rheumatoid arthritis, hypothyroidism, glaucoma, hypertension presented 08/05/2018 with fall with left hip pain.  He was found to have left femoral neck fracture.  Orthopedics was consulted who recommended that the patient be transferred to [facility].  Patient had surgical intervention on 08/06/2018 by orthopedics.  Patient was evaluated by PT and recommended nursing home placement.  he will be discharged to nursing home once bed is available.  Discharge Diagnoses:  Principal Problem:   Closed left hip fracture ([place]) Active Problems:   A-fib ([facility])   Essential hypertension   Hypothyroid   HLD (hyperlipidemia)  Closed left femoral neck fracture after a mechanical fall -Status post left hip hemiarthroplasty on 08/06/2018 by orthopedics.  Wound care as per orthopedics recommendations.  Patient tolerated physical therapy who recommended nursing home placement.  Discharge to nursing home was bed is available.  Outpatient follow-up with orthopedics.  Continue pain management with bowel regimen. -Fall precautions -DVT prophylaxis as per orthopedics  Leukocytosis -Probably reactive  Atrial fibrillation with bradycardia -Heart  rate is probably at baseline.  Not on any rate controlling medications.  Not on anticoagulation because of history of frequent falls.  Hypothyroidism -Continue Synthroid  Hyperlipidemia -Continue statin  Hypertension -Blood pressure controlled.  Continue Lasix  Rheumatoid arthritis -Continue methotrexate  Dementia -Continue donepezil.  Monitor mental status.  Outpatient follow-up with neurology.  Discharge Instructions  Discharge Instructions    Call MD for:  difficulty breathing, headache or visual disturbances   Complete by:  As directed    Call MD for:  extreme fatigue   Complete by:  As directed    Call MD for:  hives   Complete by:  As directed    Call MD for:  persistant dizziness or light-headedness   Complete by:  As directed    Call MD for:  persistant nausea and vomiting   Complete by:  As directed    Call MD for:  redness, tenderness, or signs of infection (pain, swelling, redness, odor or green/yellow discharge around incision site)   Complete by:  As directed    Call MD for:  severe uncontrolled pain   Complete by:  As directed    Call MD for:  temperature >100.4   Complete by:  As directed    Diet - low sodium heart healthy   Complete by:  As directed    Discharge instructions   Complete by:  As directed    Fall precautions Activity as per orthopedic/PT recommendations Wound care as per orthopedic recommendations   Increase activity slowly   Complete by:  As directed    Weight bearing as tolerated   Complete by:  As directed    Laterality:  left   Extremity:  Lower     Allergies as of 08/08/2018      Reactions   Metoprolol  Prescribed for A. Fib 12/17-12/22/18 nocturnal heart rates in the high 30s and 40s, beta blocker discontinued      Medication List    STOP taking these medications   aspirin EC 81 MG tablet Replaced by:  aspirin 81 MG chewable tablet   NON FORMULARY     TAKE these medications   acetaminophen 325 MG  tablet Commonly known as:  TYLENOL Take 650 mg by mouth every 6 (six) hours as needed.   aspirin 81 MG chewable tablet Chew 1 tablet (81 mg total) by mouth 2 (two) times daily. Take for 4 weeks, then resume regular dose. Replaces:  aspirin EC 81 MG tablet   cyanocobalamin 1000 MCG tablet Take 1,000 mcg by mouth daily.   docusate sodium 100 MG capsule Commonly known as:  COLACE Take 1 capsule (100 mg total) by mouth 2 (two) times daily.   donepezil 10 MG tablet Commonly known as:  ARICEPT Take 1 tablet (10 mg total) by mouth at bedtime.   ferrous sulfate 325 (65 FE) MG tablet Take 1 tablet (325 mg total) by mouth 2 (two) times daily with a meal.   folic acid 1 MG tablet Commonly known as:  FOLVITE TAKE 1 TABLET BY MOUTH ONCE DAILY   furosemide 40 MG tablet Commonly known as:  LASIX Take 1 tablet (40 mg total) by mouth daily.   HYDROcodone-acetaminophen 5-325 MG tablet Commonly known as:  NORCO/VICODIN Take 1-2 tablets by mouth every 4 (four) hours as needed for moderate pain or severe pain.   levothyroxine 75 MCG tablet Commonly known as:  SYNTHROID, LEVOTHROID TAKE 1 TABLET BY MOUTH ONCE DAILY BEFORE BREAKFAST   Magnesium 200 MG Tabs Take 400 mg by mouth daily.   Melatonin 3 MG Tabs Take 3 mg by mouth at bedtime.   methocarbamol 500 MG tablet Commonly known as:  ROBAXIN Take 1 tablet (500 mg total) by mouth every 6 (six) hours as needed for muscle spasms.   methotrexate 2.5 MG tablet Take 15 mg by mouth every Sunday. 6 tablets on Sunday   NASACORT ALLERGY 24HR 55 MCG/ACT Aero nasal inhaler Generic drug:  triamcinolone Place 1 spray into the nose daily.   polycarbophil 625 MG tablet Commonly known as:  FIBERCON Take 625 mg by mouth at bedtime.   polyethylene glycol packet Commonly known as:  MIRALAX / GLYCOLAX Take 17 g by mouth 2 (two) times daily.   potassium chloride SA 20 MEQ tablet Commonly known as:  K-DUR,KLOR-CON Take 1 tablet (20 mEq total) by  mouth 2 (two) times daily.   simvastatin 40 MG tablet Commonly known as:  ZOCOR TAKE 1 TABLET BY MOUTH ONCE DAILY   timolol 0.5 % ophthalmic solution Commonly known as:  TIMOPTIC INSTILL ONE DROP INTO EACH EYE TWICE DAILY            Discharge Care Instructions  (From admission, onward)         Start     Ordered   08/06/18 0000  Weight bearing as tolerated    Question Answer Comment  Laterality left   Extremity Lower      08 /13/19 1406         Follow-up Information    Paralee Cancel, MD. Schedule an appointment as soon as possible for a visit in 2 weeks.   Specialty:  Orthopedic Surgery Contact information: [address] [place] [address] [phone]        Caren Macadam, MD. Schedule an appointment as soon as  possible for a visit in 1 week(s).   Specialty:  Family Medicine Contact information: [address] [place] [address] [phone]        Herminio Commons, MD .   Specialty:  Cardiology Contact information: [place] [address] [phone]          Allergies  Allergen Reactions  . Metoprolol     Prescribed for A. Fib 12/17-12/22/18 nocturnal heart rates in the high 30s and 40s, beta blocker discontinued    Consultations:  Orthopedics   Procedures/Studies: Dg Chest 1 View  Result Date: 08/05/2018 CLINICAL DATA:  Atrial fibrillation EXAM: CHEST  1 VIEW COMPARISON:  December 10, 2017 FINDINGS: There is no edema or consolidation. There is cardiomegaly with pulmonary vascularity normal. There is aortic atherosclerosis. There is deviation of the upper thoracic trachea to the left. There is degenerative change in the right shoulder with superior migration of the right humeral head. IMPRESSION: 1. Cardiomegaly. No edema or consolidation. Aortic atherosclerosis. 2. Shift of the upper thoracic trachea to the left. This finding was not present on prior study. Question localized thyroid  enlargement in this area. 3. 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 Aortic Atherosclerosis (ICD10-I70.0). Electronically Signed   By: Lowella Grip III M.D.   On: 08/05/2018 10:04   Pelvis Portable  Result Date: 08/06/2018 CLINICAL DATA:  Postop left hip replacement EXAM: PORTABLE PELVIS 1-2 VIEWS COMPARISON:  None. FINDINGS: Left hip hemiarthroplasty with associated soft tissue gas. Alignment is normal. No periprosthetic abnormality. IMPRESSION: Expected postoperative appearance of left hip hemiarthroplasty. Electronically Signed   By: Ulyses Jarred M.D.   On: 08/06/2018 16:39   Dg Hip Unilat W Or Wo Pelvis 2-3 Views Left  Result Date: 08/05/2018 CLINICAL DATA:  Status post fall, hip pain EXAM: DG HIP (WITH OR WITHOUT PELVIS) 2-3V LEFT COMPARISON:  None. FINDINGS: Generalized osteopenia. Mildly displaced and angulated left femoral neck fracture. No other fracture or dislocation. Peripheral vascular atherosclerotic disease. IMPRESSION: Mildly displaced and angulated left femoral neck fracture. Electronically Signed   By: Kathreen Devoid   On: 08/05/2018 10:05    Hip hemiarthroplasty on 08/06/2018   Subjective: Patient seen and examined at bedside.  He is awake but lightly confused.  Spoke to daughter at bedside.  No overnight fever, nausea or vomiting.  Discharge Exam: Vitals:   08/08/18 0505 08/08/18 0508  BP: 131/65   Pulse: (!) 47 (!) 54  Resp: 17   Temp: 97.8 F (36.6 C)   SpO2:  95%   Vitals:   08/07/18 1352 08/07/18 2102 08/08/18 0505 08/08/18 0508  BP: 130/67 (!) 142/63 131/65   Pulse: (!) 48 (!) 49 (!) 47 (!) 54  Resp: 16 16 17    Temp: 98.2 F (36.8 C) 98.6 F (37 C) 97.8 F (36.6 C)   TempSrc: Oral Oral Oral   SpO2: 97% 96%  95%  Weight:      Height:        General: Pt is awake but slightly confused, not in acute distress Cardiovascular: rate controlled, S1/S2 + Respiratory: bilateral decreased breath  sounds at bases Abdominal: Soft, NT, ND, bowel sounds + Extremities: no edema, no cyanosis    The results of significant diagnostics from this hospitalization (including imaging, microbiology, ancillary and laboratory) are listed below for reference.     Microbiology: No results found for this or any previous visit (from the past 240 hour(s)).  Labs: BNP (last 3 results) Recent Labs    12/10/17 0957 12/25/17 0430  BNP 258.0* 099.8*   Basic Metabolic Panel: Recent Labs  Lab 08/05/18 1009 08/05/18 1543 08/06/18 0521 08/07/18 0529 08/08/18 0541  NA 140  --  142 138 138  K 4.0  --  3.9 3.8 4.3  CL 103  --  106 102 102  CO2 31  --  28 28 28   GLUCOSE 102*  --  128* 146* 108*  BUN 12  --  12 17 20   CREATININE 0.88 0.84 0.75 0.90 0.78  CALCIUM 9.0  --  8.6* 8.6* 8.1*   Liver Function Tests: No results for input(s): AST, ALT, ALKPHOS, BILITOT, PROT, ALBUMIN in the last 168 hours. No results for input(s): LIPASE, AMYLASE in the last 168 hours. No results for input(s): AMMONIA in the last 168 hours. CBC: Recent Labs  Lab 08/05/18 1009 08/05/18 1543 08/06/18 0521 08/07/18 0529 08/08/18 0541  WBC 11.0* 11.3* 9.6 12.5* 14.5*  NEUTROABS 8.0*  --   --   --   --   HGB 13.8 12.9* 12.7* 13.2 12.4*  HCT 41.2 39.1 38.6* 39.5 37.1*  MCV 103.5* 103.2* 102.4* 102.1* 103.1*  PLT 231 226 239 220 222   Cardiac Enzymes: No results for input(s): CKTOTAL, CKMB, CKMBINDEX, TROPONINI in the last 168 hours. BNP: Invalid input(s): POCBNP CBG: No results for input(s): GLUCAP in the last 168 hours. D-Dimer No results for input(s): DDIMER in the last 72 hours. Hgb A1c No results for input(s): HGBA1C in the last 72 hours. Lipid Profile No results for input(s): CHOL, HDL, LDLCALC, TRIG, CHOLHDL, LDLDIRECT in the last 72 hours. Thyroid function studies No results for input(s): TSH, T4TOTAL, T3FREE, THYROIDAB in the last 72 hours.  Invalid input(s): FREET3 Anemia work up No results  for input(s): VITAMINB12, FOLATE, FERRITIN, TIBC, IRON, RETICCTPCT in the last 72 hours. Urinalysis    Component Value Date/Time   COLORURINE AMBER (A) 12/10/2017 1638   APPEARANCEUR HAZY (A) 12/10/2017 1638   LABSPEC 1.019 12/10/2017 1638   PHURINE 5.0 12/10/2017 1638   GLUCOSEU NEGATIVE 12/10/2017 1638   HGBUR NEGATIVE 12/10/2017 [place] 12/10/2017 1638   KETONESUR NEGATIVE 12/10/2017 1638   PROTEINUR NEGATIVE 12/10/2017 1638   NITRITE NEGATIVE 12/10/2017 1638   LEUKOCYTESUR NEGATIVE 12/10/2017 1638   Sepsis Labs Invalid input(s): PROCALCITONIN,  WBC,  LACTICIDVEN Microbiology No results found for this or any previous visit (from the past 240 hour(s)).   Time coordinating discharge: 35 minutes  SIGNED:   Aline August, MD  [facility] 08/08/2018, 9:53 AM Pager: [phone]  If 7PM-7AM, please contact night-coverage [email or web address] Password [email or web address]

## 2018-08-08 NOTE — Progress Notes (Signed)
Physical Therapy Treatment Patient Details Name: [name] MRN: [id number] DOB: 1941-11-05 Today's Date: 08/08/2018    History of Present Illness Pt s/p fall with L hip fx and hemi-arthroplasty repair.  Pt with hx of RA, MI, A-fib and L TKR    PT Comments    Pt continues very cooperative but impulsive and with limited carry over of THP.  Pt assisted with dressing following THP and ambulated increased distance in hall.  Reviewed THP x 2 and with pts dtr and ambulance transport.   Follow Up Recommendations  SNF     Equipment Recommendations  None recommended by PT    Recommendations for Other Services       Precautions / Restrictions Precautions Precautions: Posterior Hip;Fall Precaution Booklet Issued: Yes (comment) Precaution Comments: Pt recalls 1/3 THP without cues - all THP reviewed Restrictions Weight Bearing Restrictions: No LLE Weight Bearing: Weight bearing as tolerated    Mobility  Bed Mobility Overal bed mobility: Needs Assistance Bed Mobility: Supine to Sit     Supine to sit: Min assist;Mod assist;+2 for physical assistance;+2 for safety/equipment     General bed mobility comments: cues for sequence, adherence to THP, and use of R LE to self assist  Transfers Overall transfer level: Needs assistance Equipment used: Rolling walker (2 wheeled) Transfers: Sit to/from Stand Sit to Stand: Min assist;Mod assist;+2 physical assistance;+2 safety/equipment;From elevated surface         General transfer comment: cues for LE management, use of UEs to self assist and adherence to THP  Ambulation/Gait Ambulation/Gait assistance: Min assist;+2 safety/equipment Gait Distance (Feet): 200 Feet Assistive device: Rolling walker (2 wheeled) Gait Pattern/deviations: Step-to pattern;Step-through pattern;Decreased step length - right;Decreased step length - left;Shuffle;Trunk flexed Gait velocity: decr   General Gait Details: cues for sequence, posture, position  from RW and ER on L   Stairs             Wheelchair Mobility    Modified Rankin (Stroke Patients Only)       Balance Overall balance assessment: Needs assistance Sitting-balance support: Feet supported;No upper extremity supported Sitting balance-Leahy Scale: Good     Standing balance support: Bilateral upper extremity supported Standing balance-Leahy Scale: Poor                              Cognition Arousal/Alertness: Awake/alert Behavior During Therapy: WFL for tasks assessed/performed;Impulsive Overall Cognitive Status: History of cognitive impairments - at baseline                                        Exercises      General Comments        Pertinent Vitals/Pain Pain Assessment: 0-10 Pain Score: 4  Pain Location: L hip Pain Descriptors / Indicators: Aching;Sore Pain Intervention(s): Limited activity within patient's tolerance;Monitored during session;Premedicated before session;Ice applied    Home Living                      Prior Function            PT Goals (current goals can now be found in the care plan section) Acute Rehab PT Goals Patient Stated Goal: Regain IND PT Goal Formulation: With patient Time For Goal Achievement: 08/14/18 Potential to Achieve Goals: Good Progress towards PT goals: Progressing toward goals    Frequency  7X/week      PT Plan Current plan remains appropriate    Co-evaluation              AM-PAC PT "6 Clicks" Daily Activity  Outcome Measure  Difficulty turning over in bed (including adjusting bedclothes, sheets and blankets)?: Unable Difficulty moving from lying on back to sitting on the side of the bed? : Unable Difficulty sitting down on and standing up from a chair with arms (e.g., wheelchair, bedside commode, etc,.)?: Unable Help needed moving to and from a bed to chair (including a wheelchair)?: A Lot Help needed walking in hospital room?: A Lot Help  needed climbing 3-5 steps with a railing? : A Lot 6 Click Score: 9    End of Session Equipment Utilized During Treatment: Gait belt Activity Tolerance: Patient tolerated treatment well Patient left: in chair;with call bell/phone within reach;with chair alarm set;with family/visitor present Nurse Communication: Mobility status PT Visit Diagnosis: Difficulty in walking, not elsewhere classified (R26.2)     Time: 6761-9509 PT Time Calculation (min) (ACUTE ONLY): 26 min  Charges:  $Gait Training: 8-22 mins $Therapeutic Activity: 8-22 mins                     Pg [phone]    [name] 08/08/2018, 12:21 PM

## 2018-08-08 NOTE — Care Management Important Message (Signed)
Important Message  Patient Details  Name: [name] MRN: [id number] Date of Birth: 1941-06-01   Medicare Important Message Given:  Yes    Kerin Salen 08/08/2018, 10:55 AMImportant Message  Patient Details  Name: [name] MRN: [id number] Date of Birth: 05-01-1941   Medicare Important Message Given:  Yes    Kerin Salen 08/08/2018, 10:55 AM

## 2018-08-08 NOTE — Progress Notes (Signed)
Report called to [facility].  Pt transported by EMS in stable condition.

## 2018-08-08 NOTE — Progress Notes (Signed)
Bladder scan 291 at 6:50 a.m.

## 2018-08-09 ENCOUNTER — Other Ambulatory Visit: Payer: Self-pay

## 2018-08-09 ENCOUNTER — Encounter (HOSPITAL_COMMUNITY)
Admission: RE | Admit: 2018-08-09 | Discharge: 2018-08-09 | Disposition: A | Discharge status: Home / Self Care | Payer: Medicare PPO | Source: Skilled Nursing Facility | Attending: Internal Medicine | Admitting: Internal Medicine

## 2018-08-09 DIAGNOSIS — I10 Essential (primary) hypertension: Secondary | ICD-10-CM | POA: Insufficient documentation

## 2018-08-09 DIAGNOSIS — S72002D Fracture of unspecified part of neck of left femur, subsequent encounter for closed fracture with routine healing: Secondary | ICD-10-CM | POA: Insufficient documentation

## 2018-08-09 DIAGNOSIS — Z95818 Presence of other cardiac implants and grafts: Secondary | ICD-10-CM | POA: Insufficient documentation

## 2018-08-09 DIAGNOSIS — Z471 Aftercare following joint replacement surgery: Secondary | ICD-10-CM | POA: Insufficient documentation

## 2018-08-09 LAB — TSH: TSH: 3.169 u[IU]/mL (ref 0.350–4.500)

## 2018-08-09 LAB — CBC WITH DIFFERENTIAL/PLATELET
BASOS ABS: 0 10*3/uL (ref 0.0–0.1)
BASOS PCT: 0 %
EOS ABS: 0.1 10*3/uL (ref 0.0–0.7)
Eosinophils Relative: 1 %
HCT: 40.1 % (ref 39.0–52.0)
HEMOGLOBIN: 13.1 g/dL (ref 13.0–17.0)
Lymphocytes Relative: 17 %
Lymphs Abs: 2.2 10*3/uL (ref 0.7–4.0)
MCH: 33.9 pg (ref 26.0–34.0)
MCHC: 32.7 g/dL (ref 30.0–36.0)
MCV: 103.9 fL — ABNORMAL HIGH (ref 78.0–100.0)
Monocytes Absolute: 1.4 10*3/uL — ABNORMAL HIGH (ref 0.1–1.0)
Monocytes Relative: 11 %
NEUTROS ABS: 9.2 10*3/uL — AB (ref 1.7–7.7)
NEUTROS PCT: 71 %
Platelets: 201 10*3/uL (ref 150–400)
RBC: 3.86 MIL/uL — AB (ref 4.22–5.81)
RDW: 13.7 % (ref 11.5–15.5)
WBC: 12.8 10*3/uL — AB (ref 4.0–10.5)

## 2018-08-09 LAB — BASIC METABOLIC PANEL
ANION GAP: 7 (ref 5–15)
BUN: 20 mg/dL (ref 8–23)
CO2: 30 mmol/L (ref 22–32)
Calcium: 8.4 mg/dL — ABNORMAL LOW (ref 8.9–10.3)
Chloride: 104 mmol/L (ref 98–111)
Creatinine, Ser: 0.82 mg/dL (ref 0.61–1.24)
Glucose, Bld: 97 mg/dL (ref 70–99)
Potassium: 4.3 mmol/L (ref 3.5–5.1)
SODIUM: 141 mmol/L (ref 135–145)

## 2018-08-09 LAB — MAGNESIUM: MAGNESIUM: 2.1 mg/dL (ref 1.7–2.4)

## 2018-08-09 MED ORDER — HYDROCODONE-ACETAMINOPHEN 5-325 MG PO TABS
1.0000 | ORAL_TABLET | ORAL | 0 refills | Status: AC | PRN
Start: 2018-08-09 — End: ?

## 2018-08-09 NOTE — Telephone Encounter (Signed)
RX Fax for [facility]@ [phone]  

## 2018-08-13 ENCOUNTER — Encounter: Payer: Self-pay | Admitting: Internal Medicine

## 2018-08-13 ENCOUNTER — Non-Acute Institutional Stay (SKILLED_NURSING_FACILITY): Payer: Medicare PPO | Admitting: Internal Medicine

## 2018-08-13 DIAGNOSIS — M05741 Rheumatoid arthritis with rheumatoid factor of right hand without organ or systems involvement: Secondary | ICD-10-CM | POA: Diagnosis not present

## 2018-08-13 DIAGNOSIS — R001 Bradycardia, unspecified: Secondary | ICD-10-CM | POA: Diagnosis not present

## 2018-08-13 DIAGNOSIS — R600 Localized edema: Secondary | ICD-10-CM

## 2018-08-13 DIAGNOSIS — I2510 Atherosclerotic heart disease of native coronary artery without angina pectoris: Secondary | ICD-10-CM | POA: Diagnosis not present

## 2018-08-13 DIAGNOSIS — E785 Hyperlipidemia, unspecified: Secondary | ICD-10-CM

## 2018-08-13 DIAGNOSIS — R413 Other amnesia: Secondary | ICD-10-CM

## 2018-08-13 DIAGNOSIS — I4891 Unspecified atrial fibrillation: Secondary | ICD-10-CM

## 2018-08-13 DIAGNOSIS — Z96642 Presence of left artificial hip joint: Secondary | ICD-10-CM | POA: Insufficient documentation

## 2018-08-13 DIAGNOSIS — M05742 Rheumatoid arthritis with rheumatoid factor of left hand without organ or systems involvement: Secondary | ICD-10-CM

## 2018-08-13 NOTE — Progress Notes (Signed)
Provider:  Dr. Veleta Miners Location:  [facility] Room Number: W258N Place of Service:  SNF (31)  PCP: Caren Macadam, MD Patient Care Team: Caren Macadam, MD as PCP - General (Family Medicine) Herminio Commons, MD as PCP - Cardiology (Cardiology) Gala Romney Cristopher Estimable, MD as Consulting Physician (Gastroenterology) Hennie Duos, MD as Consulting Physician (Rheumatology) Sanjuana Kava, MD as Consulting Physician (Orthopedic Surgery) Allyn Kenner, MD as Consulting Physician (Dermatology)  Extended Emergency Contact Information Primary Emergency Contact: [place], [place] [address] Johnnette Litter of [place] Phone: [phone] Mobile Phone: [phone] Relation: Daughter  Code Status: DNR Goals of Care: Advanced Directive information Advanced Directives 08/08/2018  Does Patient Have a Medical Advance Directive? Yes  Type of Advance Directive Out of facility DNR (pink MOST or yellow form)  Does patient want to make changes to medical advance directive? No - Patient declined  Copy of [place] in Chart? No - copy requested  Would patient like information on creating a medical advance directive? -      Chief Complaint  Patient presents with  . New Admit To SNF    New Admit to [facility]    HPI: Patient is a 77 y.o. male seen today for admission to SNF for therapy.after staying in the hospital from 08/12-08/15 for  Left hip Fracture S/P Left Hemiarthroplasty.on 08/13  Patient has h/o Atrial Fibrillation not on any Anticoagulation due to h/o Falls, CAD s/p Stenting, EF of 45-50% 05/19, h/o Bradycardia, Rheumatoid arthritis, Hypothyroidism, Hypertension, LE edema  Patient came to ED after he had a fall at home and had Pain in his Left hip. It seems patient has had few falls in the past. But this time he was found to have Left Hip fracture. He underwent Arthroplasty on 08/13. He did well after Surgery and now he is  here for therapy. He is walking with Walker and assist. Pain seems controlled He was upset that he has to stay here for few weeks for therapy. No other complains. He lives by himself. Has supportive daughter. Just quit driving recently. Walks with [employer]. Has caregiver at home for few hours a day   Past Medical History:  Diagnosis Date  . A-fib ([place])   . Arthritis    osteoarthritis  . Basal cell carcinoma (BCC) 08/29/2017  . Cataract   . Frequent falls   . Glaucoma   . High cholesterol   . Hypertension   . MI (myocardial infarction) ([place])   . Rheumatoid arthritis ([place])   . Thyroid disease    Past Surgical History:  Procedure Laterality Date  . CORONARY ANGIOPLASTY WITH STENT PLACEMENT    . HIP ARTHROPLASTY Left 08/06/2018   Procedure: ARTHROPLASTY BIPOLAR HIP (HEMIARTHROPLASTY);  Surgeon: Paralee Cancel, MD;  Location: [facility];  Service: Orthopedics;  Laterality: Left;  . JOINT REPLACEMENT     left knee  . REPLACEMENT TOTAL KNEE Left     reports that he quit smoking about 42 years ago. His smoking use included cigarettes. He quit after 15.00 years of use. He has never used smokeless tobacco. He reports that he does not drink alcohol or use drugs. Social History   Socioeconomic History  . Marital status: Widowed    Spouse name: Not on file  . Number of children: 1  . Years of education: 51  . Highest education level: Not on file  Occupational History  . 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 Financial resource strain: Not on file  . Food insecurity:    Worry: Not on file    Inability: Not on file  . Transportation needs:    Medical: Not on file    Non-medical: Not on file  Tobacco Use  . Smoking status: Former Smoker    Years: 15.00    Types: Cigarettes    Last attempt to quit: 12/26/1975    Years since quitting: 42.6  . Smokeless tobacco: Never Used  Substance and Sexual Activity  . Alcohol use: No  . Drug use: No  .  Sexual activity: Not Currently  Lifestyle  . Physical activity:    Days per week: Not on file    Minutes per session: Not on file  . Stress: Not on file  Relationships  . Social connections:    Talks on phone: Not on file    Gets together: Not on file    Attends religious service: Not on file    Active member of club or organization: Not on file    Attends meetings of clubs or organizations: Not on file    Relationship status: Not on file  . Intimate partner violence:    Fear of current or ex partner: Not on file    Emotionally abused: Not on file    Physically abused: Not on file    Forced sexual activity: Not on file  Other Topics Concern  . Not on file  Social History Narrative   Forensic psychologist    Widow   Lives alone   Lives near [name]/daughter    Functional Status Survey:    Family History  Problem Relation Age of Onset  . Heart attack Father 82  . Early death Father   . Alzheimer's disease Mother 74  . Early death Sister        MVA  . Hyperlipidemia Brother   . Heart disease Brother     Health Maintenance  Topic Date Due  . INFLUENZA VACCINE  07/25/2018  . PNA vac Low Risk Adult (2 of 2 - PPSV23) 11/27/2018  . TETANUS/TDAP  05/19/2027    Allergies  Allergen Reactions  . Metoprolol     Prescribed for A. Fib 12/17-12/22/18 nocturnal heart rates in the high 30s and 40s, beta blocker discontinued    Outpatient Encounter Medications as of 08/13/2018  Medication Sig  . acetaminophen (TYLENOL) 325 MG tablet Take 650 mg by mouth every 6 (six) hours as needed.  Marland Kitchen aspirin (ASPIRIN CHILDRENS) 81 MG chewable tablet Chew 1 tablet (81 mg total) by mouth 2 (two) times daily. Take for 4 weeks, then resume regular dose.  . cyanocobalamin 1000 MCG tablet Take 1,000 mcg by mouth daily.  Marland Kitchen docusate sodium (COLACE) 100 MG capsule Take 1 capsule (100 mg total) by mouth 2 (two) times daily.  Marland Kitchen donepezil (ARICEPT) 10 MG tablet Take 1 tablet (10 mg total) by mouth at  bedtime.  . ferrous sulfate 325 (65 FE) MG tablet Take 1 tablet (325 mg total) by mouth 2 (two) times daily with a meal.  . folic acid (FOLVITE) 1 MG tablet TAKE 1 TABLET BY MOUTH ONCE DAILY  . furosemide (LASIX) 40 MG tablet Take 40 mg by mouth daily.  Marland Kitchen HYDROcodone-acetaminophen (NORCO) 5-325 MG tablet Take 1-2 tablets by mouth every 4 (four) hours as needed for moderate pain or severe pain.  Marland Kitchen levothyroxine (SYNTHROID, LEVOTHROID) 75 MCG tablet TAKE 1  TABLET BY MOUTH ONCE DAILY BEFORE BREAKFAST  . Magnesium 200 MG TABS Take 400 mg by mouth daily.  . Melatonin 3 MG TABS Take 3 mg by mouth at bedtime.  . methocarbamol (ROBAXIN) 500 MG tablet Take 1 tablet (500 mg total) by mouth every 6 (six) hours as needed for muscle spasms.  . methotrexate 2.5 MG tablet Take 15 mg by mouth every Sunday. 6 tablets on Sunday  . polycarbophil (FIBERCON) 625 MG tablet Take 625 mg by mouth at bedtime.  . polyethylene glycol (MIRALAX / GLYCOLAX) packet Take 17 g by mouth 2 (two) times daily.  . potassium chloride SA (K-DUR,KLOR-CON) 20 MEQ tablet Take 1 tablet (20 mEq total) by mouth 2 (two) times daily.  . simvastatin (ZOCOR) 40 MG tablet TAKE 1 TABLET BY MOUTH ONCE DAILY  . timolol (TIMOPTIC) 0.5 % ophthalmic solution 1 drop 2 (two) times daily.  Marland Kitchen triamcinolone (NASACORT ALLERGY 24HR) 55 MCG/ACT AERO nasal inhaler Place 1 spray into the nose daily.  Marland Kitchen aspirin EC 81 MG tablet Take 81 mg by mouth daily. Starting 09/06/2018   No facility-administered encounter medications on file as of 08/13/2018.     Review of Systems Review of Systems  Constitutional: Negative for activity change, appetite change, chills, diaphoresis, fatigue and fever.  HENT: Negative for mouth sores, postnasal drip, rhinorrhea, sinus pain and sore throat.   Respiratory: Negative for apnea, cough, chest tightness, shortness of breath and wheezing.   Cardiovascular: Negative for chest pain, palpitations and leg swelling.  Gastrointestinal:  Negative for abdominal distention, abdominal pain, constipation, diarrhea, nausea and vomiting.  Genitourinary: Negative for dysuria and frequency.  Musculoskeletal: Negative for arthralgias, joint swelling and myalgias.  Skin: Negative for rash.  Neurological: Negative for dizziness, syncope, weakness, light-headedness and numbness.  Psychiatric/Behavioral: Negative for behavioral problems, confusion and sleep disturbance.    Vitals:   08/13/18 0814  BP: 137/72  Pulse: (!) 53  Resp: 16  Temp: (!) 97.2 F (36.2 C)  TempSrc: Oral  Weight: 181 lb 3.2 oz (82.2 kg)  Height: 5\' 10"  (1.778 m)   Body mass index is 26 kg/m. Physical Exam  Constitutional: He is oriented to person, place, and time. He appears well-developed and well-nourished.  HENT:  Head: Normocephalic.  Mouth/Throat: Oropharynx is clear and moist.  Eyes: Pupils are equal, round, and reactive to light.  Neck: Neck supple.  Cardiovascular: Regular rhythm and normal heart sounds.  No murmur heard. Pulmonary/Chest: Effort normal and breath sounds normal. No stridor. No respiratory distress. He has no wheezes.  Abdominal: Soft. Bowel sounds are normal. He exhibits no distension. There is no tenderness. There is no guarding.  Musculoskeletal:  Moderate Edema Bilateral  Neurological: He is alert and oriented to person, place, and time.  No Focal deficit  Skin: Skin is warm and dry.  Psychiatric: He has a normal mood and affect. His behavior is normal. Thought content normal.    Labs reviewed: Basic Metabolic Panel: Recent Labs    12/19/17 1051 12/25/17 0430  08/07/18 0529 08/08/18 0541 08/09/18 0734  NA  --  137   < > 138 138 141  K  --  5.0   < > 3.8 4.3 4.3  CL  --  101   < > 102 102 104  CO2  --  27   < > 28 28 30   GLUCOSE  --  99   < > 146* 108* 97  BUN  --  11   < > 17 20  20  CREATININE  --  0.89   < > 0.90 0.78 0.82  CALCIUM  --  8.6*   < > 8.6* 8.1* 8.4*  MG 1.8 2.0  --   --   --  2.1   < > = values  in this interval not displayed.   Liver Function Tests: Recent Labs    12/11/17 1317 01/30/18 1101  PROT  --  6.6  ALBUMIN 2.2*  --    No results for input(s): LIPASE, AMYLASE in the last 8760 hours. No results for input(s): AMMONIA in the last 8760 hours. CBC: Recent Labs    12/25/17 0430 08/05/18 1009  08/07/18 0529 08/08/18 0541 08/09/18 0734  WBC 8.0 11.0*   < > 12.5* 14.5* 12.8*  NEUTROABS 4.2 8.0*  --   --   --  9.2*  HGB 11.3* 13.8   < > 13.2 12.4* 13.1  HCT 36.0* 41.2   < > 39.5 37.1* 40.1  MCV 100.0 103.5*   < > 102.1* 103.1* 103.9*  PLT 227 231   < > 220 222 201   < > = values in this interval not displayed.   Cardiac Enzymes: Recent Labs    12/10/17 1754 12/10/17 2212 12/11/17 0422 12/11/17 2013 12/12/17 1309 01/30/18 1101  CKTOTAL  --   --   --  75 79 36  CKMB  --   --   --  2.8  --   --   TROPONINI <0.03 <0.03 <0.03  --   --   --    BNP: Invalid input(s): POCBNP No results found for: HGBA1C Lab Results  Component Value Date   TSH 3.169 08/09/2018   Lab Results  Component Value Date   VITAMINB12 292 12/19/2017   Lab Results  Component Value Date   FOLATE 39.0 12/19/2017   Lab Results  Component Value Date   IRON 25 (L) 12/10/2017   TIBC 210 (L) 12/10/2017   FERRITIN 577 (H) 12/10/2017    Imaging and Procedures obtained prior to SNF admission: No results found.  Assessment/Plan  S/P Hemiarthroplasty of Left hip Pain Controlled with Norco and robaxin Doing well with therapy Follow up with Ortho  Atrial fibrillation Patient not on any Anticoagulation due to falls Continue on aspirin  Sinus bradycardia at baseline Cannot tolerate beta blocker Continue to monitor  CAD  Medical management Follows with Cardiology  Rheumatoid arthritis  On Methotrexate  Hyperlipidemia On Statin Follows with PCP  LE edema On Lasix  Will do weights in facility Increase Lasix to 60 mg for 3 days and then repeat BMP. Try Compression  stockings  Memory loss Is on Aricept for mild cognitive impairnemrn Hypothyroidism TSH normal  Continue same dose Hyperlipidemia Continue Statin  Gait Abnormality Follows with Neurology Therapy will help Discharge home Family/ staff Communication:   Labs/tests ordered:   Total time spent in this patient care encounter was 45_ minutes; greater than 50% of the visit spent counseling patient, reviewing records , Labs and coordinating care for problems addressed at this encounter.

## 2018-08-15 ENCOUNTER — Encounter (HOSPITAL_COMMUNITY)
Admission: RE | Admit: 2018-08-15 | Discharge: 2018-08-15 | Disposition: A | Discharge status: Home / Self Care | Payer: Medicare PPO | Source: Skilled Nursing Facility | Attending: Internal Medicine | Admitting: Internal Medicine

## 2018-08-15 DIAGNOSIS — Z95818 Presence of other cardiac implants and grafts: Secondary | ICD-10-CM | POA: Insufficient documentation

## 2018-08-15 DIAGNOSIS — Z471 Aftercare following joint replacement surgery: Secondary | ICD-10-CM | POA: Insufficient documentation

## 2018-08-15 DIAGNOSIS — I10 Essential (primary) hypertension: Secondary | ICD-10-CM | POA: Insufficient documentation

## 2018-08-15 DIAGNOSIS — S72002D Fracture of unspecified part of neck of left femur, subsequent encounter for closed fracture with routine healing: Secondary | ICD-10-CM | POA: Insufficient documentation

## 2018-08-15 LAB — BASIC METABOLIC PANEL
Anion gap: 7 (ref 5–15)
BUN: 10 mg/dL (ref 8–23)
CHLORIDE: 103 mmol/L (ref 98–111)
CO2: 27 mmol/L (ref 22–32)
Calcium: 8.4 mg/dL — ABNORMAL LOW (ref 8.9–10.3)
Creatinine, Ser: 0.78 mg/dL (ref 0.61–1.24)
GFR calc Af Amer: >60 mL/min (ref >60)
GFR calc non Af Amer: >60 mL/min (ref >60)
GLUCOSE: 102 mg/dL — AB (ref 70–99)
POTASSIUM: 4.1 mmol/L (ref 3.5–5.1)
Sodium: 137 mmol/L (ref 135–145)

## 2018-08-22 ENCOUNTER — Non-Acute Institutional Stay (SKILLED_NURSING_FACILITY): Payer: Medicare PPO | Admitting: Internal Medicine

## 2018-08-22 ENCOUNTER — Encounter: Payer: Self-pay | Admitting: Internal Medicine

## 2018-08-22 DIAGNOSIS — Z96642 Presence of left artificial hip joint: Secondary | ICD-10-CM | POA: Diagnosis not present

## 2018-08-22 DIAGNOSIS — R001 Bradycardia, unspecified: Secondary | ICD-10-CM | POA: Diagnosis not present

## 2018-08-22 DIAGNOSIS — R609 Edema, unspecified: Secondary | ICD-10-CM | POA: Diagnosis not present

## 2018-08-22 DIAGNOSIS — I4891 Unspecified atrial fibrillation: Secondary | ICD-10-CM | POA: Diagnosis not present

## 2018-08-22 NOTE — Progress Notes (Signed)
Location:    [facility] Room Number: 151/P Place of Service:  SNF [phone]) Provider:  Victorino Sparrow, MD  Patient Care Team: Caren Macadam, MD as PCP - General (Family Medicine) Herminio Commons, MD as PCP - Cardiology (Cardiology) Gala Romney Cristopher Estimable, MD as Consulting Physician (Gastroenterology) Hennie Duos, MD as Consulting Physician (Rheumatology) Sanjuana Kava, MD as Consulting Physician (Orthopedic Surgery) Allyn Kenner, MD as Consulting Physician (Dermatology)  Extended Emergency Contact Information Primary Emergency Contact: [place], [place] [address] [address]te Litter of [place] Phone: [phone] Mobile Phone: [phone] Relation: Daughter  Code Status:  DNR Goals of care: Advanced Directive information Advanced Directives 08/22/2018  Does Patient Have a Medical Advance Directive? Yes  Type of Advance Directive Out of facility DNR (pink MOST or yellow form)  Does patient want to make changes to medical advance directive? No - Patient declined  Copy of [place] in Chart? No - copy requested  Would patient like information on creating a medical advance directive? -   Chief complaint-acute visit to follow-up possibly increased lower extremity edema    HPI:  Pt is a 77 y.o. male seen today for an acute visit for  Possibly increasing lower extremity edema.  He is here for therapy after a hospital stay for left hip repair after sustaining a fracture.  Patient has h/o Atrial Fibrillation not on any Anticoagulation due to h/o Falls, CAD s/p Stenting, EF of 45-50% 05/19, h/o Bradycardia, Rheumatoid arthritis, Hypothyroidism, Hypertension, LE edema  It is thought he may have some mildly increased lower extremity edema- appears his weight is actually down about 4 pounds since admission he is not complaining of any increased cough or shortness of breath--- says he is tolerating therapy well.  He  did come in at 40 mg a day Lasix-this was temporarily increased to 60 mg a day secondary to concerns he may have some increased edema-however now he has been on 20 mg a day it appears for the last 3 days--    Past Medical History:  Diagnosis Date  . A-fib ([place])   . Arthritis    osteoarthritis  . Basal cell carcinoma (BCC) 08/29/2017  . Cataract   . Frequent falls   . Glaucoma   . High cholesterol   . Hypertension   . MI (myocardial infarction) ([place])   . Rheumatoid arthritis ([place])   . Thyroid disease    Past Surgical History:  Procedure Laterality Date  . CORONARY ANGIOPLASTY WITH STENT PLACEMENT    . HIP ARTHROPLASTY Left 08/06/2018   Procedure: ARTHROPLASTY BIPOLAR HIP (HEMIARTHROPLASTY);  Surgeon: Paralee Cancel, MD;  Location: [facility];  Service: Orthopedics;  Laterality: Left;  . JOINT REPLACEMENT     left knee  . REPLACEMENT TOTAL KNEE Left     Allergies  Allergen Reactions  . Metoprolol     Prescribed for A. Fib 12/17-12/22/18 nocturnal heart rates in the high 30s and 40s, beta blocker discontinued    Outpatient Encounter Medications as of 08/22/2018  Medication Sig  . acetaminophen (TYLENOL) 325 MG tablet Take 650 mg by mouth every 6 (six) hours as needed.  Marland Kitchen aspirin (ASPIRIN CHILDRENS) 81 MG chewable tablet Chew 1 tablet (81 mg total) by mouth 2 (two) times daily. Take for 4 weeks, then resume regular dose.  Marland Kitchen aspirin EC 81 MG tablet Take 81 mg by mouth daily. Starting 09/06/2018  . cyanocobalamin 1000 MCG  tablet Take 1,000 mcg by mouth daily.  Marland Kitchen donepezil (ARICEPT) 10 MG tablet Take 1 tablet (10 mg total) by mouth at bedtime.  . folic acid (FOLVITE) 1 MG tablet TAKE 1 TABLET BY MOUTH ONCE DAILY  . furosemide (LASIX) 40 MG tablet Take 20 mg by mouth daily.   Marland Kitchen HYDROcodone-acetaminophen (NORCO) 5-325 MG tablet Take 1-2 tablets by mouth every 4 (four) hours as needed for moderate pain or severe pain.  Marland Kitchen levothyroxine (SYNTHROID, LEVOTHROID) 75 MCG tablet TAKE 1 TABLET BY  MOUTH ONCE DAILY BEFORE BREAKFAST  . Magnesium 200 MG TABS Take 400 mg by mouth daily.  . methocarbamol (ROBAXIN) 500 MG tablet Take 1 tablet (500 mg total) by mouth every 6 (six) hours as needed for muscle spasms.  . methotrexate 2.5 MG tablet Take 15 mg by mouth every Sunday. 6 tablets on Sunday  . polycarbophil (FIBERCON) 625 MG tablet Take 625 mg by mouth at bedtime.  . polyethylene glycol (MIRALAX / GLYCOLAX) packet Take 17 g by mouth 2 (two) times daily.  . potassium chloride SA (K-DUR,KLOR-CON) 20 MEQ tablet Take 1 tablet (20 mEq total) by mouth 2 (two) times daily.  Marland Kitchen senna (SENOKOT) 8.6 MG tablet Take 1 tablet by mouth daily.  . simvastatin (ZOCOR) 40 MG tablet TAKE 1 TABLET BY MOUTH ONCE DAILY  . timolol (TIMOPTIC) 0.5 % ophthalmic solution 1 drop 2 (two) times daily.  Marland Kitchen triamcinolone (NASACORT ALLERGY 24HR) 55 MCG/ACT AERO nasal inhaler Place 1 spray into the nose daily.  . [DISCONTINUED] docusate sodium (COLACE) 100 MG capsule Take 1 capsule (100 mg total) by mouth 2 (two) times daily.  . [DISCONTINUED] ferrous sulfate 325 (65 FE) MG tablet Take 1 tablet (325 mg total) by mouth 2 (two) times daily with a meal.  . [DISCONTINUED] Melatonin 3 MG TABS Take 3 mg by mouth at bedtime.   No facility-administered encounter medications on file as of 08/22/2018.     Review of Systems   General is not complaining of fever chills.  Skin does not complain of rashes or itching.  Head ears eyes nose mouth and throat is not complain of visual changes he has prescription lenses is not complaining of sore throat.  Respiratory denies shortness of breath or cough.  Cardiac denies any chest pain he appears to have some lower extremity edema bit more on the left on his surgical sideversus the right --pedal edema does appear to be somewhat increased bilaterally.  GI is not complaining of abdominal pain nausea vomiting diarrhea constipation.  GU is not complaining of dysuria.  Musculoskeletal at  this point is not really complaining of hip pain or joint pain.  Neurologic does not complain of dizziness headache syncope or numbness.  Psych appears to be good spirits he is pleasant does not complain of being depressed although he apparently he is expressed desires to go home  Immunization History  Administered Date(s) Administered  . Influenza Whole 08/07/2016  . Influenza,inj,Quad PF,6+ Mos 09/21/2017  . Pneumococcal Conjugate-13 11/27/2017  . Tdap 05/18/2017  . Zoster 12/10/2014   Pertinent  Health Maintenance Due  Topic Date Due  . INFLUENZA VACCINE  09/22/2018 (Originally 07/25/2018)  . PNA vac Low Risk Adult (2 of 2 - PPSV23) 11/27/2018   Fall Risk  01/01/2018 11/27/2017 07/10/2017 04/30/2017  Falls in the past year? Yes Yes Yes Yes  Number falls in past yr: 2 or more 2 or more 2 or more 2 or more  Injury with Fall? Yes Yes Yes  Yes  Risk Factor Category  - High Fall Risk - -  Risk for fall due to : - Impaired balance/gait History of fall(s) -  Follow up - - Education provided -   Functional Status Survey:     Temperature is 97.2 pulse is 55 respirations of 16 blood pressure 132/68--weight is 177.8 this appears down about 4 pounds since admission Physical Exam   In general this is a pleasant elderly male in no distress sitting comfortably in his wheelchair.  His skin is warm and dry.  Eyes visual acuity appears to be intact he has prescription lenses.  Oropharynx is clear mucous membranes moist.  Chest is clear to auscultation there is no labored breathing.  Heart is regular irregular rhythm slightly bradycardic in the 50s- he has moderate lower extremity edema this appears to be somewhat increased of his feet however-he has somewhat slightly more edema on his left leg which is the surgical side versus the right side----pedal pulses are somewhat reduced bilaterally I suspect because of the edema    Abdomen is soft nontender with positive bowel  sounds.  Musculoskeletal is able to move all extremities x4 is currently ambulating in wheelchair.  Neurologic he is alert could not really appreciate any lateralizing findings his speech is clear.  Psych he is pleasant and appropriate  Labs reviewed: Recent Labs    12/19/17 1051 12/25/17 0430  08/08/18 0541 08/09/18 0734 08/15/18 0710  NA  --  137   < > 138 141 137  K  --  5.0   < > 4.3 4.3 4.1  CL  --  101   < > 102 104 103  CO2  --  27   < > 28 30 27   GLUCOSE  --  99   < > 108* 97 102*  BUN  --  11   < > 20 20 10   CREATININE  --  0.89   < > 0.78 0.82 0.78  CALCIUM  --  8.6*   < > 8.1* 8.4* 8.4*  MG 1.8 2.0  --   --  2.1  --    < > = values in this interval not displayed.   Recent Labs    12/11/17 1317 01/30/18 1101  PROT  --  6.6  ALBUMIN 2.2*  --    Recent Labs    12/25/17 0430 08/05/18 1009  08/07/18 0529 08/08/18 0541 08/09/18 0734  WBC 8.0 11.0*   < > 12.5* 14.5* 12.8*  NEUTROABS 4.2 8.0*  --   --   --  9.2*  HGB 11.3* 13.8   < > 13.2 12.4* 13.1  HCT 36.0* 41.2   < > 39.5 37.1* 40.1  MCV 100.0 103.5*   < > 102.1* 103.1* 103.9*  PLT 227 231   < > 220 222 201   < > = values in this interval not displayed.   Lab Results  Component Value Date   TSH 3.169 08/09/2018   No results found for: HGBA1C Lab Results  Component Value Date   CHOL 94 04/30/2017   HDL 31 (L) 04/30/2017   LDLCALC 48 04/30/2017   TRIG 73 04/30/2017   CHOLHDL 3.0 04/30/2017    Significant Diagnostic Results in last 30 days:  Dg Chest 1 View  Result Date: 08/05/2018 CLINICAL DATA:  Atrial fibrillation EXAM: CHEST  1 VIEW COMPARISON:  December 10, 2017 FINDINGS: There is no edema or consolidation. There is cardiomegaly with pulmonary vascularity normal. There is aortic atherosclerosis.  There is deviation of the upper thoracic trachea to the left. There is degenerative change in the right shoulder with superior migration of the right humeral head. IMPRESSION: 1. Cardiomegaly. No  edema or consolidation. Aortic atherosclerosis. 2. Shift of the upper thoracic trachea to the left. This finding was not present on prior study. Question localized thyroid enlargement in this area. 3. 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 Aortic Atherosclerosis (ICD10-I70.0). Electronically Signed   By: Lowella Grip III M.D.   On: 08/05/2018 10:04   Pelvis Portable  Result Date: 08/06/2018 CLINICAL DATA:  Postop left hip replacement EXAM: PORTABLE PELVIS 1-2 VIEWS COMPARISON:  None. FINDINGS: Left hip hemiarthroplasty with associated soft tissue gas. Alignment is normal. No periprosthetic abnormality. IMPRESSION: Expected postoperative appearance of left hip hemiarthroplasty. Electronically Signed   By: Ulyses Jarred M.D.   On: 08/06/2018 16:39   Dg Hip Unilat W Or Wo Pelvis 2-3 Views Left  Result Date: 08/05/2018 CLINICAL DATA:  Status post fall, hip pain EXAM: DG HIP (WITH OR WITHOUT PELVIS) 2-3V LEFT COMPARISON:  None. FINDINGS: Generalized osteopenia. Mildly displaced and angulated left femoral neck fracture. No other fracture or dislocation. Peripheral vascular atherosclerotic disease. IMPRESSION: Mildly displaced and angulated left femoral neck fracture. Electronically Signed   By: Kathreen Devoid   On: 08/05/2018 10:05    Assessment/Plan  #1 history of possibly increased leg edema -- more so of his feet- will increase his Lasix back to 40 mg a day routinely-he continues on potassium supplementation 20 mEq twice daily.  His weight appears to be stable to actually slightly reduced- but the edema does appear to be somewhat increased.  He is not complaining of any shortness of breath or chest pain.  Will update a metabolic panel tomorrow and monitor weights and clinical status but appears to be stable.  2.  Atrial fibrillation he is not on anticoagulation because of his history of falls he is on aspirin- this appears to be rate  controlled he does have a history of sinus bradycardia this appears to be at baseline.--  3.  History of left hip repair after fracture-at this point appears stable he is participating with therapy says he is doing well with this- he continues on Norco as needed for pain.--He is currently on aspirin 81 mg twice daily for DVT prophylaxis through September 12 and then will be on aspirin 81 mg a day  [id number]-of note greater than 25 minutes spent assessing patient-reviewing his chart and labs- discussing status with nursing staff- and coordinating and formulating a plan of care- of note greater than 50% of time spent coordinating a plan of care

## 2018-08-23 ENCOUNTER — Encounter (HOSPITAL_COMMUNITY)
Admission: RE | Admit: 2018-08-23 | Discharge: 2018-08-23 | Disposition: A | Discharge status: Home / Self Care | Payer: Medicare PPO | Source: Skilled Nursing Facility | Attending: Internal Medicine | Admitting: Internal Medicine

## 2018-08-23 DIAGNOSIS — S72002D Fracture of unspecified part of neck of left femur, subsequent encounter for closed fracture with routine healing: Secondary | ICD-10-CM | POA: Insufficient documentation

## 2018-08-23 DIAGNOSIS — Z471 Aftercare following joint replacement surgery: Secondary | ICD-10-CM | POA: Insufficient documentation

## 2018-08-23 DIAGNOSIS — Z95818 Presence of other cardiac implants and grafts: Secondary | ICD-10-CM | POA: Insufficient documentation

## 2018-08-23 LAB — CBC WITH DIFFERENTIAL/PLATELET
BASOS PCT: 1 %
Basophils Absolute: 0.1 10*3/uL (ref 0.0–0.1)
EOS PCT: 2 %
Eosinophils Absolute: 0.2 10*3/uL (ref 0.0–0.7)
HCT: 39.6 % (ref 39.0–52.0)
HEMOGLOBIN: 13 g/dL (ref 13.0–17.0)
Lymphocytes Relative: 23 %
Lymphs Abs: 1.8 10*3/uL (ref 0.7–4.0)
MCH: 33.9 pg (ref 26.0–34.0)
MCHC: 32.8 g/dL (ref 30.0–36.0)
MCV: 103.4 fL — ABNORMAL HIGH (ref 78.0–100.0)
Monocytes Absolute: 1 10*3/uL (ref 0.1–1.0)
Monocytes Relative: 13 %
Neutro Abs: 4.8 10*3/uL (ref 1.7–7.7)
Neutrophils Relative %: 61 %
PLATELETS: 317 10*3/uL (ref 150–400)
RBC: 3.83 MIL/uL — AB (ref 4.22–5.81)
RDW: 13.9 % (ref 11.5–15.5)
WBC: 7.9 10*3/uL (ref 4.0–10.5)

## 2018-08-23 LAB — BASIC METABOLIC PANEL
Anion gap: 8 (ref 5–15)
BUN: 8 mg/dL (ref 8–23)
CALCIUM: 8.6 mg/dL — AB (ref 8.9–10.3)
CO2: 28 mmol/L (ref 22–32)
Chloride: 103 mmol/L (ref 98–111)
Creatinine, Ser: 0.71 mg/dL (ref 0.61–1.24)
GFR calc non Af Amer: >60 mL/min (ref >60)
Glucose, Bld: 95 mg/dL (ref 70–99)
Potassium: 3.5 mmol/L (ref 3.5–5.1)
SODIUM: 139 mmol/L (ref 135–145)

## 2018-08-24 ENCOUNTER — Non-Acute Institutional Stay (SKILLED_NURSING_FACILITY): Payer: Medicare PPO | Admitting: Internal Medicine

## 2018-08-24 ENCOUNTER — Inpatient Hospital Stay (HOSPITAL_COMMUNITY): Payer: Medicare PPO | Attending: Internal Medicine

## 2018-08-24 ENCOUNTER — Encounter (HOSPITAL_COMMUNITY)
Admission: RE | Admit: 2018-08-24 | Discharge: 2018-08-24 | Disposition: A | Discharge status: Home / Self Care | Payer: Medicare PPO | Source: Skilled Nursing Facility | Attending: Internal Medicine | Admitting: Internal Medicine

## 2018-08-24 ENCOUNTER — Encounter: Payer: Self-pay | Admitting: Internal Medicine

## 2018-08-24 DIAGNOSIS — Z96642 Presence of left artificial hip joint: Secondary | ICD-10-CM | POA: Diagnosis not present

## 2018-08-24 DIAGNOSIS — I4891 Unspecified atrial fibrillation: Secondary | ICD-10-CM | POA: Diagnosis not present

## 2018-08-24 DIAGNOSIS — I2510 Atherosclerotic heart disease of native coronary artery without angina pectoris: Secondary | ICD-10-CM | POA: Diagnosis not present

## 2018-08-24 DIAGNOSIS — R413 Other amnesia: Secondary | ICD-10-CM

## 2018-08-24 DIAGNOSIS — I10 Essential (primary) hypertension: Secondary | ICD-10-CM

## 2018-08-24 DIAGNOSIS — R001 Bradycardia, unspecified: Secondary | ICD-10-CM

## 2018-08-24 DIAGNOSIS — E039 Hypothyroidism, unspecified: Secondary | ICD-10-CM

## 2018-08-24 LAB — BASIC METABOLIC PANEL
ANION GAP: 7 (ref 5–15)
BUN: 8 mg/dL (ref 8–23)
CALCIUM: 8.8 mg/dL — AB (ref 8.9–10.3)
CHLORIDE: 101 mmol/L (ref 98–111)
CO2: 30 mmol/L (ref 22–32)
Creatinine, Ser: 0.81 mg/dL (ref 0.61–1.24)
GFR calc Af Amer: >60 mL/min (ref >60)
GFR calc non Af Amer: >60 mL/min (ref >60)
GLUCOSE: 100 mg/dL — AB (ref 70–99)
POTASSIUM: 4.1 mmol/L (ref 3.5–5.1)
Sodium: 138 mmol/L (ref 135–145)

## 2018-08-24 NOTE — Progress Notes (Signed)
This is a discharge note.  Level of care is skilled.  Facility is [facility].  Chief complaint discharge note.  History of present illness.  Patient is a pleasant 77 year old male seen today for discharge from facility.  He was here for rehab after sustaining a left hip fracture subsequently repaired.  He also has a history of atrial fibrillation not on any aggressive anticoagulation because of his history of falls.  He also has a history of coronary artery disease with stenting in the past- listed ejection fraction of 45-50% on echo done in May 2019.  He also has a history of chronic bradycardia in addition to rheumatoid arthritis hypothyroidism hypertension chronic lower extremity edema.  He appears to have done well with his rehab but still requires a wheelchair frequently secondary to continued weakness and fall risk.  He was seen earlier this week for possibly some  increased lower extremity edema and we did increase his Lasix back up to 40 mg a day-at one point here he was on 60 mg for short course and then appeared to be reduced to 20 but he did have some increased edema and we put him back on his baseline of 40.  In regards to other issues these appear to be stable- he continues on methotrexate with a history of rheumatoid arthritis.  He is also on Synthroid with a history of hypothyroidism TSH has been within normal limits.  Blood pressure also appears to be well controlled and your reading of 138/60-previous reading 124/69  He also has mild memory loss and continues on Aricept- he is also on Timoptic eyedrops with a history of glaucoma.  Regards to coronary artery disease he been essentially asymptomatic during his stay here he is followed by cardiology.  Regards to the hip fracture he does receive Norco as needed for pain and Robaxin- he will have follow-up by orthopedics.  He is on low-dose aspirin twice daily for DVT prophylaxis until September 12 at which time  he will go back to aspirin 81 mg once a day  Currently vital signs are stable he says his hip pain is well controlled-.  Apparently his discharge is being prompted by insurance issues he will have some support at home- he does have a very involved daughter but she does have other commitments- she would prefer he stay a bit longer but apparently insurance appeals have been denied  Past Medical History:  Diagnosis Date  . A-fib ([place])   . Arthritis    osteoarthritis  . Basal cell carcinoma (BCC) 08/29/2017  . Cataract   . Frequent falls   . Glaucoma   . High cholesterol   . Hypertension   . MI (myocardial infarction) ([place])   . Rheumatoid arthritis ([place])   . Thyroid disease         Past Surgical History:  Procedure Laterality Date  . CORONARY ANGIOPLASTY WITH STENT PLACEMENT    . HIP ARTHROPLASTY Left 08/06/2018   Procedure: ARTHROPLASTY BIPOLAR HIP (HEMIARTHROPLASTY);  Surgeon: Paralee Cancel, MD;  Location: [facility];  Service: Orthopedics;  Laterality: Left;  . JOINT REPLACEMENT     left knee  . REPLACEMENT TOTAL KNEE Left          Allergies  Allergen Reactions  . Metoprolol     Prescribed for A. Fib 12/17-12/22/18 nocturnal heart rates in the high 30s and 40s, beta blocker discontinued      MEDICATIONS     Medication Sig  . acetaminophen (TYLENOL) 325 MG  tablet Take 650 mg by mouth every 6 (six) hours as needed.  Marland Kitchen aspirin (ASPIRIN CHILDRENS) 81 MG chewable tablet Chew 1 tablet (81 mg total) by mouth 2 (two) times daily. Take for 4 weeks, then resume regular dose.  Marland Kitchen aspirin EC 81 MG tablet Take 81 mg by mouth daily. Starting 09/06/2018  . cyanocobalamin 1000 MCG tablet Take 1,000 mcg by mouth daily.  Marland Kitchen donepezil (ARICEPT) 10 MG tablet Take 1 tablet (10 mg total) by mouth at bedtime.  . folic acid (FOLVITE) 1 MG tablet TAKE 1 TABLET BY MOUTH ONCE DAILY  . furosemide (LASIX) 40 MG tablet Take 40 mg by mouth daily.   Marland Kitchen HYDROcodone-acetaminophen  (NORCO) 5-325 MG tablet Take 1-2 tablets by mouth every 4 (four) hours as needed for moderate pain or severe pain.  Marland Kitchen levothyroxine (SYNTHROID, LEVOTHROID) 75 MCG tablet TAKE 1 TABLET BY MOUTH ONCE DAILY BEFORE BREAKFAST  . Magnesium 200 MG TABS Take 400 mg by mouth daily.  . methocarbamol (ROBAXIN) 500 MG tablet Take 1 tablet (500 mg total) by mouth every 6 (six) hours as needed for muscle spasms.  . methotrexate 2.5 MG tablet Take 15 mg by mouth every Sunday. 6 tablets on Sunday  . polycarbophil (FIBERCON) 625 MG tablet Take 625 mg by mouth at bedtime.  . polyethylene glycol (MIRALAX / GLYCOLAX) packet Take 17 g by mouth 2 (two) times daily.  . potassium chloride SA (K-DUR,KLOR-CON) 20 MEQ tablet Take 1 tablet (20 mEq total) by mouth 2 (two) times daily.  Marland Kitchen senna (SENOKOT) 8.6 MG tablet Take 1 tablet by mouth daily.  . simvastatin (ZOCOR) 40 MG tablet TAKE 1 TABLET BY MOUTH ONCE DAILY  . timolol (TIMOPTIC) 0.5 % ophthalmic solution 1 drop 2 (two) times daily.  Marland Kitchen triamcinolone (NASACORT ALLERGY 24HR) 55 MCG/ACT AERO nasal inhaler Place 1 spray into the nose daily.  . [DISCONTINUED] docusate sodium (COLACE) 100 MG capsule Take 1 capsule (100 mg total) by mouth 2 (two) times daily.  . [DISCONTINUED] ferrous sulfate 325 (65 FE) MG tablet Take 1 tablet (325 mg total) by mouth 2 (two) times daily with a meal.  . [DISCONTINUED] Melatonin 3 MG TABS Take 3 mg by mouth at bedtime.   Review of systems.  General is not complaining of fever chills   Skin is not complain of rashes or itching  Head ears eyes nose mouth and throat has prescription lenses does not complain of visual changes or sore throat or difficulty swallowing  Resp-continues to deny shortness of breath or cough.  Cardiac does not complain of chest pain has  lower extremity edema more on his left side which is a surgical site  GI is not complaining of abdominal pain nausea vomiting diarrhea constipation.  GU continues to deny  dysuria.  Musculoskeletal says his joint pain hip pain currently is controlled he does receive Norco as needed as well as Robaxin as needed.  Neurologic is not complaining of dizziness headache or numbness.  And psych appears to have some mild cognitive deficits but continues to be very pleasant does not complain of being depressed or anxious.  Physical exam. He is afebrile pulse of 54 respirations of 16 blood pressure manually 138/70 weight -again appears to fluctuate from 176.8-181.4  General this is a very pleasant elderly male in no distress sitting comfortably in his wheelchair.  His skin is warm and dry surgical site left hip appears to have some well-healed crusting I do not see sign of infection.  Eyes  visual acuity appears to be intact he has prescription lenses.  Oropharynx is clear mucous membranes moist.  Chest is clear to auscultation there is no labored breathing.  Heart is regular irregular rhythm slightly bradycardic again in the 50s-continues with moderate lower extremity edema more on the left the surgical site versus the right pedal pulses are palpable bilaterally but somewhat reduced because of the edema-. Edema is cool to touch,non tender and not  Erythematous   Abdomen is soft nontender with positive bowel sounds.  Musculoskeletal does move all extremities x4 is able to stand without assistance and use his walker and does fairly well with this but will need continued therapy.  Neurologic is grossly intact his speech is clear no lateralizing findings.  Psych he is oriented to self pleasant appropriate is conversational his cognitive status is mildly impaired but he is functional  Labs.  August 24, 2018.  Sodium 138 potassium 4.1 BUN 8 creatinine 0.81  August 23, 2018.  WBC 7.9 hemoglobin 13.0 platelets317    Assessment and plan.  1.  History of left hip fracture with repair- appears to be making a decent recovery from this still has  weakness and  often ambulates in wheelchair he will need a wheelchair upon discharge also will need continued PT and OT- he does receive Norco and Robaxin as needed for pain and muscle spasms he appears to be stable in this regards and will need orthopedic follow-up  Continues on aspirin 81 mg twice daily through September 12 --this is for DVT prophylaxis- on September 13 recommendation is to reduce to aspirin 81 mg once a day He does continue to have some increased edema on his left despite the increase in Lasix-- that appears fairly persistent pedal pulse is intact it is not erythematous nontender but will order a venous Doppler just to make sure there is not a clot although I suspect the likelihood is low  2--history of atrial fibrillation not on anticoagulation other than aspirin because of his history of falls  #3 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- and appears to be well tolerated   4- coronary artery disease he is followed by cardiology this is been essentially asymptomatic during his stay here  #5- history of lower extremity edema- he has had some during his stay here again we have made Lasix adjustments currently on his baseline dose of 40 mg a day--he came in on this dose and then was increased to 60 for about 3 days-and then appears to have been reduced down to 20 for several days we have increased this again back up to 40- metabolic panel looks stable baseline potassium dose was 20 mEq twice a day will change current potassium dose to that he will need follow-up next week BMP to ensure stability of electrolytes   #6 history of CHF-with ejection fraction 45--50% again he is on Lasix he will need follow-up by primary care provider weights have fluctuated more from mid 170s up to 180.  7.-  History of hypothyroidism TSH has been within normal limits on Synthroid.  8.-History of memory loss appears to be quite mild he is on Aricept.  9.  History of glaucoma-continues on topical  eyedrops.  Again he did a metabolic panel drawn by home health as week with primary care provider notified of results.  He will need continued PT and OT as well as nursing support for his numerous medical issues-- orthopedic follow-up and primary care follow-up.  He continues to need a  wheelchair for continued weakness and fall risk  He will have some support at home- he states he will have someone with him most of the time but apparently not 24 hours a day- again apparently insurance appeals to stay have been denied  [id number]-of note greater than 30 minutes spent on this discharge summary- greater than 50% of time spent coordinating a plan of care for numerous diagnoses

## 2018-08-27 ENCOUNTER — Other Ambulatory Visit (HOSPITAL_COMMUNITY)
Admission: RE | Admit: 2018-08-27 | Discharge: 2018-08-27 | Disposition: A | Discharge status: Home / Self Care | Payer: Medicare PPO | Source: Other Acute Inpatient Hospital | Attending: Gastroenterology | Admitting: Gastroenterology

## 2018-08-27 DIAGNOSIS — R7989 Other specified abnormal findings of blood chemistry: Secondary | ICD-10-CM | POA: Insufficient documentation

## 2018-08-27 DIAGNOSIS — I110 Hypertensive heart disease with heart failure: Secondary | ICD-10-CM

## 2018-08-27 DIAGNOSIS — I4891 Unspecified atrial fibrillation: Secondary | ICD-10-CM | POA: Diagnosis not present

## 2018-08-27 DIAGNOSIS — S72002D Fracture of unspecified part of neck of left femur, subsequent encounter for closed fracture with routine healing: Secondary | ICD-10-CM | POA: Diagnosis not present

## 2018-08-27 DIAGNOSIS — R001 Bradycardia, unspecified: Secondary | ICD-10-CM

## 2018-08-27 DIAGNOSIS — I2510 Atherosclerotic heart disease of native coronary artery without angina pectoris: Secondary | ICD-10-CM | POA: Diagnosis not present

## 2018-08-27 LAB — BASIC METABOLIC PANEL
ANION GAP: 8 (ref 5–15)
BUN: 12 mg/dL (ref 8–23)
CHLORIDE: 99 mmol/L (ref 98–111)
CO2: 29 mmol/L (ref 22–32)
Calcium: 8.6 mg/dL — ABNORMAL LOW (ref 8.9–10.3)
Creatinine, Ser: 0.83 mg/dL (ref 0.61–1.24)
GFR calc non Af Amer: >60 mL/min (ref >60)
Glucose, Bld: 120 mg/dL — ABNORMAL HIGH (ref 70–99)
POTASSIUM: 3.3 mmol/L — AB (ref 3.5–5.1)
Sodium: 136 mmol/L (ref 135–145)

## 2018-09-02 ENCOUNTER — Telehealth: Payer: Self-pay | Admitting: Cardiovascular Disease

## 2018-09-02 NOTE — Telephone Encounter (Signed)
Calling due to patient having 2+ pitting edema to LE. Currently taking 40mg  Lasix. Wants to know if that needs to be increased. / tg

## 2018-09-02 NOTE — Telephone Encounter (Signed)
Will forward to Dr. Koneswaran  

## 2018-09-02 NOTE — Telephone Encounter (Signed)
Increase KCl to 40 meq bid x 4 days while doubling up on Lasix.

## 2018-09-02 NOTE — Telephone Encounter (Signed)
Increase Lasix to 40 mg bid x 4 days. Start KCl 20 meq daily. Check BMET in 2 days.

## 2018-09-03 MED ORDER — POTASSIUM CHLORIDE CRYS ER 20 MEQ PO TBCR: 20.0000 meq | EXTENDED_RELEASE_TABLET | Freq: Every day | ORAL | 3 refills | Status: AC

## 2018-09-03 NOTE — Telephone Encounter (Signed)
I spoke with Larene Beach , [facility] and she will make lasix and potassium changes and repeat bmet in 2 weeks

## 2018-09-09 ENCOUNTER — Telehealth: Payer: Self-pay | Admitting: Cardiovascular Disease

## 2018-09-09 NOTE — Telephone Encounter (Signed)
I will inform Dr.Koneswaran and call [facility]

## 2018-09-09 NOTE — Telephone Encounter (Signed)
Colletta Maryland w/ [facility] called stating pt is having some swelling and would like orders for him to have compression stockings   Please give her a call @ [phone]

## 2018-09-10 NOTE — Telephone Encounter (Signed)
That would be fine 

## 2018-09-10 NOTE — Telephone Encounter (Signed)
[facility] notified

## 2018-10-04 ENCOUNTER — Other Ambulatory Visit: Payer: Self-pay | Admitting: Family Medicine

## 2018-10-07 ENCOUNTER — Other Ambulatory Visit: Payer: Self-pay | Admitting: Adult Health

## 2018-10-07 ENCOUNTER — Other Ambulatory Visit: Payer: Self-pay | Admitting: Internal Medicine

## 2018-10-23 ENCOUNTER — Other Ambulatory Visit (HOSPITAL_COMMUNITY): Payer: Self-pay | Admitting: Nurse Practitioner

## 2018-10-23 DIAGNOSIS — M8580 Other specified disorders of bone density and structure, unspecified site: Secondary | ICD-10-CM

## 2018-10-28 ENCOUNTER — Other Ambulatory Visit: Payer: Self-pay

## 2018-10-28 DIAGNOSIS — R0989 Other specified symptoms and signs involving the circulatory and respiratory systems: Secondary | ICD-10-CM

## 2018-11-01 ENCOUNTER — Ambulatory Visit (HOSPITAL_COMMUNITY)
Admission: RE | Admit: 2018-11-01 | Discharge: 2018-11-01 | Disposition: A | Discharge status: Home / Self Care | Payer: Medicare PPO | Source: Ambulatory Visit | Attending: Nurse Practitioner | Admitting: Nurse Practitioner

## 2018-11-01 DIAGNOSIS — M8589 Other specified disorders of bone density and structure, multiple sites: Secondary | ICD-10-CM | POA: Diagnosis not present

## 2018-11-01 DIAGNOSIS — M8580 Other specified disorders of bone density and structure, unspecified site: Secondary | ICD-10-CM

## 2018-11-01 DIAGNOSIS — Z8781 Personal history of (healed) traumatic fracture: Secondary | ICD-10-CM | POA: Diagnosis present

## 2018-11-25 ENCOUNTER — Ambulatory Visit: Payer: Medicare PPO | Admitting: Vascular Surgery

## 2018-11-25 ENCOUNTER — Encounter: Payer: Self-pay | Admitting: Vascular Surgery

## 2018-11-25 ENCOUNTER — Ambulatory Visit (HOSPITAL_COMMUNITY)
Admission: RE | Admit: 2018-11-25 | Discharge: 2018-11-25 | Disposition: A | Discharge status: Home / Self Care | Payer: Medicare PPO | Source: Ambulatory Visit | Attending: Vascular Surgery | Admitting: Vascular Surgery

## 2018-11-25 VITALS — BP 123/51 | HR 39 | Resp 16 | Ht 70.0 in | Wt 182.0 lb

## 2018-11-25 DIAGNOSIS — R0989 Other specified symptoms and signs involving the circulatory and respiratory systems: Secondary | ICD-10-CM | POA: Diagnosis not present

## 2018-11-25 DIAGNOSIS — M7989 Other specified soft tissue disorders: Secondary | ICD-10-CM | POA: Diagnosis not present

## 2018-11-25 NOTE — Progress Notes (Signed)
[facility]in [facility]ist [facility]  Patient name: [name] MRN: [id number] DOB: 20-Jan-1941 Sex: male  REASON FOR CONSULT: Evaluation of lower extremities for potential arterial insufficiency  Seen today in our [place] office  HPI: [name] is a 77 y.o. male, who is here today with his daughter for evaluation of lower extremities.  Here today with his daughter who is his primary caregiver.  He has a long history of bilateral lower extremity swelling and she was concerned that this potentially could put him at risk for more serious complications.  He does have an ulceration on the plantar aspect of his right foot and is being seen by the podiatrist for this.  He does have dementia and his daughter is hoping to keep him at home as long as possible.  Has been instructed on leg elevation but is not compliant with this.  Does not have any claudication type symptoms.  Past Medical History:  Diagnosis Date  . A-fib ([place])   . Arthritis    osteoarthritis  . Basal cell carcinoma (BCC) 08/29/2017  . Cataract   . Frequent falls   . Glaucoma   . High cholesterol   . Hypertension   . MI (myocardial infarction) ([place])   . Rheumatoid arthritis ([place])   . Thyroid disease     Family History  Problem Relation Age of Onset  . Heart attack Father 47  .  death Father   . Heart disease Father   . Alzheimer's disease Mother 13  . Heart disease Mother   .  death Sister        MVA  . Hyperlipidemia Brother   . Heart disease Brother     SOCIAL HISTORY: Social History   Socioeconomic History  . Marital status: Widowed    Spouse name: Not on file  . Number of children: 1  . Years of education: 79  . Highest education level: Not on file  Occupational History  . Occupation: retired    Comment: Psychologist, counselling and first aid equip  Social Needs  . Financial resource strain: Not on file  . Food insecurity:    Worry: Not on file   Inability: Not on file  . Transportation needs:    Medical: Not on file    Non-medical: Not on file  Tobacco Use  . Smoking status: Former Smoker    Years: 15.00    Types: Cigarettes    Last attempt to quit: 12/26/1975    Years since quitting: 42.9  . Smokeless tobacco: Never Used  Substance and Sexual Activity  . Alcohol use: No  . Drug use: No  . Sexual activity: Not Currently  Lifestyle  . Physical activity:    Days per week: Not on file    Minutes per session: Not on file  . Stress: Not on file  Relationships  . Social connections:    Talks on phone: Not on file    Gets together: Not on file    Attends religious service: Not on file    Active member of club or organization: Not on file    Attends meetings of clubs or organizations: Not on file    Relationship status: Not on file  . Intimate partner violence:    Fear of current or ex partner: Not on file    Emotionally abused: Not on file    Physically abused: Not on file    Forced sexual activity: Not on file  Other Topics Concern  .  Not on file  Social History Narrative   Forensic psychologist    Widow   Lives alone   Lives near [name]/daughter    Allergies  Allergen Reactions  . Metoprolol     Prescribed for A. Fib 12/17-12/22/18 nocturnal heart rates in the high 30s and 40s, beta blocker discontinued    Current Outpatient Medications  Medication Sig Dispense Refill  . aspirin EC 81 MG tablet Take 81 mg by mouth daily. Starting 09/06/2018    . cyanocobalamin 1000 MCG tablet Take 1,000 mcg by mouth daily.    . folic acid (FOLVITE) 1 MG tablet TAKE 1 TABLET BY MOUTH ONCE DAILY 90 tablet 1  . furosemide (LASIX) 40 MG tablet Take 40 mg by mouth daily.     . furosemide (LASIX) 40 MG tablet TAKE 1 TABLET BY MOUTH ONCE DAILY AS NEEDED 30 tablet 2  . Magnesium 200 MG TABS Take 400 mg by mouth daily.    . methotrexate 2.5 MG tablet Take 15 mg by mouth every Sunday. 6 tablets on Sunday    . polycarbophil (FIBERCON) 625  MG tablet Take 625 mg by mouth at bedtime.    . potassium chloride SA (K-DUR,KLOR-CON) 20 MEQ tablet Take 1 tablet (20 mEq total) by mouth daily. (Patient taking differently: Take 20 mEq by mouth 2 (two) times daily. ) 90 tablet 3  . simvastatin (ZOCOR) 40 MG tablet TAKE 1 TABLET BY MOUTH ONCE DAILY 90 tablet 0  . timolol (TIMOPTIC) 0.5 % ophthalmic solution 1 drop 2 (two) times daily.    Marland Kitchen triamcinolone (NASACORT ALLERGY 24HR) 55 MCG/ACT AERO nasal inhaler Place 1 spray into the nose daily.    Marland Kitchen acetaminophen (TYLENOL) 325 MG tablet Take 650 mg by mouth every 6 (six) hours as needed.    . donepezil (ARICEPT) 10 MG tablet Take 1 tablet (10 mg total) by mouth at bedtime. (Patient not taking: Reported on 11/25/2018) 30 tablet 11  . HYDROcodone-acetaminophen (NORCO) 5-325 MG tablet Take 1-2 tablets by mouth every 4 (four) hours as needed for moderate pain or severe pain. (Patient not taking: Reported on 11/25/2018) 60 tablet 0  . levothyroxine (SYNTHROID, LEVOTHROID) 75 MCG tablet TAKE 1 TABLET BY MOUTH ONCE DAILY BEFORE BREAKFAST (Patient not taking: Reported on 11/25/2018) 90 tablet 0  . Melatonin 3 MG TABS melatonin 3 mg tablet   3 mg by oral route.    . methocarbamol (ROBAXIN) 500 MG tablet Take 1 tablet (500 mg total) by mouth every 6 (six) hours as needed for muscle spasms. (Patient not taking: Reported on 11/25/2018) 40 tablet 0  . NP THYROID 60 MG tablet Take 60 mg by mouth daily.  3  . polyethylene glycol (MIRALAX / GLYCOLAX) packet Take 17 g by mouth 2 (two) times daily. (Patient not taking: Reported on 11/25/2018) 14 each 0  . senna (SENOKOT) 8.6 MG tablet Take 1 tablet by mouth daily.     No current facility-administered medications for this visit.     REVIEW OF SYSTEMS:  [X]  denotes positive finding, [ ]  denotes negative finding Cardiac  Comments:  Chest pain or chest pressure:    Shortness of breath upon exertion:    Short of breath when lying flat:    Irregular heart rhythm:          Vascular    Pain in calf, thigh, or hip brought on by ambulation:    Pain in feet at night that wakes you up from your sleep:  Blood clot in your veins:    Leg swelling:         Pulmonary    Oxygen at home:    Productive cough:     Wheezing:         Neurologic    Sudden weakness in arms or legs:     Sudden numbness in arms or legs:     Sudden onset of difficulty speaking or slurred speech:    Temporary loss of vision in one eye:     Problems with dizziness:         Gastrointestinal    Blood in stool:     Vomited blood:         Genitourinary    Burning when urinating:     Blood in urine:        Psychiatric    Major depression:         Hematologic    Bleeding problems:    Problems with blood clotting too easily:        Skin    Rashes or ulcers:        Constitutional    Fever or chills:      PHYSICAL EXAM: Vitals:   11/25/18 1451  BP: (!) 123/51  Pulse: (!) 39  Resp: 16  Weight: 182 lb (82.6 kg)  Height: 5\' 10"  (1.778 m)    GENERAL: The patient is a well-nourished male, in no acute distress. The vital signs are documented above. CARDIOVASCULAR: 2+ radial pulses bilaterally.  He has a 2+ right dorsalis pedis and a 1+ left posterior tibial pulse PULMONARY: There is good air exchange  ABDOMEN: Soft and non-tender  MUSCULOSKELETAL: There are no major deformities or cyanosis. NEUROLOGIC: No focal weakness or paresthesias are detected. SKIN: He does have a callus on the plantar aspect of his right foot with no evidence of infection PSYCHIATRIC: The patient has a normal affect.  DATA:  Noninvasive vascular lab studies from [facility] were reviewed with the patient and his daughter.  This shows normal ankle arm index and normal triphasic waveforms bilaterally  MEDICAL ISSUES: I discussed the findings with patient and his daughter.  I explained that he has no evidence of arterial insufficiency and so therefore would be at no risk for limb loss.  He  does have chronic lower extremity swelling and explained the only options for treatment would be diuresis, elevation and compression which is been very difficult to him to comply with.  Not have any changes of venous stasis disease so has not had any stage of venous hypertension.  Reassured with this discussion will see [place] again on an as needed basis.   Rosetta Posner, MD FACS [facility] Tel [phone] Pager [phone]

## 2019-01-01 ENCOUNTER — Telehealth: Payer: Self-pay | Admitting: Neurology

## 2019-01-01 ENCOUNTER — Ambulatory Visit: Payer: Medicare PPO | Admitting: Neurology

## 2019-01-25 NOTE — Telephone Encounter (Signed)
Daughter just called to inform moments ago pt passed

## 2019-01-25 DEATH — deceased

## 2019-03-10 IMAGING — DX DG SHOULDER 2+V*R*
2 series · 2 of 2 positions shown · non-contrast
Comparison: None.

CLINICAL DATA: Fall.  Right shoulder pain.

EXAM:
RIGHT SHOULDER - 2+ VIEW

[shoulder grashey]
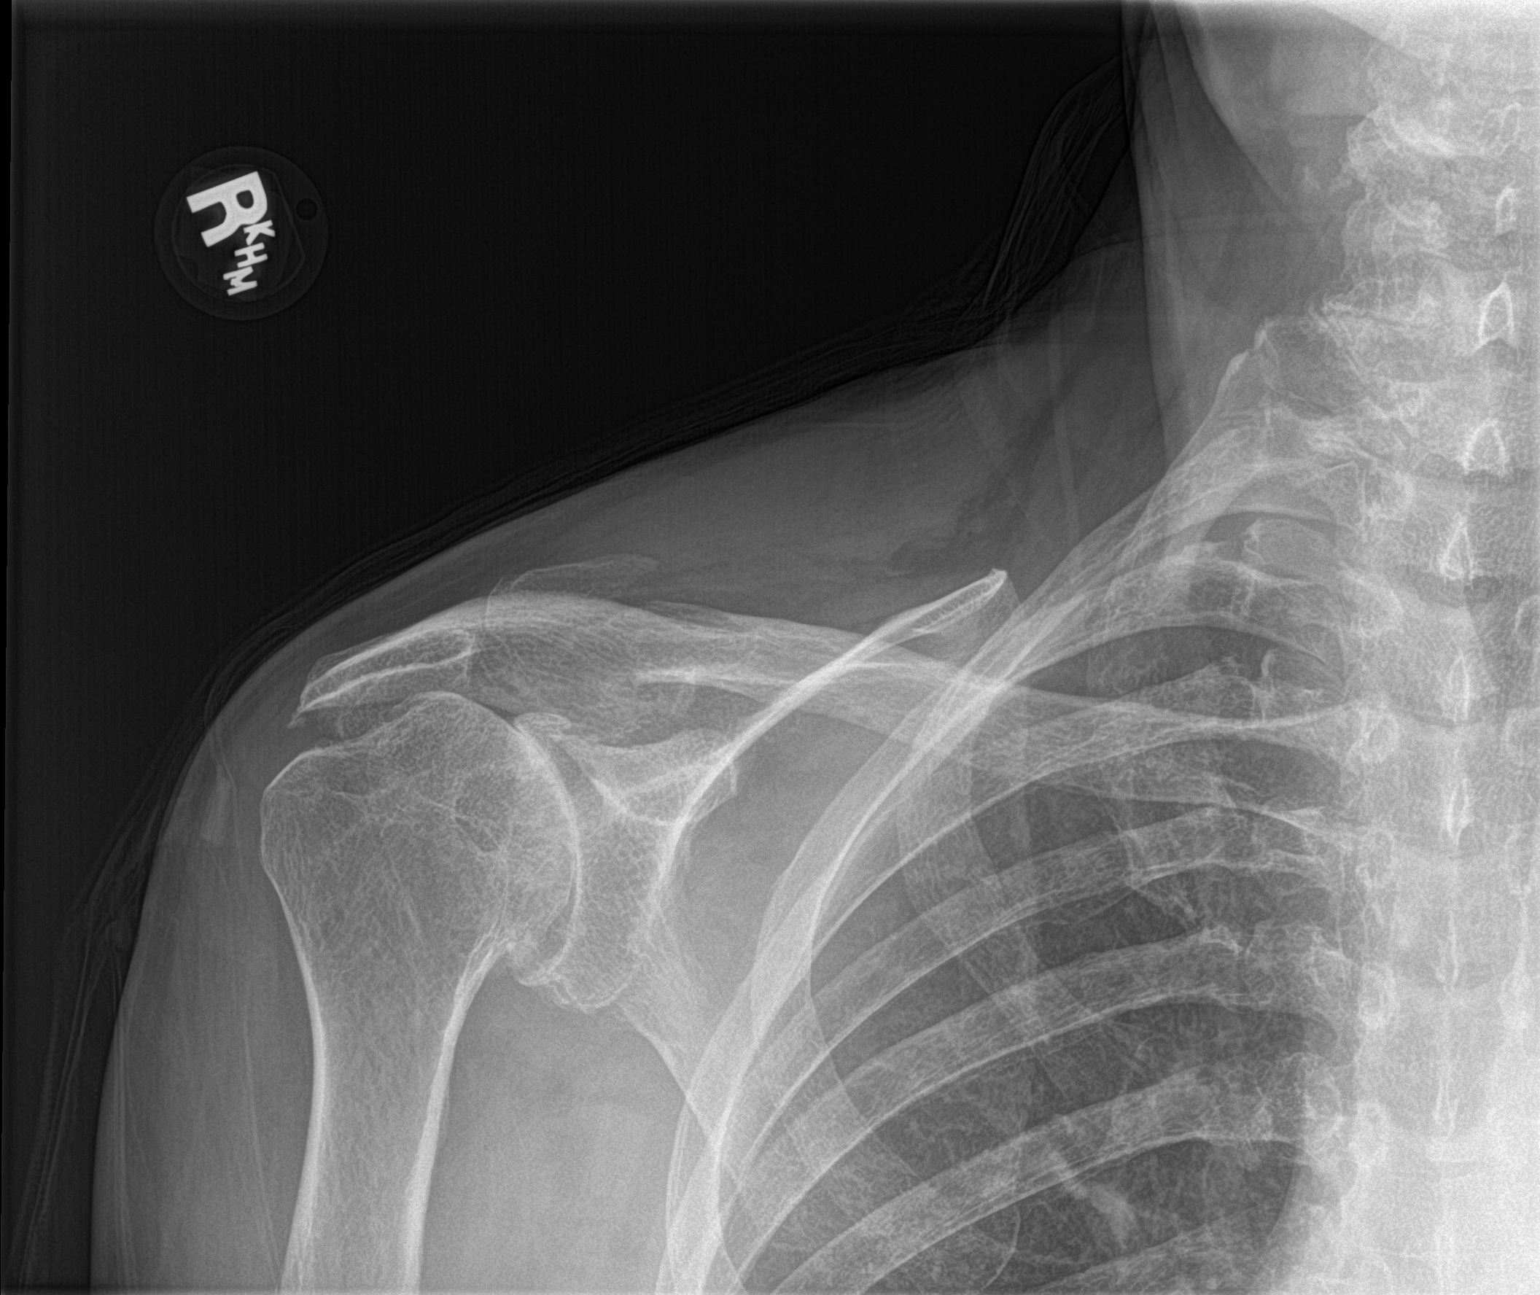

[shoulder y view]
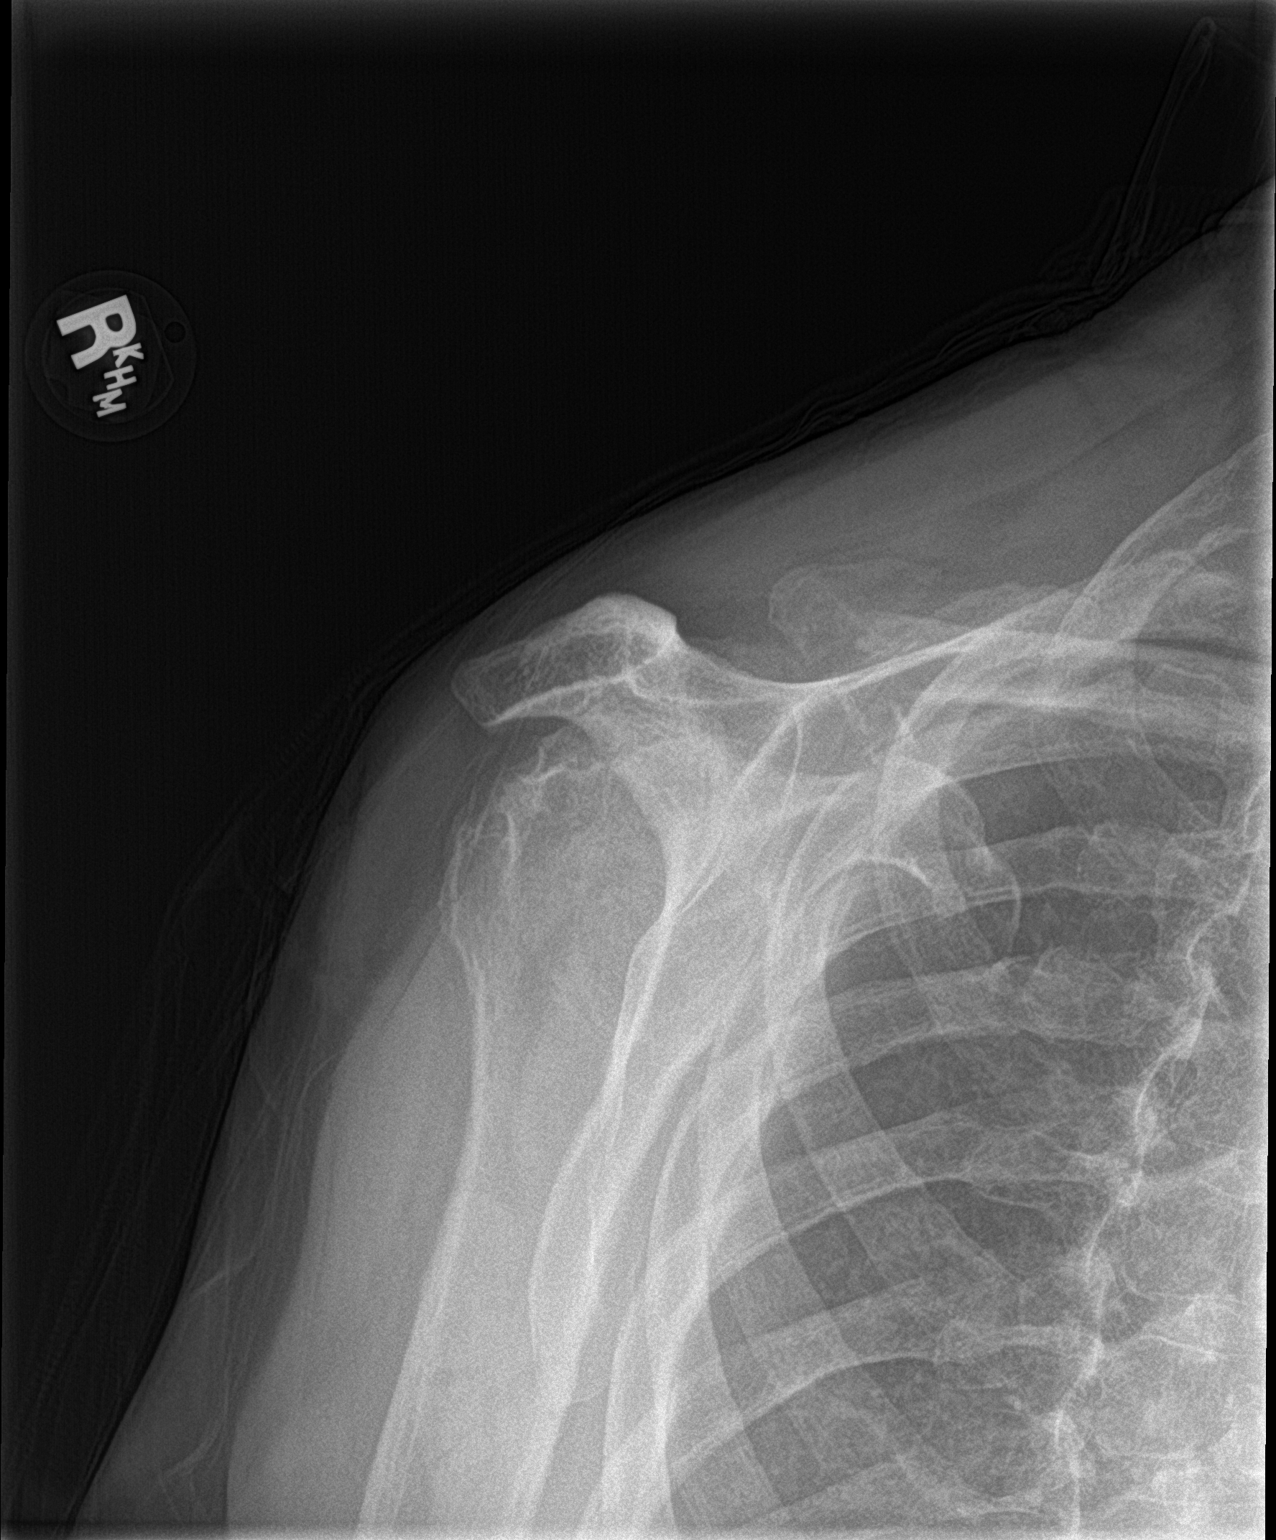

[2 of 2 positions shown; findings below may reference images not displayed]

FINDINGS: There is an oblique fracture through the lateral right clavicle,
which probably extends to the articular surface, with 7 mm superior
displacement of the dominant lateral fracture fragment and
surrounding soft tissue swelling. No additional fracture. No
evidence of dislocation at the right glenohumeral joint. Severe
osteoarthritis in the right glenohumeral joint. Small subacromial
spur. No suspicious focal osseous lesion. No radiopaque foreign
body.
IMPRESSION: 1. Displaced fracture of the lateral right clavicle, probably
extending laterally to the articular surface.
2. Severe osteoarthritis in the right glenohumeral joint.

## 2019-04-26 IMAGING — CT CT HEAD W/O CM
5 of 7 series · 17 of 47 positions shown, 18 images · non-contrast
Comparison: None.

CLINICAL DATA: Fell and hit back of head

EXAM:
CT HEAD WITHOUT CONTRAST
CT CERVICAL SPINE WITHOUT CONTRAST
TECHNIQUE: Multidetector CT imaging of the head and cervical spine was
performed following the standard protocol without intravenous
contrast. Multiplanar CT image reconstructions of the cervical spine
were also generated.

[Series 3: head wo · axial · 0.45mm/px · z∈[+1832,+1912]mm · 3 of 34 slices shown, 4 images]
[im 9/34  brain]
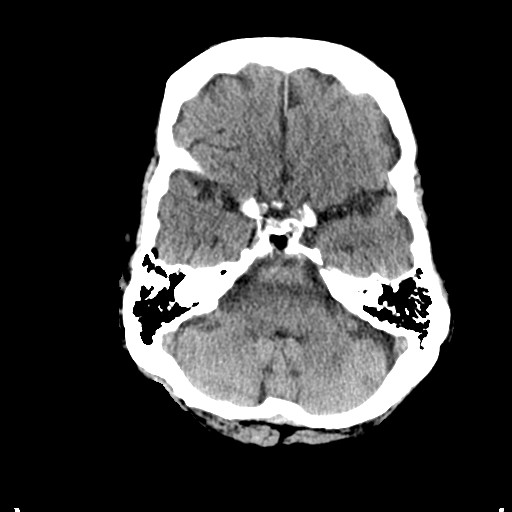
[im 9/34  bone]
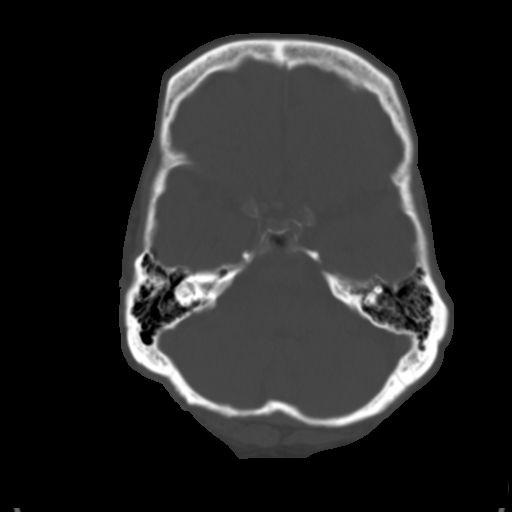
[im 17/34  brain]
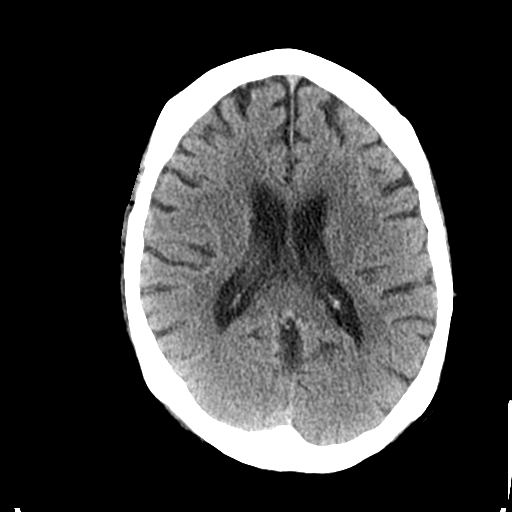
[im 25/34  brain]
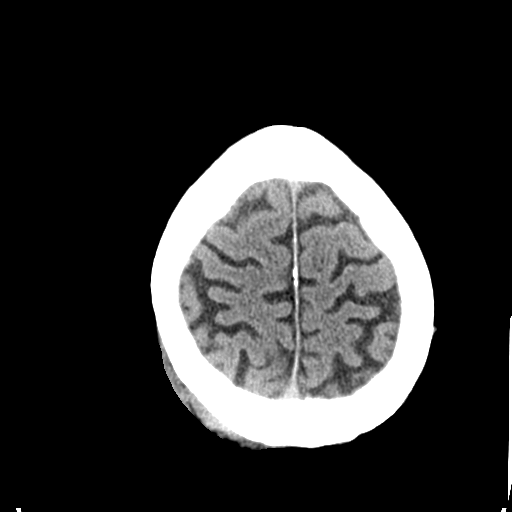

[Series 5: coronal soft tissue · coronal · 0.33mm/px · 3 of 73 slices shown]
[im 21/73  brain]
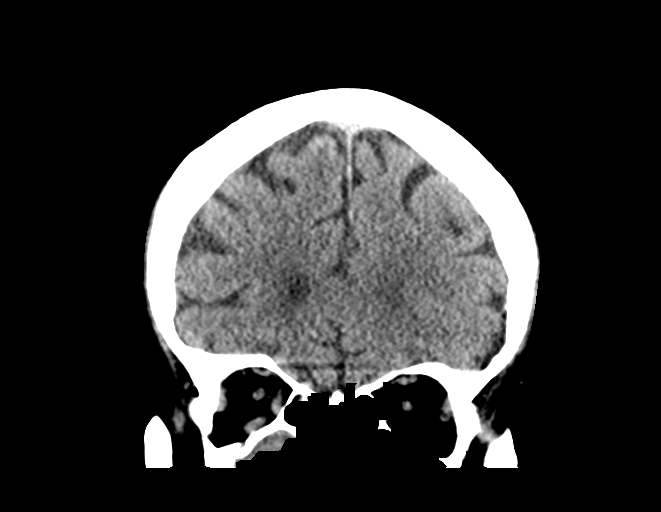
[im 31/73  brain]
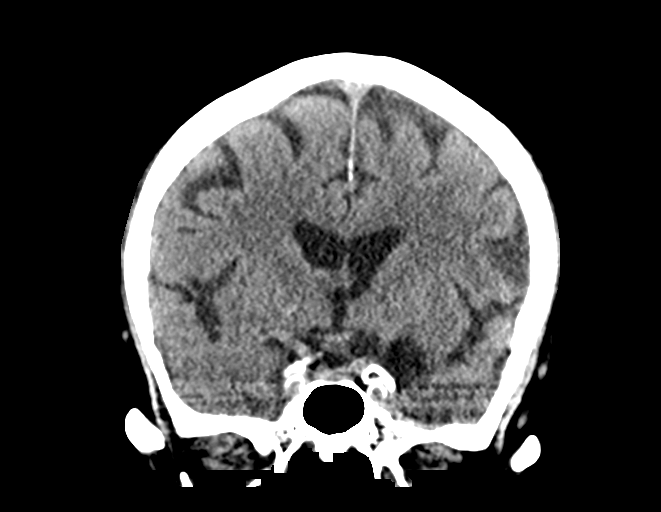
[im 42/73  brain]
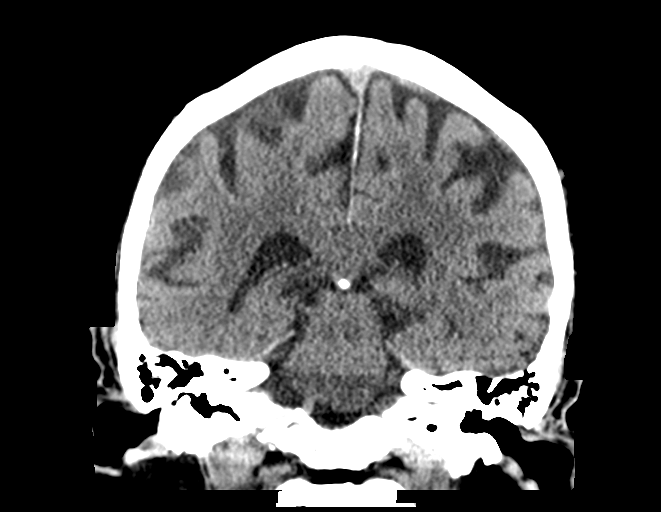

[Series 6: sagittal soft tissue · sagittal · 0.33mm/px · 1 of 61 slices shown]
[im 31/61  brain]
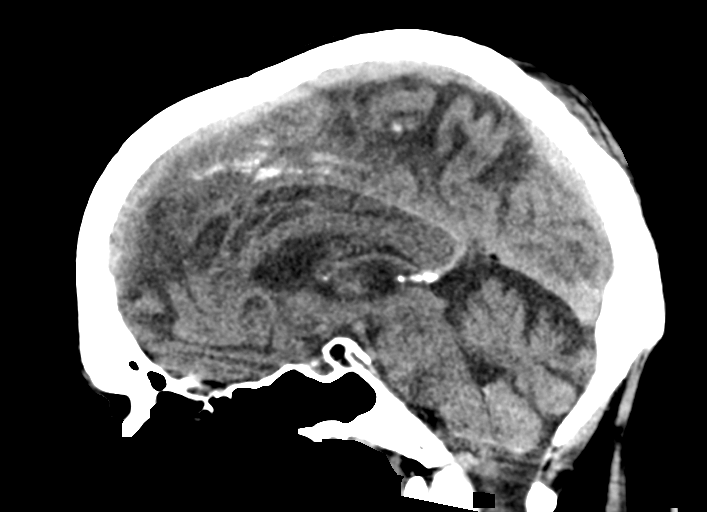

[Series 8: c spine soft · axial · 0.32mm/px · z∈[+1626,+1642]mm · 2 of 94 slices shown]
[im 9/94  brain]
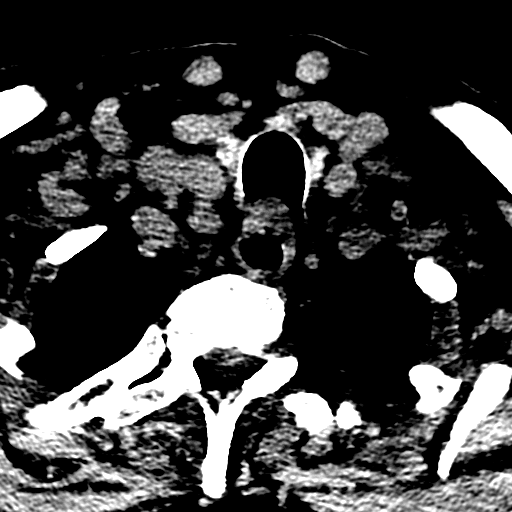
[im 17/94  brain]
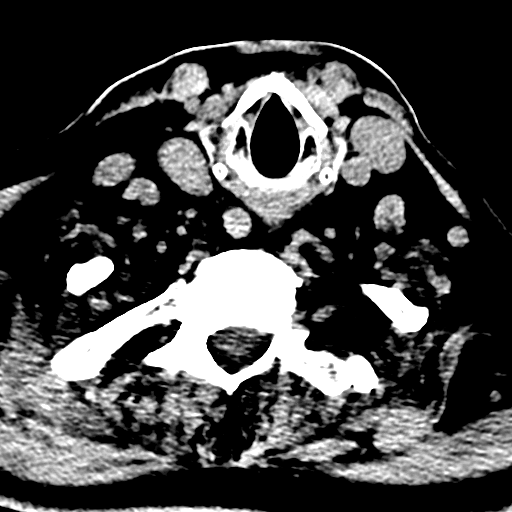

[Series 11: orthogonal bone · axial · 0.21mm/px · z∈[+1618,+1768]mm · 8 of 102 slices shown]
[im 9/102  bone]
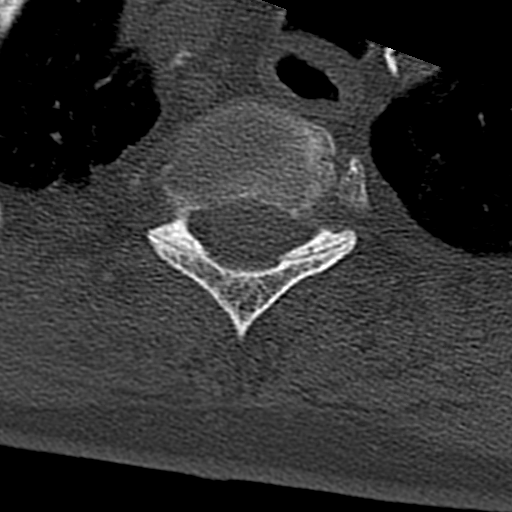
[im 26/102  bone]
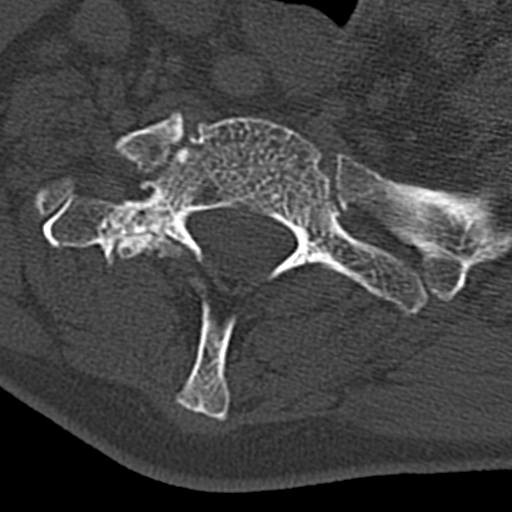
[im 34/102  bone]
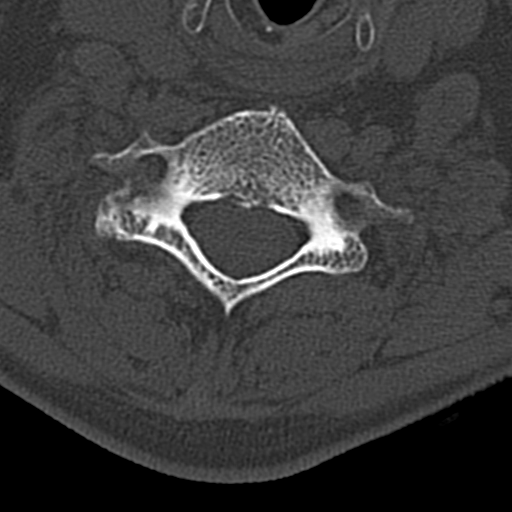
[im 43/102  bone]
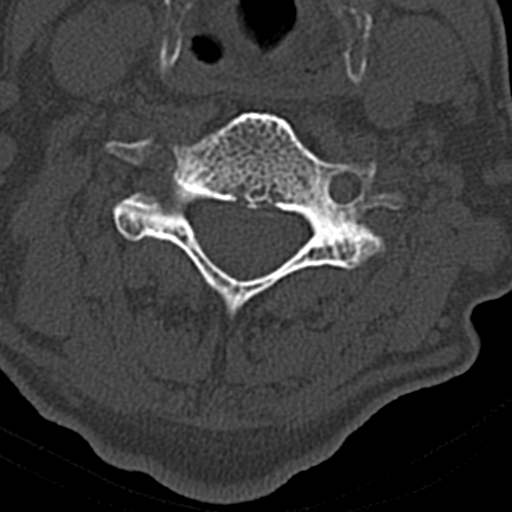
[im 59/102  bone]
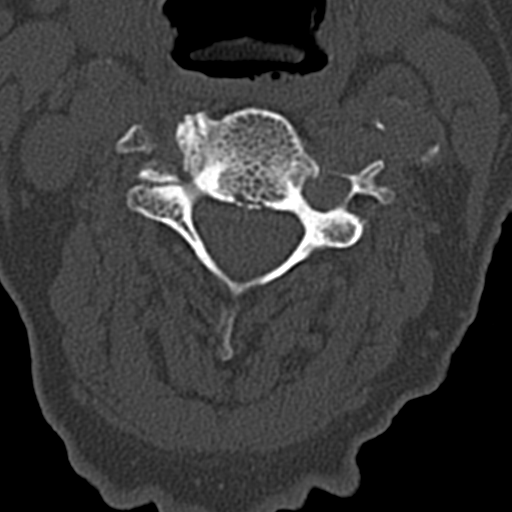
[im 68/102  bone]
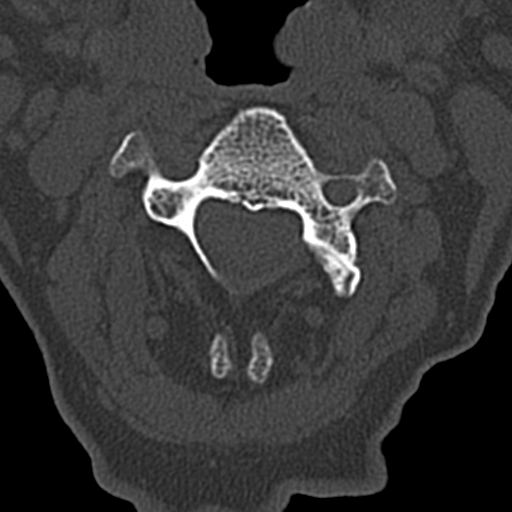
[im 76/102  bone]
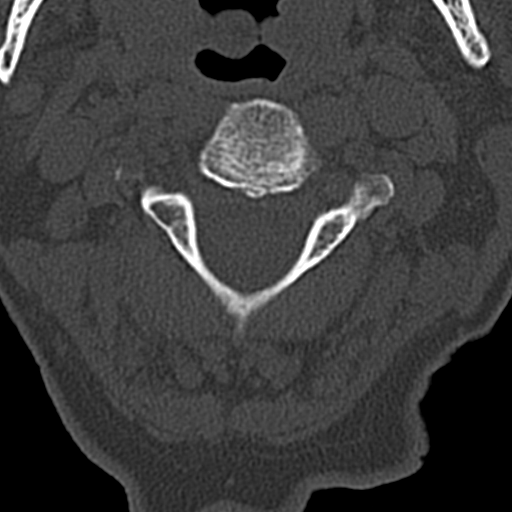
[im 93/102  bone]
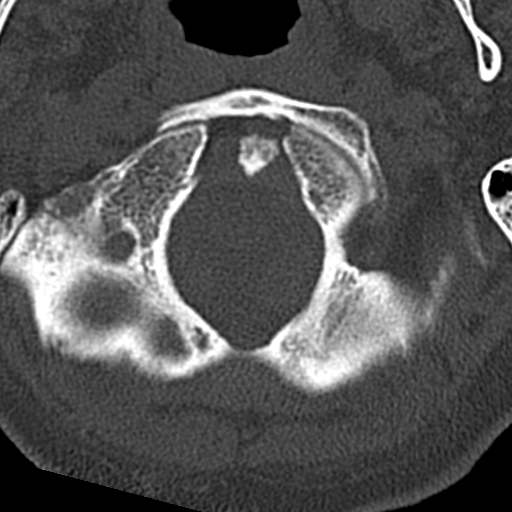

[17 of 47 positions shown; findings below may reference images not displayed]

FINDINGS: CT HEAD FINDINGS

Brain: No acute territorial infarction, hemorrhage or intracranial
mass is seen. Mild atrophy. Ventricle size within normal limits.

Vascular: No hyperdense vessels.  Carotid artery calcifications.

Skull: No fracture or suspicious bone lesions

Sinuses/Orbits: Complete opacification of right maxillary sinus with
bony remottling. Mucosal thickening in the ethmoid sinuses. No acute
orbital abnormality. Bilateral lens extraction.

Other: Large right parietal scalp hematoma.

CT CERVICAL SPINE FINDINGS

Alignment: Trace retrolisthesis of C3 on C4 and C4 on C5. Facet
alignment within normal limits.

Skull base and vertebrae: Craniovertebral junction is intact. There
is no fracture identified

Soft tissues and spinal canal: No prevertebral fluid or swelling. No
visible canal hematoma.

Disc levels: Multilevel degenerative disc changes, moderate to
marked at C3-C4, C4-C5, C5-C6 and C6-C7. Multilevel bilateral facet
arthropathy with multilevel foraminal stenosis between C3 and C6.

Upper chest: Lung apices clear.  No thyroid mass.

Other: None
IMPRESSION: 1. Large right parietal scalp hematoma. No definite CT evidence for
acute intracranial abnormality
2. Trace retrolisthesis of C3 on C4 and C4 on C5, probably
degenerative. No acute fracture. Multilevel degenerative disc
changes.
